# Patient Record
Sex: Male | Born: 1950 | Race: White | Hispanic: No | Smoking: Former smoker
Health system: Southern US, Community
[De-identification: ages and names within clinical notes are randomized; demographics above are authoritative.]

## PROBLEM LIST (undated history)

## (undated) DIAGNOSIS — G629 Polyneuropathy, unspecified: Secondary | ICD-10-CM

## (undated) DIAGNOSIS — G4733 Obstructive sleep apnea (adult) (pediatric): Secondary | ICD-10-CM

## (undated) DIAGNOSIS — N419 Inflammatory disease of prostate, unspecified: Secondary | ICD-10-CM

## (undated) DIAGNOSIS — K2270 Barrett's esophagus without dysplasia: Secondary | ICD-10-CM

## (undated) DIAGNOSIS — N400 Enlarged prostate without lower urinary tract symptoms: Secondary | ICD-10-CM

## (undated) DIAGNOSIS — R600 Localized edema: Secondary | ICD-10-CM

## (undated) DIAGNOSIS — G5600 Carpal tunnel syndrome, unspecified upper limb: Secondary | ICD-10-CM

## (undated) DIAGNOSIS — K219 Gastro-esophageal reflux disease without esophagitis: Secondary | ICD-10-CM

## (undated) DIAGNOSIS — F32A Depression, unspecified: Secondary | ICD-10-CM

## (undated) DIAGNOSIS — N529 Male erectile dysfunction, unspecified: Secondary | ICD-10-CM

## (undated) DIAGNOSIS — F419 Anxiety disorder, unspecified: Secondary | ICD-10-CM

## (undated) DIAGNOSIS — M4800 Spinal stenosis, site unspecified: Secondary | ICD-10-CM

## (undated) DIAGNOSIS — I8390 Asymptomatic varicose veins of unspecified lower extremity: Secondary | ICD-10-CM

## (undated) DIAGNOSIS — M1990 Unspecified osteoarthritis, unspecified site: Secondary | ICD-10-CM

## (undated) DIAGNOSIS — Z973 Presence of spectacles and contact lenses: Secondary | ICD-10-CM

## (undated) DIAGNOSIS — I10 Essential (primary) hypertension: Secondary | ICD-10-CM

## (undated) DIAGNOSIS — R3129 Other microscopic hematuria: Secondary | ICD-10-CM

## (undated) DIAGNOSIS — E559 Vitamin D deficiency, unspecified: Secondary | ICD-10-CM

## (undated) DIAGNOSIS — E785 Hyperlipidemia, unspecified: Secondary | ICD-10-CM

## (undated) DIAGNOSIS — Z8719 Personal history of other diseases of the digestive system: Secondary | ICD-10-CM

## (undated) DIAGNOSIS — I739 Peripheral vascular disease, unspecified: Secondary | ICD-10-CM

## (undated) DIAGNOSIS — F329 Major depressive disorder, single episode, unspecified: Secondary | ICD-10-CM

## (undated) HISTORY — DX: Polyneuropathy, unspecified: G62.9

## (undated) HISTORY — PX: FOOT SURGERY: SHX648

## (undated) HISTORY — DX: Carpal tunnel syndrome, unspecified upper limb: G56.00

## (undated) HISTORY — DX: Vitamin D deficiency, unspecified: E55.9

## (undated) HISTORY — PX: EYE SURGERY: SHX253

## (undated) HISTORY — DX: Depression, unspecified: F32.A

## (undated) HISTORY — DX: Obstructive sleep apnea (adult) (pediatric): G47.33

## (undated) HISTORY — DX: Male erectile dysfunction, unspecified: N52.9

## (undated) HISTORY — DX: Gastro-esophageal reflux disease without esophagitis: K21.9

## (undated) HISTORY — DX: Spinal stenosis, site unspecified: M48.00

## (undated) HISTORY — DX: Anxiety disorder, unspecified: F41.9

## (undated) HISTORY — PX: TONSILLECTOMY: SUR1361

## (undated) HISTORY — PX: PROSTATE BIOPSY: SHX241

## (undated) HISTORY — DX: Other microscopic hematuria: R31.29

## (undated) HISTORY — DX: Inflammatory disease of prostate, unspecified: N41.9

## (undated) HISTORY — PX: LIGATION / DIVISION SAPHENOUS VEIN: SUR828

## (undated) HISTORY — DX: Asymptomatic varicose veins of unspecified lower extremity: I83.90

## (undated) HISTORY — PX: HERNIA REPAIR: SHX51

## (undated) HISTORY — DX: Hyperlipidemia, unspecified: E78.5

## (undated) HISTORY — PX: ARCH BAR REMOVAL: SHX1183

## (undated) HISTORY — DX: Major depressive disorder, single episode, unspecified: F32.9

## (undated) HISTORY — PX: UPPER GASTROINTESTINAL ENDOSCOPY: SHX188

## (undated) HISTORY — DX: Essential (primary) hypertension: I10

---

## 1988-06-23 HISTORY — PX: UVULOPALATOPHARYNGOPLASTY (UPPP)/TONSILLECTOMY/SEPTOPLASTY: SHX6164

## 1990-06-23 HISTORY — PX: ELBOW SURGERY: SHX618

## 2000-06-23 HISTORY — PX: BACK SURGERY: SHX140

## 2012-02-11 ENCOUNTER — Ambulatory Visit: Payer: Self-pay | Admitting: Family Medicine

## 2012-02-18 LAB — HM COLONOSCOPY

## 2012-03-18 ENCOUNTER — Encounter: Payer: Self-pay | Admitting: Internal Medicine

## 2012-03-18 ENCOUNTER — Ambulatory Visit (INDEPENDENT_AMBULATORY_CARE_PROVIDER_SITE_OTHER): Payer: 59 | Admitting: Internal Medicine

## 2012-03-18 VITALS — BP 112/70 | HR 79 | Temp 98.0°F | Ht 69.5 in | Wt 277.0 lb

## 2012-03-18 DIAGNOSIS — I10 Essential (primary) hypertension: Secondary | ICD-10-CM | POA: Insufficient documentation

## 2012-03-18 DIAGNOSIS — R7309 Other abnormal glucose: Secondary | ICD-10-CM

## 2012-03-18 DIAGNOSIS — R739 Hyperglycemia, unspecified: Secondary | ICD-10-CM

## 2012-03-18 DIAGNOSIS — F341 Dysthymic disorder: Secondary | ICD-10-CM

## 2012-03-18 DIAGNOSIS — E559 Vitamin D deficiency, unspecified: Secondary | ICD-10-CM | POA: Insufficient documentation

## 2012-03-18 DIAGNOSIS — G4733 Obstructive sleep apnea (adult) (pediatric): Secondary | ICD-10-CM

## 2012-03-18 DIAGNOSIS — K219 Gastro-esophageal reflux disease without esophagitis: Secondary | ICD-10-CM

## 2012-03-18 DIAGNOSIS — F321 Major depressive disorder, single episode, moderate: Secondary | ICD-10-CM | POA: Insufficient documentation

## 2012-03-18 DIAGNOSIS — F419 Anxiety disorder, unspecified: Secondary | ICD-10-CM | POA: Insufficient documentation

## 2012-03-18 DIAGNOSIS — F329 Major depressive disorder, single episode, unspecified: Secondary | ICD-10-CM | POA: Insufficient documentation

## 2012-03-18 HISTORY — DX: Obstructive sleep apnea (adult) (pediatric): G47.33

## 2012-03-18 HISTORY — DX: Gastro-esophageal reflux disease without esophagitis: K21.9

## 2012-03-18 MED ORDER — METOPROLOL SUCCINATE ER 100 MG PO TB24: 100.0000 mg | ORAL_TABLET | Freq: Every day | ORAL | Status: DC

## 2012-03-18 NOTE — Assessment & Plan Note (Signed)
History of GERD and Barrett's esophagus

## 2012-03-18 NOTE — Progress Notes (Signed)
  Subjective:    Patient ID: Robert Gibbs, male    DOB: 1951/03/19, 61 y.o.   MRN: [id number]  HPI New patient, transferring from [facility] His main concern is lower extremity edema along with tingling and numbness in his foot and toes. The patient reports that this is going on for 2 years, he eventually saw are vascular Dr. and his a schedule to have varicose veins surgery. His previous doctor thought  about neuropathy, apparently a NCS was negative. 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 No apparent distress.  Neck --no thyromegaly  Lungs -- normal respiratory effort, no intercostal retractions, no accessory muscle use, and normal breath sounds.   Heart-- normal rate, regular rhythm, no murmur, and no gallop.   Extremities-- mild edema bilaterally  Neurologic-- alert & oriented X3 and strength normal in all extremities. Psych-- Cognition and  judgment appear intact. Alert and cooperative with normal attention span and concentration.  not anxious appearing and not depressed appearing.       Assessment & Plan:   Patient brought more than 60 pages of records from his health portal, they were reviewed, some of them will be scan: History microcytic anemia History of carpal tunnel syndrome History of RBBB History of vitamin D deficiency CT urogram 2009--nonobstructed left kidney stone, possible lesion right kidney. Had a CT later on that year right lesion was consistent with a cyst. This was part of a hematuria workup by urology. 02/18/2012 biopsies from EGD and a colonoscopy showing Barrett esophagus and unremarkable colon mucosa.  Today , I spent more than 25  min with the patient, >50% of the time counseling, and /or reviewing the old records

## 2012-03-18 NOTE — Assessment & Plan Note (Signed)
Previously intolerant to CPAP ,currently on BiPAP

## 2012-03-18 NOTE — Assessment & Plan Note (Signed)
Currently well controlled.  

## 2012-03-18 NOTE — Assessment & Plan Note (Signed)
Blood pressure currently well controlled with Lasix, lisinopril, amlodipine, toprol The main concern of the patient isLE edema. Recently, amlodipine was decreased from 10 mg to 5 mg without apparent improvement and edema. Plan:  Discontinue amlodipine  leg elevation Increase metoprolol to 100 Consider increase lasix Low Na diet

## 2012-03-18 NOTE — Patient Instructions (Addendum)
Stop amlodipine, increase metoprolol to 100 mg daily  Leg elevation Return in 4 to 6 weeks   3 to 4 Gram Sodium Diet, No Added Salt (NAS) A 3 to 4 gram sodium diet restricts the amount of sodium in the diet to no more than 3 to 4 g or 3000 to 4000 mg daily. Limiting the amount of sodium is often used to help lower blood pressure. It is important if you have heart, liver, or kidney problems. Many foods contain sodium for flavor and sometimes as a preservative. When the amount of sodium in a diet needs to be low, it is important to know what to look for when choosing foods and drinks. The following includes some information and guidelines to help make it easier for you to adapt to a low sodium diet. QUICK TIPS  Do not add salt to food.   Avoid convenience items and fast food.   Choose unsalted snack foods.   Buy lower sodium products, often labeled as "lower sodium" or "no salt added."   Check food labels to learn how much sodium is in 1 serving.   When eating at a restaurant, ask that your food be prepared with less salt or none, if possible.  READING FOOD LABELS FOR SODIUM INFORMATION The nutrition facts label is a good place to find how much sodium is in foods. Look for products with no more than 500 to 600 mg of sodium per meal and no more than 150 mg per serving. Remember that 3 to 4 g = 3000 to 4000 mg. The food label may also list foods as:  Sodium-free: Less than 5 mg in a serving.   Very low sodium: 35 mg or less in a serving.   Low-sodium: 140 mg or less in a serving.   Light in sodium: 50% less sodium in a serving. For example, if a food that usually has 300 mg of sodium is changed to become light in sodium, it will have 150 mg of sodium.   Reduced sodium: 25% less sodium in a serving. For example, if a food that usually has 400 mg of sodium is changed to reduced sodium, it will have 300 mg of sodium.  CHOOSING FOODS Grains  Avoid: Salted crackers and snack items. Bread  stuffing and biscuit mixes. Seasoned rice or pasta mixes.   Choose: Unsalted snack items. English muffins, breads, and rolls. Homemade pancakes and waffles. Most cereals. Pasta.  Meats  Avoid: Salted, canned, smoked, spiced, pickled meats, including fish and poultry. Bacon, ham, sausage, cold cuts, hot dogs, anchovies.   Choose: Low-sodium canned tuna and salmon. Fresh or frozen meat, poultry, and fish.  Dairy  Avoid: Processed cheese and spreads. Cottage cheese. Buttermilk and condensed milk. Regular cheese.   Choose: Milk. Low-sodium cottage cheese. Yogurt. Sour cream. Low-sodium cheese.  Fruits and Vegetables  Avoid: Regular canned vegetables. Regular canned tomato sauce and paste. Frozen vegetables in sauces. Olives. Rosita Fire. Relishes. Sauerkraut.   Choose: Low-sodium canned vegetables. Low-sodium tomato sauce and paste. Frozen or fresh vegetables. Fresh and frozen fruit.  Condiments  Avoid: Canned and packaged gravies. Worcestershire sauce. Tartar sauce. Barbecue sauce. Soy sauce. Steak sauce. Ketchup. Onion, garlic, and table salt. Meat flavorings and tenderizers.   Choose: Fresh and dried herbs and spices. Low-sodium varieties of mustard and ketchup. Lemon juice. Tabasco sauce. Horseradish.  SAMPLE 3 TO 4 GRAM SODIUM MEAL PLAN  Breakfast / Sodium (mg)  1 cup low-fat milk / 143 mg   2 slices  whole-wheat toast / 270 mg   1 tbs heart-healthy margarine / 153 mg   1 hard-boiled egg / 139 mg  Lunch / Sodium (mg)  1 cup raw carrots / 76 mg    cup hummus / 298 mg   1 cup low-fat milk / 143 mg    cup red grapes / 2 mg   1 cup low-sodium chicken and rice soup / 480 mg   10 low-sodium saltine crackers / 191 mg  Dinner / Sodium (mg)  1 cup whole-wheat pasta / 2 mg   1 cup tomato sauce / 1178 mg   3 oz lean ground beef / 57 mg   1 small side salad (1 cup raw spinach leaves,  cup cucumber,  cup yellow bell pepper) / 25 mg   1 tsp ranch dressing / 144 mg  Snack /  Sodium (mg)  1 slice cheddar cheese / 258 mg   1 medium apple / 1 mg  Nutrient Analysis  Calories: 2005   Protein: 85 g   Carbohydrate: 245 g   Fat: 78 g   Sodium: 3560 mg  Document Released: 06/09/2005 Document Revised: 05/29/2011 Document Reviewed: 09/10/2009 [employer] Patient Information 2012 [employer] diet Come back in 4-6 weeks

## 2012-04-27 ENCOUNTER — Ambulatory Visit (INDEPENDENT_AMBULATORY_CARE_PROVIDER_SITE_OTHER): Payer: 59

## 2012-04-27 DIAGNOSIS — Z23 Encounter for immunization: Secondary | ICD-10-CM

## 2012-04-29 ENCOUNTER — Ambulatory Visit: Payer: 59 | Admitting: Internal Medicine

## 2012-05-18 ENCOUNTER — Ambulatory Visit: Payer: 59 | Admitting: Internal Medicine

## 2012-06-08 ENCOUNTER — Encounter: Payer: Self-pay | Admitting: Internal Medicine

## 2012-06-08 ENCOUNTER — Ambulatory Visit (INDEPENDENT_AMBULATORY_CARE_PROVIDER_SITE_OTHER): Payer: 59 | Admitting: Internal Medicine

## 2012-06-08 VITALS — BP 130/84 | HR 70 | Temp 98.0°F | Wt 268.0 lb

## 2012-06-08 DIAGNOSIS — G589 Mononeuropathy, unspecified: Secondary | ICD-10-CM

## 2012-06-08 DIAGNOSIS — M4800 Spinal stenosis, site unspecified: Secondary | ICD-10-CM | POA: Insufficient documentation

## 2012-06-08 DIAGNOSIS — I8390 Asymptomatic varicose veins of unspecified lower extremity: Secondary | ICD-10-CM

## 2012-06-08 DIAGNOSIS — E785 Hyperlipidemia, unspecified: Secondary | ICD-10-CM

## 2012-06-08 DIAGNOSIS — R7309 Other abnormal glucose: Secondary | ICD-10-CM

## 2012-06-08 DIAGNOSIS — I10 Essential (primary) hypertension: Secondary | ICD-10-CM

## 2012-06-08 DIAGNOSIS — R739 Hyperglycemia, unspecified: Secondary | ICD-10-CM

## 2012-06-08 DIAGNOSIS — G629 Polyneuropathy, unspecified: Secondary | ICD-10-CM

## 2012-06-08 DIAGNOSIS — N529 Male erectile dysfunction, unspecified: Secondary | ICD-10-CM | POA: Insufficient documentation

## 2012-06-08 NOTE — Assessment & Plan Note (Addendum)
Since the last office visit, we discontinue amlodipine, he also had varicose vein ablations ---> Edema is better (from med change or varicose vein treatment?) Has not take his ambulatory BPs however BP today is well-controlled. Plan: Continue with present care and check a BMP

## 2012-06-08 NOTE — Assessment & Plan Note (Signed)
Reports he's taking Cialis.

## 2012-06-08 NOTE — Patient Instructions (Addendum)
Next visit in 4-5 months Check the  blood pressure 2 or 3 times a week, be sure it is between 110/60 and 135/80. If it is consistently higher or lower, let me know

## 2012-06-08 NOTE — Assessment & Plan Note (Signed)
Status post varicose ablasions right side 04/28/2012, left-sided 05/10/2012. Left eye was complicated by infection, status post antibiotics, improving

## 2012-06-08 NOTE — Assessment & Plan Note (Addendum)
Reportedly has hyperglycemia, will check an A1c

## 2012-06-08 NOTE — Assessment & Plan Note (Signed)
Reports intolerance to statins, on welchol and  Zetia. Cost is an issue. Samples of welchol provided

## 2012-06-08 NOTE — Progress Notes (Signed)
  Subjective:    Patient ID: Robert Gibbs, male    DOB: 01-26-1951, 61 y.o.   MRN: [id number]  HPI Followup from previous visit. Feeling well.  Past Medical History  Diagnosis Date  . OSA (obstructive sleep apnea) 03/18/2012  . GERD (gastroesophageal reflux disease) 03/18/2012  . Hyperglycemia   . HTN (hypertension)   . Anxiety and depression   . Vitamin D deficiency   . Neuropathy ?   Marland Kitchen Hyperlipidemia   . Erectile dysfunction   . Varicose veins   . 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 No ambulatory BPs, he does follow a low-salt diet. He continue with tingling in his toes and numbness for years. Good compliance with anxiety meds and Wellbutrin. Having  issues affording welchol.     Objective:   Physical Exam General -- alert, well-developed, and overweight appearing. No apparent distress.  Lungs -- normal respiratory effort, no intercostal retractions, no accessory muscle use, and normal breath sounds.   Heart-- normal rate, regular rhythm, no murmur, and no gallop.   Extremities-- trace L>R   pretibial edema   Neurologic-- alert & oriented X3 and strength normal in all extremities. Psych-- Cognition and judgment appear intact. Alert and cooperative with normal attention span and concentration.  not anxious appearing and not depressed appearing.       Assessment & Plan:  At the last visit, he got records from his health  portal but I haven't seen get records from his previous MD.

## 2012-06-08 NOTE — Assessment & Plan Note (Addendum)
Ongoing problems with tingling and numbness in his toes, discomfort years. The patient thinks he had a NCS years ago but he's not sure. Recent varicose veins ablation has not changed his symptoms. Request a neurology referral. We'll arrange

## 2012-06-09 LAB — BASIC METABOLIC PANEL
BUN: 17 mg/dL (ref 6–23)
CO2: 29 mEq/L (ref 19–32)
Chloride: 99 mEq/L (ref 96–112)
Creatinine, Ser: 0.9 mg/dL (ref 0.4–1.5)
Potassium: 3.7 mEq/L (ref 3.5–5.1)

## 2012-06-23 DIAGNOSIS — M4800 Spinal stenosis, site unspecified: Secondary | ICD-10-CM

## 2012-06-23 HISTORY — DX: Spinal stenosis, site unspecified: M48.00

## 2012-07-13 ENCOUNTER — Telehealth: Payer: Self-pay | Admitting: *Deleted

## 2012-07-13 NOTE — Telephone Encounter (Signed)
Pt states that at OV it was discuss a med to treat his neuropathy that would not interfere with his acid reflux med. Pt notes his OV is with neurologist in AM.

## 2012-07-15 NOTE — Telephone Encounter (Signed)
lmovm to return call  °

## 2012-07-15 NOTE — Telephone Encounter (Signed)
Recommend to wait and see what the neurologist has to say

## 2012-07-15 NOTE — Telephone Encounter (Signed)
Spoke to pt, he saw the neurologist yesterday & was prescribed gabapentin 300mg  take 1 tab daily & may gradually increase if needs to. Pt is stated he is going to have an MRI done as well. Pt would like to know if you think gabapentin would interfere with his acid reflux. Please advise.

## 2012-07-15 NOTE — Telephone Encounter (Signed)
1. No interactions 2. Note from neurology review it, they diagnosed with with neuropathy, possible lumbar stenosis-lumbar radiculopathy -peripheral artery disease. They wonder if I ever order a arterial ultrasound to rule out peripheral vascular disease. That may have been checked by his previous PCP. I have no records.

## 2012-07-16 NOTE — Telephone Encounter (Signed)
Discussed with pt

## 2012-08-17 ENCOUNTER — Ambulatory Visit (INDEPENDENT_AMBULATORY_CARE_PROVIDER_SITE_OTHER): Payer: 59 | Admitting: Internal Medicine

## 2012-08-17 ENCOUNTER — Encounter: Payer: Self-pay | Admitting: Internal Medicine

## 2012-08-17 VITALS — BP 142/88 | HR 63 | Temp 98.6°F | Wt 275.0 lb

## 2012-08-17 DIAGNOSIS — M48061 Spinal stenosis, lumbar region without neurogenic claudication: Secondary | ICD-10-CM

## 2012-08-17 DIAGNOSIS — G629 Polyneuropathy, unspecified: Secondary | ICD-10-CM

## 2012-08-17 NOTE — Progress Notes (Signed)
  Subjective:    Patient ID: Robert Gibbs, male    DOB: [birth year], 62 y.o.   MRN: [id number]  HPI Since the last time he was here, he continue with pain in his feet. Today I review the neurology office visit note and the results of the MRI.  Past Medical History  Diagnosis Date  . OSA (obstructive sleep apnea) 03/18/2012  . GERD (gastroesophageal reflux disease) 03/18/2012  . Hyperglycemia   . HTN (hypertension)   . Anxiety and depression   . Vitamin D deficiency   . Spinal stenosis 06-2012    saw neuro w/ "neuropathy", dx w/ spinal stenosis  . Hyperlipidemia   . Erectile dysfunction   . Varicose veins   . CTS (carpal tunnel syndrome)     h/o   No past surgical history on file.   Review of Systems     Objective:   Physical Exam  General -- alert, well-developed   Extremities-- trace pretibial edema bilaterally Normal femoral and pedal pulses Neurologic-- alert & oriented X3 and strength normal in all extremities. Psych-- Cognition and judgment appear intact. Alert and cooperative with normal attention span and concentration.  not anxious appearing and not depressed appearing.       Assessment & Plan:   Today , I spent more than 25 min with the patient, >50% of the time counseling in detail about the dx,  reviewing the chart

## 2012-08-17 NOTE — Assessment & Plan Note (Signed)
Patient went to see neurology for paresthesias of his feet and ankle (?neuropathy), it was felt that he likely had spinal stenosis versus neuropathy. It was felt a;lso that a nerve conduction study will not help much in the decision process. TSH and B12 were normal. MRI was done, see report below. Based on the MRI the patient was told his symptoms were due to spinal stenosis, was prescribed a medication (Neurontin?) and further options were pain management versus surgery. We discussed all of the above patient. Additionally, I noted that he has good femoral and pedal pulses. Plan: Refer to pain management for consideration of local injections. The patient wonders about disability or early retirement on return to the office  MRI 07/22/2012: L4-5 status post laminectomy, no spinal stenosis L3-5 epidural lipomatosis, short pedicles, facet hypertrophy and mild-moderate spinal stenosis and mild biforaminal narrowing.

## 2012-08-17 NOTE — Patient Instructions (Addendum)
Will refer  you to Dr. Jordan Likes Please come back in 3 months for a routine checkup

## 2012-09-14 ENCOUNTER — Telehealth: Payer: Self-pay | Admitting: Internal Medicine

## 2012-09-14 NOTE — Telephone Encounter (Signed)
Several 100s patient is of old records from internal medicine reviewed today, most recent will be scanned, rest will be sent to a paper chart for future reference: Last tetanus shot 2009 10/2007: PSA 4.7 hepatitis B core antigen, hepatitis C and hepatitis B surface antigen negative 06-2010 PSA 3.8, hemoglobin 14.1 09-2010: Stress test  echocardiography normal except for increased BP 09-2011: PSA 2.3 Total cholesterol 140, LDL 82, HDL 38 10-2011: A1c 5.8 Vitamin D a uric acid normal 11-2011: Hemoglobin 12.8. Ferritin, B12, folic acid and iron normal. 01-2012: Lower extremity ultrasound show no peripheral artery disease. He did have venous incompetence.

## 2012-09-15 ENCOUNTER — Telehealth: Payer: Self-pay | Admitting: Internal Medicine

## 2012-09-15 NOTE — Telephone Encounter (Signed)
Patient calling, states in March/2014 he requested medical records be sent from [facility], his prior PCP, to our practice.  I do not see that anything has been scanned in, have you his medical records?  Patient would like a call back.

## 2012-09-15 NOTE — Telephone Encounter (Signed)
Spoke with pt, let him know that Dr. Drue Novel did receive his records & they will be at the front desk ready for him to pick up for safekeeping.

## 2012-09-16 ENCOUNTER — Telehealth: Payer: Self-pay | Admitting: Internal Medicine

## 2012-09-16 MED ORDER — ALPRAZOLAM 0.5 MG PO TABS: 0.5000 mg | ORAL_TABLET | Freq: Every evening | ORAL | Status: DC | PRN

## 2012-09-16 NOTE — Telephone Encounter (Signed)
Ok to refill? Last OV 2.25.14

## 2012-09-16 NOTE — Telephone Encounter (Signed)
Patient called requesting refill on alprazolam 0.5mg  tab #30. He states his last fill was 08/18/12. Dr. Drue Novel has not written this for him before. He uses [facility]

## 2012-09-16 NOTE — Telephone Encounter (Signed)
Done

## 2012-10-13 ENCOUNTER — Ambulatory Visit: Payer: Self-pay | Admitting: Diagnostic Neuroimaging

## 2012-10-14 ENCOUNTER — Telehealth: Payer: Self-pay | Admitting: *Deleted

## 2012-10-14 MED ORDER — BUPROPION HCL ER (XL) 300 MG PO TB24: 300.0000 mg | ORAL_TABLET | Freq: Every day | ORAL | Status: DC

## 2012-10-14 NOTE — Telephone Encounter (Signed)
Refill done.  

## 2012-11-03 ENCOUNTER — Telehealth: Payer: Self-pay | Admitting: General Practice

## 2012-11-03 MED ORDER — OMEPRAZOLE 40 MG PO CPDR: 40.0000 mg | DELAYED_RELEASE_CAPSULE | Freq: Two times a day (BID) | ORAL | Status: DC

## 2012-11-03 NOTE — Telephone Encounter (Signed)
This was received. Med filled to [facility]. Pt notified.

## 2012-11-03 NOTE — Addendum Note (Signed)
Addended by: Jackson Latino on: 11/03/2012 02:32 PM   Modules accepted: Orders

## 2012-11-03 NOTE — Telephone Encounter (Signed)
Pt called wanting our office to be on the lookout for a Renewal Rx from [facility] for his Omeprazole. Pharmacy keeps sending this in to his former provider.

## 2012-11-12 ENCOUNTER — Encounter: Payer: Self-pay | Admitting: Lab

## 2012-11-16 ENCOUNTER — Encounter: Payer: Self-pay | Admitting: Internal Medicine

## 2012-11-16 ENCOUNTER — Ambulatory Visit (INDEPENDENT_AMBULATORY_CARE_PROVIDER_SITE_OTHER): Payer: 59 | Admitting: Internal Medicine

## 2012-11-16 VITALS — BP 138/82 | HR 60 | Temp 98.1°F | Ht 70.5 in | Wt 277.0 lb

## 2012-11-16 DIAGNOSIS — E785 Hyperlipidemia, unspecified: Secondary | ICD-10-CM

## 2012-11-16 DIAGNOSIS — K219 Gastro-esophageal reflux disease without esophagitis: Secondary | ICD-10-CM

## 2012-11-16 DIAGNOSIS — Z0000 Encounter for general adult medical examination without abnormal findings: Secondary | ICD-10-CM

## 2012-11-16 DIAGNOSIS — Z23 Encounter for immunization: Secondary | ICD-10-CM

## 2012-11-16 DIAGNOSIS — M48061 Spinal stenosis, lumbar region without neurogenic claudication: Secondary | ICD-10-CM

## 2012-11-16 LAB — COMPREHENSIVE METABOLIC PANEL
Alkaline Phosphatase: 49 U/L (ref 39–117)
BUN: 16 mg/dL (ref 6–23)
CO2: 27 mEq/L (ref 19–32)
Creatinine, Ser: 0.9 mg/dL (ref 0.4–1.5)
GFR: 95.75 mL/min (ref >60.00)
Glucose, Bld: 104 mg/dL — ABNORMAL HIGH (ref 70–99)
Sodium: 136 mEq/L (ref 135–145)
Total Bilirubin: 0.5 mg/dL (ref 0.3–1.2)

## 2012-11-16 LAB — CBC WITH DIFFERENTIAL/PLATELET
Eosinophils Absolute: 0.2 10*3/uL (ref 0.0–0.7)
HCT: 42 % (ref 39.0–52.0)
Lymphs Abs: 2.6 10*3/uL (ref 0.7–4.0)
MCHC: 35.5 g/dL (ref 30.0–36.0)
MCV: 98 fl (ref 78.0–100.0)
Monocytes Absolute: 0.7 10*3/uL (ref 0.1–1.0)
Neutrophils Relative %: 49.5 % (ref 43.0–77.0)
Platelets: 221 10*3/uL (ref 150.0–400.0)

## 2012-11-16 LAB — LIPID PANEL
Cholesterol: 197 mg/dL (ref 0–200)
HDL: 49.2 mg/dL (ref >39.00)
LDL Cholesterol: 130 mg/dL — ABNORMAL HIGH (ref 0–99)
Triglycerides: 87 mg/dL (ref 0.0–149.0)
VLDL: 17.4 mg/dL (ref 0.0–40.0)

## 2012-11-16 NOTE — Assessment & Plan Note (Signed)
Off welchol x months, off zetia x 6 weeks d/t cost Labs  Consider re challenge w/  Statins

## 2012-11-16 NOTE — Progress Notes (Signed)
  Subjective:    Patient ID: Robert Gibbs, male    DOB: 1950-09-20, 62 y.o.   MRN: [id number]  HPI CPX  Past Medical History  Diagnosis Date  . OSA (obstructive sleep apnea) 03/18/2012    on BiPap  . GERD (gastroesophageal reflux disease) 03/18/2012  . Hyperglycemia   . HTN (hypertension)   . Anxiety and depression   . Vitamin D deficiency   . Spinal stenosis 06-2012    saw neuro w/ "neuropathy", dx w/ spinal stenosis  . Hyperlipidemia   . Erectile dysfunction   . Varicose veins   . CTS (carpal tunnel syndrome)     h/o   Past surgical history B inguinal Hernia repair x2 as a child   Back surgery, L4-L5 Tennis elbow surgery OSA surgery ~ 1990s Plantar fasciitis surgery R Prostate biopsy negative, ~2010  History   Social History  . Marital Status: Divorced    Spouse Name: N/A    Number of Children: 1  . Years of Education: N/A   Occupational History  . [employer]    Social History Main Topics  . Smoking status: Former Games developer  . Smokeless tobacco: Never Used     Comment: quit 2010  . Alcohol Use: Yes     Comment: 2-3 large drinks, Vodka, 2-3 times a week  . Drug Use: No  . Sexually Active: Not on file   Other Topics Concern  . Not on file   Social History Narrative   Has a fiance            Family history Diabetes-- no CAD-- F MI?, GF ++ CAD Stroke-- no Dementia-- M Colon cancer-- no Prostate cancer--no   Review of Systems  Cardiovascular, denies: CP  SOB Palpitations   Admits to mild edema, worse at night GI, denies: Nausea  Vomiting  Diarrhea    Abdominal pain      Admits to occ red blood w/ BMs thinks related to hemorrhoids GU, denies: Dysuria     Gross hematuria  Difficulty urinating      Admits to occ urinary frequency      Objective:   Physical Exam BP 138/82  Pulse 60  Temp(Src) 98.1 F (36.7 C) (Oral)  Ht 5' 10.5" (1.791 m)  Wt 277 lb (125.646 kg)  BMI 39.17 kg/m2  SpO2 96%  General -- alert, well-developed.   Neck --no  thyromegaly  Lungs -- normal respiratory effort, no intercostal retractions, no accessory muscle use, and normal breath sounds.   Heart-- normal rate, regular rhythm, no murmur, and no gallop.   Abdomen--soft, non-tender, no distention, no masses, no HSM, no guarding, and no rigidity.   Extremities-- trace pretibial edema bilaterally  Neurologic-- alert & oriented X3 and strength normal in all extremities. Psych-- Cognition and judgment appear intact. Alert and cooperative with normal attention span and concentration.  not anxious appearing and not depressed appearing.      Assessment & Plan:

## 2012-11-16 NOTE — Assessment & Plan Note (Addendum)
Tdap today zostavax discussed, will check cost w/ his insurance cscope 01-2012, bx neg per old records Sees Dr Pete Glatter (urology) regularly ETOH-- abuse? Feels gilty at times , counseled  Central obesity, high BMI, risk of CV dz, DM discussed, also diet-exercise

## 2012-11-16 NOTE — Assessment & Plan Note (Signed)
Under pain management Dr Jordan Likes

## 2012-11-16 NOTE — Patient Instructions (Addendum)
Think about Zostavax , the shingles shot Next visit in 6 months

## 2012-11-17 ENCOUNTER — Ambulatory Visit (INDEPENDENT_AMBULATORY_CARE_PROVIDER_SITE_OTHER): Payer: 59 | Admitting: *Deleted

## 2012-11-17 DIAGNOSIS — Z23 Encounter for immunization: Secondary | ICD-10-CM

## 2012-11-17 DIAGNOSIS — Z2911 Encounter for prophylactic immunotherapy for respiratory syncytial virus (RSV): Secondary | ICD-10-CM

## 2012-11-23 ENCOUNTER — Other Ambulatory Visit: Payer: Self-pay | Admitting: *Deleted

## 2012-11-23 MED ORDER — FUROSEMIDE 20 MG PO TABS: 20.0000 mg | ORAL_TABLET | Freq: Every day | ORAL | Status: DC

## 2012-11-23 MED ORDER — LISINOPRIL 40 MG PO TABS: 40.0000 mg | ORAL_TABLET | Freq: Every day | ORAL | Status: DC

## 2012-11-23 NOTE — Telephone Encounter (Signed)
Rx sent, Discuss with patient  

## 2012-11-26 ENCOUNTER — Telehealth: Payer: Self-pay | Admitting: *Deleted

## 2012-11-26 NOTE — Telephone Encounter (Signed)
Pt calling and has seen Dr Jordan Likes with pain management and has steroid injections into spine and medication from gabapentin to lyrica has ot really helped.   Asking for recommendations if any from Dr. Marjory Lies.   Will also ask Dr. Jordan Likes.  [phone]

## 2012-11-26 NOTE — Telephone Encounter (Signed)
Pain mgmt per Dr. Jordan Likes. No further recs from me. -VRP

## 2012-11-26 NOTE — Telephone Encounter (Signed)
I called pt to let him know I got his message.  He was taking gabapentin 2400mg  , then switched to lyrica, stated no help at all.  Had only one instance to steroids in back (he staed like 30 injections).

## 2012-11-29 NOTE — Telephone Encounter (Signed)
I called and spoke to pt and relayed that he needs to speak with Dr. Jordan Likes re: his further recommendations, as Dr. Marjory Lies had no other reccs.

## 2012-12-01 ENCOUNTER — Encounter: Payer: Self-pay | Admitting: Internal Medicine

## 2012-12-06 ENCOUNTER — Telehealth: Payer: Self-pay | Admitting: Internal Medicine

## 2012-12-06 ENCOUNTER — Encounter: Payer: Self-pay | Admitting: Internal Medicine

## 2012-12-06 NOTE — Telephone Encounter (Signed)
Please advise 

## 2012-12-06 NOTE — Telephone Encounter (Signed)
Patient Information:  Caller Name: Harlow  Phone: [phone]  Patient: Robert Gibbs, Robert Gibbs  Gender: Male  DOB: [birth year]  Age: 62 Years  PCP: Willow Ora  Office Follow Up:  Does the office need to follow up with this patient?: Yes  Instructions For The Office: Notify when approved. Can ,also, be reached at cell # [phone]   Symptoms  Reason For Call & Symptoms: Garrett states he is on Omeprazole. States he is having esophageal spasms with chest pain x one week. Per chest pain protocol has 911 disposition due to chest pain lasting longer than 5 minutes and age > 74 years old. Declines 911. States he has had a cardiac workup that was negative. Kendrew states he can only take Omeprazole from the manufacturer " Dr. Reddy's "via [facility]. States last refill was Omeprazole  from [facility] which does not relieve his chest pain.Taking Pepcid complete one daily until Omeprazole refilled.  [facility] requesting physician authorization to fill prescription of Omeprazole manufactured by " Dr. Reddy's"  only.  Torell requesting  refill of Omeprazole by Dr. Reddy's ASAP with 90 day supply.  Reviewed Health History In EMR: Yes  Reviewed Medications In EMR: Yes  Reviewed Allergies In EMR: Yes  Reviewed Surgeries / Procedures: Yes  Date of Onset of Symptoms: 11/29/2012  Treatments Tried: Pepcid complete once daily  Treatments Tried Worked: Yes  Guideline(s) Used:  Chest Pain  Disposition Per Guideline:   Call EMS 911 Now  Reason For Disposition Reached:   Chest pain lasting longer than 5 minutes and ANY of the following:  Over 76 years old Over 65 years old and at least one cardiac risk factor (i.e., high blood pressure, diabetes, high cholesterol, obesity, smoker or strong family history of heart disease) Pain is crushing, pressure-like, or heavy  Took nitroglycerin and chest pain was not relieved History of heart disease (i.e., angina, heart attack, bypass surgery, angioplasty,  CHF)  Advice Given:  Call Back If:  Severe chest pain  You become worse.  Patient Refused Recommendation:  Patient Requests Prescription  States he needs a new prescription for Omeprazole made by Dr. Jae Dire only from [facility]. States he is currently taking Omeprazole made by [facility] and is having esophageal spasms. [facility] requesting physician authorization for specific manufacturer. Has had this problem in the past with manufacturer but [facility] requesting authorization again.

## 2012-12-07 MED ORDER — OMEPRAZOLE 40 MG PO CPDR: 40.0000 mg | DELAYED_RELEASE_CAPSULE | Freq: Two times a day (BID) | ORAL | Status: DC

## 2012-12-07 NOTE — Telephone Encounter (Signed)
Spoke to pt, he states he is no longer have chest pain. Pt states it was due to [facility] sending him the wrong med.  Refill done for prilosec.

## 2012-12-07 NOTE — Telephone Encounter (Signed)
(  Chart review, had CP, (-) stress test and cardiac cath 2013 ) Advise pt: If chest pain is ongoing, needs to be seen. Okay to refill Omeprazole following patient specifications

## 2012-12-27 ENCOUNTER — Telehealth: Payer: Self-pay | Admitting: Internal Medicine

## 2012-12-27 MED ORDER — ALPRAZOLAM 0.5 MG PO TABS: 0.5000 mg | ORAL_TABLET | Freq: Every evening | ORAL | Status: DC | PRN

## 2012-12-27 NOTE — Telephone Encounter (Signed)
Patient called requesting new rx for alprazolam 0.5mg . He states his pharmacy told him he had to call us. [facility] uses [facility]

## 2012-12-27 NOTE — Telephone Encounter (Signed)
done

## 2012-12-27 NOTE — Telephone Encounter (Signed)
Ok to refill? Last OV 5.27.14 Last filled 3.27.14

## 2012-12-31 ENCOUNTER — Other Ambulatory Visit: Payer: Self-pay | Admitting: Internal Medicine

## 2012-12-31 NOTE — Telephone Encounter (Signed)
rx was previously sent to wrong pharmacy, [facility]

## 2013-01-04 ENCOUNTER — Other Ambulatory Visit: Payer: Self-pay | Admitting: Internal Medicine

## 2013-01-04 NOTE — Telephone Encounter (Signed)
Refill done per protocol.  

## 2013-02-23 ENCOUNTER — Encounter: Payer: Self-pay | Admitting: Internal Medicine

## 2013-02-23 ENCOUNTER — Ambulatory Visit (INDEPENDENT_AMBULATORY_CARE_PROVIDER_SITE_OTHER): Payer: 59 | Admitting: Internal Medicine

## 2013-02-23 VITALS — BP 152/82 | HR 70 | Temp 98.9°F | Wt 284.6 lb

## 2013-02-23 DIAGNOSIS — M25569 Pain in unspecified knee: Secondary | ICD-10-CM

## 2013-02-23 DIAGNOSIS — M25561 Pain in right knee: Secondary | ICD-10-CM

## 2013-02-23 LAB — URIC ACID: Uric Acid, Serum: 6.8 mg/dL (ref 4.0–7.8)

## 2013-02-23 NOTE — Assessment & Plan Note (Signed)
One-month history of bilateral knee pain, the left knee has evidence of mild effusion. Gout? DJD? Plan: Check uric acid Refer to ortho

## 2013-02-23 NOTE — Patient Instructions (Addendum)
Get your blood work before you leave  Ice twice a day Rest

## 2013-02-23 NOTE — Progress Notes (Signed)
  Subjective:    Patient ID: Robert Gibbs, male    DOB: May 06, 1951, 62 y.o.   MRN: [id number]  Knee Pain   Acute visit One month history of bilateral knee pain  sometimes worse in the right, sometimes worse in the left. The pain is present at rest, increase with walking or going up stairs and is in the frontal aspect  the knees.  Past Medical History  Diagnosis Date  . OSA (obstructive sleep apnea) 03/18/2012    on BiPap  . GERD (gastroesophageal reflux disease) 03/18/2012  . Hyperglycemia   . HTN (hypertension)   . Anxiety and depression   . Vitamin D deficiency   . Spinal stenosis 06-2012    saw neuro w/ "neuropathy", dx w/ spinal stenosis  . Hyperlipidemia   . Erectile dysfunction   . Varicose veins   . CTS (carpal tunnel syndrome)     h/o   Past Surgical History  Procedure Laterality Date  . Hernia repair      x 2 as a child  . Back surgery      L4-L5  . Elbow surgery      for tennis elbow  . Osa surgery      ~1990  . Foot surgery      R, for plantar fasciitis  . Prostate biopsy      2010, (-)     Review of Systems Denies any redness, injury. Mild swelling on and off. No pain or swelling in any other ways such as it rolls, wrists fingers.     Objective:   Physical Exam BP 152/82  Pulse 70  Temp(Src) 98.9 F (37.2 C) (Oral)  Wt 284 lb 9.6 oz (129.094 kg)  BMI 40.25 kg/m2  SpO2 96%  General -- alert, well-developed, NAD.   Extremities-- trace pretibial edema bilaterally  Right knee-- Range of motion normal, no red or warm Left knee--mild effusion on physical exam, mild warmness no redness. Range of motion:forced flexion limited. No deformities. Neurologic-- alert & oriented X3. Speech, gait normal.  Psych-- Cognition and judgment appear intact. Alert and cooperative with normal attention span and concentration. not anxious appearing and not depressed appearing.      Assessment & Plan:

## 2013-02-24 ENCOUNTER — Encounter: Payer: Self-pay | Admitting: Internal Medicine

## 2013-02-25 ENCOUNTER — Telehealth: Payer: Self-pay | Admitting: General Practice

## 2013-02-25 NOTE — Telephone Encounter (Signed)
unfortunately they hardly ever sample [place] w/ Cialis or similar meds

## 2013-02-25 NOTE — Telephone Encounter (Signed)
Pt called wanting to know if we have any cialis samples. Pt states that this Rx had been originally prescribed by Dr. Pete Glatter for prostate issues. Please advise.   Last OV 02-23-13

## 2013-02-25 NOTE — Telephone Encounter (Signed)
Noted will notify pt  

## 2013-03-01 ENCOUNTER — Encounter: Payer: Self-pay | Admitting: Internal Medicine

## 2013-03-03 ENCOUNTER — Telehealth: Payer: Self-pay | Admitting: Internal Medicine

## 2013-03-03 NOTE — Telephone Encounter (Signed)
03/03/2013  Pt called to schedule a 3 month follow up..  While talking to him, he has been to an orthopedic Dr that he will see again in 3 weeks.  He stated that fluid was drawn from left knee and given a cortizone shot in both knees.  He has had some relief, but mild reaction from injections.  Reations stated are mild fever aprox 2 days, face red, and blood pressure feels to be higher, and pressure in head. He also states he can see veins in head.  Please advise if pt needs to be seen sooner.  No appt has been made yet.  bw

## 2013-03-04 ENCOUNTER — Telehealth: Payer: Self-pay

## 2013-03-04 NOTE — Telephone Encounter (Signed)
Noted, thx.

## 2013-03-04 NOTE — Telephone Encounter (Signed)
Notified pt via telephone of s/s to report to the ER. Pt states feels a lot better since his earlier phone call, checked his b/p has improved 138/70.DJR

## 2013-03-04 NOTE — Telephone Encounter (Signed)
Dr Gearlean Alf Blood Pressure  138/70 sitting L arm

## 2013-03-04 NOTE — Telephone Encounter (Signed)
Since this phone call, he W I the  office , BP was check and was normal. Call pt : If he has redness around the knee, fever, chills, leg swelling: needs to go to the ER.

## 2013-03-24 ENCOUNTER — Ambulatory Visit (INDEPENDENT_AMBULATORY_CARE_PROVIDER_SITE_OTHER): Payer: 59 | Admitting: *Deleted

## 2013-03-24 DIAGNOSIS — Z23 Encounter for immunization: Secondary | ICD-10-CM

## 2013-03-28 ENCOUNTER — Other Ambulatory Visit: Payer: Self-pay | Admitting: *Deleted

## 2013-03-28 DIAGNOSIS — G609 Hereditary and idiopathic neuropathy, unspecified: Secondary | ICD-10-CM

## 2013-03-28 MED ORDER — GABAPENTIN 800 MG PO TABS: 800.0000 mg | ORAL_TABLET | Freq: Four times a day (QID) | ORAL | Status: DC

## 2013-03-28 NOTE — Telephone Encounter (Signed)
Refill for neurontin sent to [facility]

## 2013-03-31 ENCOUNTER — Other Ambulatory Visit: Payer: Self-pay | Admitting: *Deleted

## 2013-03-31 DIAGNOSIS — G609 Hereditary and idiopathic neuropathy, unspecified: Secondary | ICD-10-CM

## 2013-03-31 MED ORDER — GABAPENTIN 800 MG PO TABS: 800.0000 mg | ORAL_TABLET | Freq: Four times a day (QID) | ORAL | Status: DC

## 2013-04-11 ENCOUNTER — Other Ambulatory Visit: Payer: Self-pay | Admitting: Internal Medicine

## 2013-04-11 NOTE — Telephone Encounter (Signed)
Bupropion and Metoprolol refills sent to pharmacy

## 2013-04-13 ENCOUNTER — Other Ambulatory Visit: Payer: Self-pay | Admitting: Orthopedic Surgery

## 2013-04-29 ENCOUNTER — Telehealth: Payer: Self-pay | Admitting: Internal Medicine

## 2013-04-29 ENCOUNTER — Encounter (HOSPITAL_BASED_OUTPATIENT_CLINIC_OR_DEPARTMENT_OTHER): Payer: Self-pay | Admitting: *Deleted

## 2013-04-29 NOTE — Telephone Encounter (Signed)
Patient recently had a procedures done on his knee's and is wanting to know if he can be given a script for a walker to help him get around. He would also like to know if Dr. Drue Novel is aware of a place where he can rent a walker. Please advise.

## 2013-04-29 NOTE — Telephone Encounter (Signed)
Please advise 

## 2013-04-29 NOTE — Telephone Encounter (Signed)
The best thing to do is to call Dr. Turner Daniels, his knee surgeon, they may known were to rent a walker

## 2013-04-29 NOTE — Progress Notes (Signed)
Discussed possibly getting a walker-to come in for bmet-ekg-uses a bipap-to bring all meds,bipap,overnight bag in case he needs to stay-

## 2013-05-02 ENCOUNTER — Encounter (HOSPITAL_BASED_OUTPATIENT_CLINIC_OR_DEPARTMENT_OTHER)
Admission: RE | Admit: 2013-05-02 | Discharge: 2013-05-02 | Disposition: A | Discharge status: Home / Self Care | Payer: 59 | Source: Ambulatory Visit | Attending: Orthopedic Surgery | Admitting: Orthopedic Surgery

## 2013-05-02 LAB — BASIC METABOLIC PANEL
BUN: 14 mg/dL (ref 6–23)
Calcium: 9.6 mg/dL (ref 8.4–10.5)
Chloride: 103 mEq/L (ref 96–112)
Creatinine, Ser: 0.8 mg/dL (ref 0.50–1.35)
GFR calc non Af Amer: >90 mL/min (ref >90)
Glucose, Bld: 108 mg/dL — ABNORMAL HIGH (ref 70–99)

## 2013-05-02 NOTE — Telephone Encounter (Signed)
Called and spoke a patient regarding walker. He stated that the order is no longer needed because he is borrowing one from a friend.

## 2013-05-03 NOTE — H&P (Signed)
Robert Gibbs is an 62 y.o. male.   Chief Complaint: Bilateral Knee Pain  HPI: Patient presents with a chief complaint of right greater than left knee pain for the last 6 weeks with swelling on the left, but more pain on the right.  The patient is 62 years old.  He doesn't recall any recent or distant trauma.  He reports that the pain is now constant moderate to severe with a sharp and aching quality there is associated swelling and some weakness.  Activity makes the pain worse.  Ice and elevation seem to improve it.  Advil as not helped at all and it does tend to bother his stomach.  He denies any previous injuries to his knees.  The pain is along the medial side of both knees.  Has an occasional catching or clicking sensation, especially attempts to squat, which also reproduces the pain.  Past Medical History  Diagnosis Date  . GERD (gastroesophageal reflux disease) 03/18/2012  . Hyperglycemia   . HTN (hypertension)   . Anxiety and depression   . Vitamin D deficiency   . Spinal stenosis 06-2012    saw neuro w/ "neuropathy", dx w/ spinal stenosis  . Hyperlipidemia   . Erectile dysfunction   . Varicose veins   . CTS (carpal tunnel syndrome)     h/o  . Barrett esophagus   . Wears glasses   . OSA (obstructive sleep apnea) 03/18/2012    on BiPap    Past Surgical History  Procedure Laterality Date  . Elbow surgery  1992    for tennis elbow-rt  . Osa surgery      ~1990-uvul  . Foot surgery      R, for plantar fasciitis  . Prostate biopsy      2010, (-)  . Back surgery  2002    L4-L5  . Hernia repair      x 2 as a child-ing   . Uvulopalatopharyngoplasty (uppp)/tonsillectomy/septoplasty  1990  . Colonoscopy    . Upper gastrointestinal endoscopy    . Arch bar removal    . Ligation / division saphenous vein      both legs    History reviewed. No pertinent family history. Social History:  reports that he quit smoking about 4 years ago. He has never used smokeless tobacco. He  reports that he drinks alcohol. He reports that he does not use illicit drugs.  Allergies:  Allergies  Allergen Reactions  . Codeine Itching  . Nsaids   . Simvastatin     No prescriptions prior to admission    Results for orders placed during the hospital encounter of 05/04/13 (from the past 48 hour(s))  BASIC METABOLIC PANEL     Status: Abnormal   Collection Time    05/02/13  3:00 PM      Result Value Range   Sodium 142  135 - 145 mEq/L   Potassium 4.4  3.5 - 5.1 mEq/L   Chloride 103  96 - 112 mEq/L   CO2 29  19 - 32 mEq/L   Glucose, Bld 108 (*) 70 - 99 mg/dL   BUN 14  6 - 23 mg/dL   Creatinine, Ser 1.61  0.50 - 1.35 mg/dL   Calcium 9.6  8.4 - 09.6 mg/dL   GFR calc non Af Amer >90  >90 mL/min   GFR calc Af Amer >90  >90 mL/min   Comment: (NOTE)     The eGFR has been calculated using the CKD EPI equation.  This calculation has not been validated in all clinical situations.     eGFR's persistently <90 mL/min signify possible Chronic Kidney     Disease.   No results found.  Review of Systems  Constitutional: Negative.   HENT: Negative.   Eyes: Positive for blurred vision (wears glasses).  Respiratory: Negative.   Cardiovascular: Negative.   Gastrointestinal: Negative.   Genitourinary: Negative.   Musculoskeletal: Positive for joint pain.  Skin: Negative.   Neurological: Negative.   Endo/Heme/Allergies: Negative.   Psychiatric/Behavioral: Positive for depression.    Height 5\' 10"  (1.778 m), weight 128.368 kg (283 lb). Physical Exam  Constitutional: He is oriented to person, place, and time. He appears well-developed and well-nourished.  HENT:  Head: Normocephalic and atraumatic.  Eyes: Pupils are equal, round, and reactive to light.  Neck: Normal range of motion.  Cardiovascular: Intact distal pulses.   Respiratory: Effort normal.  Musculoskeletal:  Tender along the medial joint line of both knees, both knees have 5 flexion contractures.  Both knees flex  125.  The left knee does have a 1+ effusion the right knee, no effusion.  McMurray's test reproduces pain with one plus crepitus on the right.  No crepitus on the left.  Collateral ligaments are stable Lachman's test is negative.  Neurological: He is alert and oriented to person, place, and time. He has normal reflexes.  Skin: Skin is warm and dry.  Psychiatric: He has a normal mood and affect. His behavior is normal. Judgment and thought content normal.     Radiographs:  X-rays were ordered, performed, and interpreted by me today included; standing AP, Rosenberg, lateral and sunrise x-rays of both knees showed Fairbanks grade 1 changes, loss of articular cartilage 1 mm to the medial side of both knees.  The right knee has loss of 2-3 mm of cartilage the medial facet region of the patella.  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  Plan: Options were discussed at length.  The patient has chosen cortisone injections.The patient was given a prescription for physical therapy to work on stretching, strengthening, range of motion.  He'll return in 3 weeks for consideration of arthroscopic meniscectomies of his pain persists, if his pain remains 90% better.  He'll come back when necessary.As the patient's symptoms persisted he is elected to proceed with bilateral knee scopes.  He is made aware the benefits risks and potential complications of bilateral procedures.  He wishes to proceed.  A posting slip is completed on the patient and he discuss scheduling with Agustin Cree.  PHILLIPS, ERIC R 05/03/2013, 12:40 PM

## 2013-05-04 ENCOUNTER — Ambulatory Visit (HOSPITAL_BASED_OUTPATIENT_CLINIC_OR_DEPARTMENT_OTHER)
Admission: RE | Admit: 2013-05-04 | Discharge: 2013-05-04 | Disposition: A | Discharge status: Home / Self Care | Payer: 59 | Source: Ambulatory Visit | Attending: Orthopedic Surgery | Admitting: Orthopedic Surgery

## 2013-05-04 ENCOUNTER — Encounter (HOSPITAL_BASED_OUTPATIENT_CLINIC_OR_DEPARTMENT_OTHER): Payer: 59 | Admitting: Anesthesiology

## 2013-05-04 ENCOUNTER — Encounter (HOSPITAL_BASED_OUTPATIENT_CLINIC_OR_DEPARTMENT_OTHER)
Admission: RE | Disposition: A | Discharge status: Home / Self Care | Payer: Self-pay | Source: Ambulatory Visit | Attending: Orthopedic Surgery

## 2013-05-04 ENCOUNTER — Encounter (HOSPITAL_BASED_OUTPATIENT_CLINIC_OR_DEPARTMENT_OTHER): Payer: Self-pay | Admitting: Anesthesiology

## 2013-05-04 ENCOUNTER — Ambulatory Visit (HOSPITAL_BASED_OUTPATIENT_CLINIC_OR_DEPARTMENT_OTHER): Payer: 59 | Admitting: Anesthesiology

## 2013-05-04 DIAGNOSIS — K219 Gastro-esophageal reflux disease without esophagitis: Secondary | ICD-10-CM | POA: Insufficient documentation

## 2013-05-04 DIAGNOSIS — R7309 Other abnormal glucose: Secondary | ICD-10-CM | POA: Insufficient documentation

## 2013-05-04 DIAGNOSIS — S83207A Unspecified tear of unspecified meniscus, current injury, left knee, initial encounter: Secondary | ICD-10-CM

## 2013-05-04 DIAGNOSIS — M23302 Other meniscus derangements, unspecified lateral meniscus, unspecified knee: Secondary | ICD-10-CM | POA: Insufficient documentation

## 2013-05-04 DIAGNOSIS — E559 Vitamin D deficiency, unspecified: Secondary | ICD-10-CM | POA: Insufficient documentation

## 2013-05-04 DIAGNOSIS — F411 Generalized anxiety disorder: Secondary | ICD-10-CM | POA: Insufficient documentation

## 2013-05-04 DIAGNOSIS — F329 Major depressive disorder, single episode, unspecified: Secondary | ICD-10-CM | POA: Insufficient documentation

## 2013-05-04 DIAGNOSIS — Z885 Allergy status to narcotic agent status: Secondary | ICD-10-CM | POA: Insufficient documentation

## 2013-05-04 DIAGNOSIS — G4733 Obstructive sleep apnea (adult) (pediatric): Secondary | ICD-10-CM | POA: Insufficient documentation

## 2013-05-04 DIAGNOSIS — I10 Essential (primary) hypertension: Secondary | ICD-10-CM | POA: Insufficient documentation

## 2013-05-04 DIAGNOSIS — M2240 Chondromalacia patellae, unspecified knee: Secondary | ICD-10-CM | POA: Insufficient documentation

## 2013-05-04 DIAGNOSIS — M23329 Other meniscus derangements, posterior horn of medial meniscus, unspecified knee: Secondary | ICD-10-CM | POA: Insufficient documentation

## 2013-05-04 DIAGNOSIS — E785 Hyperlipidemia, unspecified: Secondary | ICD-10-CM | POA: Insufficient documentation

## 2013-05-04 DIAGNOSIS — M4800 Spinal stenosis, site unspecified: Secondary | ICD-10-CM | POA: Insufficient documentation

## 2013-05-04 DIAGNOSIS — S83206A Unspecified tear of unspecified meniscus, current injury, right knee, initial encounter: Secondary | ICD-10-CM

## 2013-05-04 DIAGNOSIS — F3289 Other specified depressive episodes: Secondary | ICD-10-CM | POA: Insufficient documentation

## 2013-05-04 HISTORY — DX: Barrett's esophagus without dysplasia: K22.70

## 2013-05-04 HISTORY — DX: Presence of spectacles and contact lenses: Z97.3

## 2013-05-04 HISTORY — PX: KNEE ARTHROSCOPY WITH MENISCAL REPAIR: SHX5653

## 2013-05-04 SURGERY — ARTHROSCOPY, KNEE, WITH MENISCUS REPAIR
Anesthesia: General | Site: Knee | Laterality: Bilateral | Wound class: Clean

## 2013-05-04 MED ORDER — DEXTROSE-NACL 5-0.45 % IV SOLN: INTRAVENOUS | Status: DC

## 2013-05-04 MED ORDER — LIDOCAINE HCL (PF) 1 % IJ SOLN
INTRAMUSCULAR | Status: AC
  Filled 2013-05-04: qty 30

## 2013-05-04 MED ORDER — OXYCODONE HCL 5 MG/5ML PO SOLN: 5.0000 mg | Freq: Once | ORAL | Status: DC | PRN

## 2013-05-04 MED ORDER — LACTATED RINGERS IV SOLN
INTRAVENOUS | Status: DC
  Administered 2013-05-04 (×2): via INTRAVENOUS

## 2013-05-04 MED ORDER — HYDROMORPHONE HCL PF 1 MG/ML IJ SOLN: 0.2500 mg | INTRAMUSCULAR | Status: DC | PRN

## 2013-05-04 MED ORDER — BUPIVACAINE-EPINEPHRINE 0.5% -1:200000 IJ SOLN
INTRAMUSCULAR | Status: DC | PRN
  Administered 2013-05-04: 30 mL

## 2013-05-04 MED ORDER — BUPIVACAINE HCL (PF) 0.5 % IJ SOLN
INTRAMUSCULAR | Status: AC
  Filled 2013-05-04: qty 30

## 2013-05-04 MED ORDER — CHLORHEXIDINE GLUCONATE 4 % EX LIQD: 60.0000 mL | Freq: Once | CUTANEOUS | Status: DC

## 2013-05-04 MED ORDER — FENTANYL CITRATE 0.05 MG/ML IJ SOLN: 50.0000 ug | INTRAMUSCULAR | Status: DC | PRN

## 2013-05-04 MED ORDER — EPINEPHRINE HCL 1 MG/ML IJ SOLN
INTRAMUSCULAR | Status: AC
  Filled 2013-05-04: qty 1

## 2013-05-04 MED ORDER — MIDAZOLAM HCL 5 MG/5ML IJ SOLN
INTRAMUSCULAR | Status: DC | PRN
  Administered 2013-05-04: 2 mg via INTRAVENOUS

## 2013-05-04 MED ORDER — PROMETHAZINE HCL 25 MG/ML IJ SOLN
6.2500 mg | INTRAMUSCULAR | Status: DC | PRN
  Administered 2013-05-04: 6.25 mg via INTRAVENOUS

## 2013-05-04 MED ORDER — BUPIVACAINE-EPINEPHRINE PF 0.5-1:200000 % IJ SOLN
INTRAMUSCULAR | Status: AC
  Filled 2013-05-04: qty 30

## 2013-05-04 MED ORDER — DEXAMETHASONE SODIUM PHOSPHATE 4 MG/ML IJ SOLN
INTRAMUSCULAR | Status: DC | PRN
  Administered 2013-05-04: 10 mg via INTRAVENOUS

## 2013-05-04 MED ORDER — PROPOFOL 10 MG/ML IV BOLUS
INTRAVENOUS | Status: DC | PRN
  Administered 2013-05-04: 25 mg via INTRAVENOUS

## 2013-05-04 MED ORDER — SODIUM CHLORIDE 0.9 % IR SOLN
Status: DC | PRN
  Administered 2013-05-04: 6000 mL

## 2013-05-04 MED ORDER — CEFAZOLIN SODIUM-DEXTROSE 2-3 GM-% IV SOLR
2.0000 g | INTRAVENOUS | Status: AC
  Administered 2013-05-04: 2 g via INTRAVENOUS

## 2013-05-04 MED ORDER — OXYCODONE HCL 5 MG PO TABS: 5.0000 mg | ORAL_TABLET | Freq: Once | ORAL | Status: DC | PRN

## 2013-05-04 MED ORDER — FENTANYL CITRATE 0.05 MG/ML IJ SOLN
INTRAMUSCULAR | Status: AC
  Filled 2013-05-04: qty 8

## 2013-05-04 MED ORDER — MIDAZOLAM HCL 2 MG/2ML IJ SOLN: 1.0000 mg | INTRAMUSCULAR | Status: DC | PRN

## 2013-05-04 MED ORDER — HYDROCODONE-ACETAMINOPHEN 5-325 MG PO TABS: 1.0000 | ORAL_TABLET | Freq: Four times a day (QID) | ORAL | Status: DC | PRN

## 2013-05-04 MED ORDER — PROMETHAZINE HCL 25 MG/ML IJ SOLN
INTRAMUSCULAR | Status: AC
  Filled 2013-05-04: qty 1

## 2013-05-04 MED ORDER — LIDOCAINE HCL (CARDIAC) 20 MG/ML IV SOLN
INTRAVENOUS | Status: DC | PRN
  Administered 2013-05-04: 100 mg via INTRAVENOUS

## 2013-05-04 MED ORDER — ONDANSETRON HCL 4 MG/2ML IJ SOLN
INTRAMUSCULAR | Status: DC | PRN
  Administered 2013-05-04: 4 mg via INTRAVENOUS

## 2013-05-04 MED ORDER — MIDAZOLAM HCL 2 MG/2ML IJ SOLN
INTRAMUSCULAR | Status: AC
  Filled 2013-05-04: qty 2

## 2013-05-04 MED ORDER — MEPERIDINE HCL 25 MG/ML IJ SOLN: 6.2500 mg | INTRAMUSCULAR | Status: DC | PRN

## 2013-05-04 MED ORDER — EPINEPHRINE HCL 1 MG/ML IJ SOLN
INTRAMUSCULAR | Status: DC | PRN
  Administered 2013-05-04: 1 mg via ENDOTRACHEOPULMONARY

## 2013-05-04 MED ORDER — FENTANYL CITRATE 0.05 MG/ML IJ SOLN
INTRAMUSCULAR | Status: DC | PRN
  Administered 2013-05-04 (×4): 50 ug via INTRAVENOUS
  Administered 2013-05-04: 100 ug via INTRAVENOUS

## 2013-05-04 SURGICAL SUPPLY — 43 items
BANDAGE ELASTIC 6 VELCRO ST LF (GAUZE/BANDAGES/DRESSINGS) ×4 IMPLANT
BLADE 4.2CUDA (BLADE) IMPLANT
BLADE CUTTER GATOR 3.5 (BLADE) ×2 IMPLANT
BLADE GREAT WHITE 4.2 (BLADE) IMPLANT
CANISTER SUCT 3000ML (MISCELLANEOUS) IMPLANT
CANISTER SUCT LVC 12 LTR MEDI- (MISCELLANEOUS) IMPLANT
CHLORAPREP W/TINT 26ML (MISCELLANEOUS) ×4 IMPLANT
DRAPE ARTHROSCOPY W/POUCH 114 (DRAPES) ×4 IMPLANT
DRAPE U 20/CS (DRAPES) ×2 IMPLANT
DRSG PAD ABDOMINAL 8X10 ST (GAUZE/BANDAGES/DRESSINGS) ×2 IMPLANT
ELECT MENISCUS 165MM 90D (ELECTRODE) IMPLANT
ELECT REM PT RETURN 9FT ADLT (ELECTROSURGICAL)
ELECTRODE REM PT RTRN 9FT ADLT (ELECTROSURGICAL) IMPLANT
GAUZE XEROFORM 1X8 LF (GAUZE/BANDAGES/DRESSINGS) ×4 IMPLANT
GLOVE BIO SURGEON STRL SZ7.5 (GLOVE) ×2 IMPLANT
GLOVE BIO SURGEON STRL SZ8.5 (GLOVE) ×2 IMPLANT
GLOVE BIOGEL PI IND STRL 8 (GLOVE) ×1 IMPLANT
GLOVE BIOGEL PI IND STRL 9 (GLOVE) ×1 IMPLANT
GLOVE BIOGEL PI INDICATOR 8 (GLOVE) ×1
GLOVE BIOGEL PI INDICATOR 9 (GLOVE) ×1
GLOVE ECLIPSE 6.5 STRL STRAW (GLOVE) ×2 IMPLANT
GOWN PREVENTION PLUS XLARGE (GOWN DISPOSABLE) ×4 IMPLANT
GOWN PREVENTION PLUS XXLARGE (GOWN DISPOSABLE) ×2 IMPLANT
IV NS IRRIG 3000ML ARTHROMATIC (IV SOLUTION) ×4 IMPLANT
KNEE WRAP E Z 3 GEL PACK (MISCELLANEOUS) ×4 IMPLANT
NDL SAFETY ECLIPSE 18X1.5 (NEEDLE) ×1 IMPLANT
NEEDLE HYPO 18GX1.5 SHARP (NEEDLE) ×1
NEEDLE MENISCAL REPAIR DBL ARM (NEEDLE) IMPLANT
NEEDLE MENISCAL REPAIR W/EYELT (NEEDLE) IMPLANT
PACK ARTHROSCOPY DSU (CUSTOM PROCEDURE TRAY) ×2 IMPLANT
PACK BASIN DAY SURGERY FS (CUSTOM PROCEDURE TRAY) ×2 IMPLANT
PAD ALCOHOL SWAB (MISCELLANEOUS) ×2 IMPLANT
PENCIL BUTTON HOLSTER BLD 10FT (ELECTRODE) IMPLANT
SET ARTHROSCOPY TUBING (MISCELLANEOUS) ×1
SET ARTHROSCOPY TUBING LN (MISCELLANEOUS) ×1 IMPLANT
SLEEVE SCD COMPRESS KNEE MED (MISCELLANEOUS) IMPLANT
SPONGE GAUZE 4X4 12PLY (GAUZE/BANDAGES/DRESSINGS) ×4 IMPLANT
STOCKINETTE 4X48 STRL (DRAPES) ×2 IMPLANT
SYR 3ML 18GX1 1/2 (SYRINGE) IMPLANT
SYR 5ML LL (SYRINGE) ×2 IMPLANT
TOWEL OR 17X24 6PK STRL BLUE (TOWEL DISPOSABLE) ×2 IMPLANT
WAND STAR VAC 90 (SURGICAL WAND) IMPLANT
WATER STERILE IRR 1000ML POUR (IV SOLUTION) ×2 IMPLANT

## 2013-05-04 NOTE — Anesthesia Preprocedure Evaluation (Addendum)
Anesthesia Evaluation  Patient identified by MRN, date of birth, ID band Patient awake    Reviewed: Allergy & Precautions, H&P , NPO status , Patient's Chart, lab work & pertinent test results, reviewed documented beta blocker date and time   History of Anesthesia Complications (+) history of anesthetic complications  Airway Mallampati: II TM Distance: >3 FB Neck ROM: Full    Dental  (+) Missing and Dental Advisory Given   Pulmonary sleep apnea and Continuous Positive Airway Pressure Ventilation , former smoker (quit '10),    Pulmonary exam normal       Cardiovascular hypertension, Pt. on medications and Pt. on home beta blockers - anginaRhythm:Regular Rate:Normal  '12 stress test: no ischemai, EF 75%   Neuro/Psych negative neurological ROS     GI/Hepatic Neg liver ROS, GERD-  Medicated and Controlled,  Endo/Other  Morbid obesity  Renal/GU negative Renal ROS     Musculoskeletal   Abdominal (+) + obese,   Peds  Hematology   Anesthesia Other Findings   Reproductive/Obstetrics                         Anesthesia Physical Anesthesia Plan  ASA: III  Anesthesia Plan: General   Post-op Pain Management:    Induction: Intravenous  Airway Management Planned: LMA  Additional Equipment:   Intra-op Plan:   Post-operative Plan:   Informed Consent: I have reviewed the patients History and Physical, chart, labs and discussed the procedure including the risks, benefits and alternatives for the proposed anesthesia with the patient or authorized representative who has indicated his/her understanding and acceptance.   Dental advisory given  Plan Discussed with: CRNA and Surgeon  Anesthesia Plan Comments: (Plan routine monitors, GA- LMA Ok)       Anesthesia Quick Evaluation

## 2013-05-04 NOTE — Op Note (Signed)
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 On the left grade 3 chondromalacia flap tears medial femoral condyle near the notch and a distally   Procedure: Bilateral knee arthroscopies with medial meniscectomies removal of loose bodies debridement chondromalacia and on the right abrasion arthroplasty appear  Surgeon: Feliberto Gottron. Turner Daniels M.D.  Assist: Tomi Likens. Gaylene Brooks  (present throughout entire procedure and necessary for timely completion of the procedure) Anes: General LMA  EBL: Minimal  Fluids: 800 cc   Indications: Patient has catching popping and pain in right and left knee he is also had effusions on the left side or drains and injected with cortisone that worked temporarily appear x-ray show loss of articular cartilage fight 1-2 mm to the medial compartment as well as patellofemoral arthritis. McMurray's test reproduces pain with a click on the right not on the left. Pt has failed conservative treatment with anti-inflammatory medicines, physical therapy, and modified activites but did get good temporarily from an intra-articular cortisone injection. Pain has recurred and patient desires elective arthroscopic evaluation and treatment of bilateral knee. Risks and benefits of surgery have been discussed and questions answered.  Procedure: Patient identified by arm band and taken to the operating room at the day surgery Center. The appropriate anesthetic monitors were attached, and General LMA anesthesia was induced without difficulty. Lateral post was applied to the table and the lower extremity was prepped and draped in usual sterile fashion from the ankle to  the midthigh. Time out procedure was performed. We began the operation on the right knee by making standard inferior lateral and inferior medial peripatellar portals with a #11 blade allowing introduction of the arthroscope through the inferior lateral portal and the out flow to the inferior medial portal. Pump pressure was set at 100 mmHg and diagnostic arthroscopy  revealed grade 2 chondromalacia of the apex of the patella moving to grade 4 chondromalacia to the medial facet the trochlea had grade 3 chondromalacia with flap tears and over the anterior aspect of the medial femoral condyle grade 4 chondromalacia. The bare bone areas underwent abrasion arthroplasty. The medial compartment had complex tearing of the medial meniscus from straight medial to the posterior horn as well as grade 3 chondromalacia to the distal aspect and posterior aspects of the medial femoral condyle the anterior cruciate ligament and PCL are intact on the lateral side the patient had an area of grade 3 chondromalacia 5 x 5 mm is debrider back and smoothed. There is an incidental tearing of the lateral meniscus on the right side and this was also debrided. We then irrigated the right knee with normal saline solution and removed the arthroscopic instruments. We then began on the left by making standard inferomedial inferolateral portals, and the arthroscope was introduced the infralateral portal and the out flow to the inferomedial portal. Diagnostic arthroscopy revealed a relatively normal patellofemoral joint with some mild grade 2 chondromalacia of the trochlea. Moving into the medial compartment on the left side we identified complex tearing of the medial meniscus is debrider back to a stable margin again with a 35 Gator sucker shaver as well as a straight biter. There is also grade 3 chondromalacia of the medial femoral  condyle near the notch with flap tears likewise debrided. The anterior cruciate ligament and PCL are intact. On the  lateral side the articular and meniscal cartilages were in good condition. The knee was irrigated out normal saline solution. A dressing of xerofoam 4 x 4 dressing sponges, web roll and an Ace wrap was applied to each knee. The patient was awakened extubated and taken to the recovery without difficulty.    Signed: Nestor Lewandowsky, MD

## 2013-05-04 NOTE — Interval H&P Note (Signed)
History and Physical Interval Note:  05/04/2013 11:33 AM  Robert Gibbs  has presented today for surgery, with the diagnosis of BILATERAL KNEE CHONDROMALACIA DEGENERATIVE MENISCUS TEARS   The various methods of treatment have been discussed with the patient and family. After consideration of risks, benefits and other options for treatment, the patient has consented to  Procedure(s): BILATERAL KNEE ARTHROSCOPY WITH POSSIBLE CHONDROPLASTY AND MENISCECTOMY  (Bilateral) as a surgical intervention .  The patient's history has been reviewed, patient examined, no change in status, stable for surgery.  I have reviewed the patient's chart and labs.  Questions were answered to the patient's satisfaction.     Nestor Lewandowsky

## 2013-05-04 NOTE — Anesthesia Procedure Notes (Signed)
Procedure Name: LMA Insertion Date/Time: 05/04/2013 11:50 AM Performed by: Caren Macadam Pre-anesthesia Checklist: Patient identified, Emergency Drugs available, Suction available and Patient being monitored Patient Re-evaluated:Patient Re-evaluated prior to inductionOxygen Delivery Method: Circle System Utilized Preoxygenation: Pre-oxygenation with 100% oxygen Intubation Type: IV induction Ventilation: Mask ventilation without difficulty LMA: LMA inserted LMA Size: 5.0 Number of attempts: 1 Airway Equipment and Method: bite block Placement Confirmation: positive ETCO2 and breath sounds checked- equal and bilateral Tube secured with: Tape Dental Injury: Teeth and Oropharynx as per pre-operative assessment

## 2013-05-04 NOTE — Transfer of Care (Signed)
Immediate Anesthesia Transfer of Care Note  Patient: Robert Gibbs  Procedure(s) Performed: Procedure(s) with comments: BILATERAL KNEE ARTHROSCOPY WITH POSSIBLE CHONDROPLASTY AND MENISCECTOMY  (Bilateral) - Right chrondroplasty, debridement, , partial medial menisectomy, removal of loose bodies. Left partial medial menisectomy, chondroplasty   Patient Location: PACU  Anesthesia Type:General  Level of Consciousness: awake and alert   Airway & Oxygen Therapy: Patient Spontanous Breathing and Patient connected to face mask oxygen  Post-op Assessment: Report given to PACU RN and Post -op Vital signs reviewed and stable  Post vital signs: Reviewed and stable  Complications: No apparent anesthesia complications

## 2013-05-04 NOTE — Anesthesia Postprocedure Evaluation (Signed)
Anesthesia Post Note  Patient: Robert Gibbs  Procedure(s) Performed: Procedure(s) (LRB): BILATERAL KNEE ARTHROSCOPY WITH POSSIBLE CHONDROPLASTY AND MENISCECTOMY  (Bilateral)  Anesthesia type: General  Patient location: PACU  Post pain: Pain level controlled and Adequate analgesia  Post assessment: Post-op Vital signs reviewed, Patient's Cardiovascular Status Stable, Respiratory Function Stable, Patent Airway and Pain level controlled  Last Vitals:  Filed Vitals:   05/04/13 1242  BP:   Pulse: 87  Temp:   Resp: 17    Post vital signs: Reviewed and stable  Level of consciousness: awake, alert  and oriented  Complications: No apparent anesthesia complications

## 2013-05-05 ENCOUNTER — Encounter (HOSPITAL_BASED_OUTPATIENT_CLINIC_OR_DEPARTMENT_OTHER): Payer: Self-pay | Admitting: Orthopedic Surgery

## 2013-06-18 ENCOUNTER — Other Ambulatory Visit: Payer: Self-pay | Admitting: Internal Medicine

## 2013-06-20 NOTE — Telephone Encounter (Signed)
Lisinopril refilled per protocol. JG//CMA 

## 2013-07-07 ENCOUNTER — Telehealth: Payer: Self-pay | Admitting: Internal Medicine

## 2013-07-07 MED ORDER — ALPRAZOLAM 0.5 MG PO TABS: ORAL_TABLET | ORAL | Status: DC

## 2013-07-07 NOTE — Addendum Note (Signed)
Addended by: Kathlene November E on: 07/07/2013 02:11 PM   Modules accepted: Orders

## 2013-07-07 NOTE — Telephone Encounter (Signed)
done

## 2013-07-07 NOTE — Telephone Encounter (Signed)
PT STATES HE HAS BEEN TRYING TO GET HIS ALPRAZOLAM .5 MG FILLED FOR A COUPLE WEEKS. I DO NOT SEE ANYTHING IN HIS CHART. HE IS REQUESTING A SMALL QUANTITY AT [facility] [address] AND 90 DAY SUPPLY TO [facility] Cb# [phone]

## 2013-07-07 NOTE — Telephone Encounter (Signed)
rx refill- Alprazolam .5mg  Last OV- 02/23/13 Last refilled - 12/31/12 #90 / 1 rf  Last UDS- 11/16/12 LOW

## 2013-07-11 ENCOUNTER — Telehealth: Payer: Self-pay | Admitting: *Deleted

## 2013-07-11 MED ORDER — ALPRAZOLAM 0.5 MG PO TBDP
0.5000 mg | ORAL_TABLET | Freq: Every evening | ORAL | Status: DC | PRN
Start: 2013-07-11 — End: 2013-10-24

## 2013-07-11 NOTE — Telephone Encounter (Signed)
Patient called and stated that he requested a 30 day supply (Xanx) be sent to a local pharmacy until [facility] sends the medication. [facility].

## 2013-10-20 ENCOUNTER — Other Ambulatory Visit: Payer: Self-pay | Admitting: Orthopedic Surgery

## 2013-10-21 ENCOUNTER — Other Ambulatory Visit: Payer: Self-pay | Admitting: Internal Medicine

## 2013-10-24 ENCOUNTER — Telehealth: Payer: Self-pay | Admitting: Internal Medicine

## 2013-10-24 MED ORDER — ALPRAZOLAM 0.5 MG PO TABS: ORAL_TABLET | ORAL | Status: DC

## 2013-10-24 NOTE — Telephone Encounter (Signed)
Xanax 0.5mg   Last OV- 02/23/13 Last refilled- 07/01/13 #30 / 0 rf  UDS- 11/16/12 LOW

## 2013-10-24 NOTE — Telephone Encounter (Signed)
Okay to refill. He is due for a physical exam, please arrange.

## 2013-10-24 NOTE — Telephone Encounter (Signed)
Caller name: Stran Raper  Relation to YS:AYTK Call back number: [phone] Pharmacy: [facility]  Reason for call: Patient called stating [facility] received rx for bupropion and metoprolol, but did not get rx for alprazolam. I do not see that we received a request, however pt states he is almost out.

## 2013-10-25 ENCOUNTER — Encounter (HOSPITAL_COMMUNITY): Payer: Self-pay | Admitting: Pharmacy Technician

## 2013-10-25 NOTE — Telephone Encounter (Signed)
rx faxed to pts [facility]. Pt scheduled for CPE

## 2013-10-28 ENCOUNTER — Encounter (HOSPITAL_COMMUNITY): Payer: Self-pay

## 2013-10-28 ENCOUNTER — Ambulatory Visit (HOSPITAL_COMMUNITY)
Admission: RE | Admit: 2013-10-28 | Discharge: 2013-10-28 | Disposition: A | Discharge status: Home / Self Care | Payer: 59 | Source: Ambulatory Visit | Attending: Orthopedic Surgery | Admitting: Orthopedic Surgery

## 2013-10-28 ENCOUNTER — Encounter (HOSPITAL_COMMUNITY)
Admission: RE | Admit: 2013-10-28 | Discharge: 2013-10-28 | Disposition: A | Discharge status: Home / Self Care | Payer: 59 | Source: Ambulatory Visit | Attending: Orthopedic Surgery | Admitting: Orthopedic Surgery

## 2013-10-28 DIAGNOSIS — Z01818 Encounter for other preprocedural examination: Secondary | ICD-10-CM | POA: Insufficient documentation

## 2013-10-28 DIAGNOSIS — Z01810 Encounter for preprocedural cardiovascular examination: Secondary | ICD-10-CM | POA: Insufficient documentation

## 2013-10-28 DIAGNOSIS — Z01812 Encounter for preprocedural laboratory examination: Secondary | ICD-10-CM | POA: Insufficient documentation

## 2013-10-28 HISTORY — DX: Unspecified osteoarthritis, unspecified site: M19.90

## 2013-10-28 HISTORY — DX: Localized edema: R60.0

## 2013-10-28 HISTORY — DX: Benign prostatic hyperplasia without lower urinary tract symptoms: N40.0

## 2013-10-28 HISTORY — DX: Personal history of other diseases of the digestive system: Z87.19

## 2013-10-28 LAB — BASIC METABOLIC PANEL
BUN: 16 mg/dL (ref 6–23)
CALCIUM: 9.4 mg/dL (ref 8.4–10.5)
CO2: 26 mEq/L (ref 19–32)
CREATININE: 0.8 mg/dL (ref 0.50–1.35)
Chloride: 102 mEq/L (ref 96–112)
GFR calc Af Amer: >90 mL/min (ref >90)
Glucose, Bld: 96 mg/dL (ref 70–99)
Potassium: 4.9 mEq/L (ref 3.7–5.3)
Sodium: 141 mEq/L (ref 137–147)

## 2013-10-28 LAB — CBC WITH DIFFERENTIAL/PLATELET
Basophils Absolute: 0 10*3/uL (ref 0.0–0.1)
Basophils Relative: 0 % (ref 0–1)
Eosinophils Absolute: 0.2 10*3/uL (ref 0.0–0.7)
Eosinophils Relative: 3 % (ref 0–5)
HCT: 40.6 % (ref 39.0–52.0)
Hemoglobin: 14 g/dL (ref 13.0–17.0)
LYMPHS PCT: 40 % (ref 12–46)
Lymphs Abs: 2.9 10*3/uL (ref 0.7–4.0)
MCH: 34.7 pg — ABNORMAL HIGH (ref 26.0–34.0)
MCHC: 34.5 g/dL (ref 30.0–36.0)
MCV: 100.5 fL — ABNORMAL HIGH (ref 78.0–100.0)
Monocytes Absolute: 0.8 10*3/uL (ref 0.1–1.0)
Monocytes Relative: 11 % (ref 3–12)
NEUTROS ABS: 3.3 10*3/uL (ref 1.7–7.7)
Neutrophils Relative %: 46 % (ref 43–77)
PLATELETS: 188 10*3/uL (ref 150–400)
RBC: 4.04 MIL/uL — ABNORMAL LOW (ref 4.22–5.81)
RDW: 13.1 % (ref 11.5–15.5)
WBC: 7.3 10*3/uL (ref 4.0–10.5)

## 2013-10-28 LAB — TYPE AND SCREEN
ABO/RH(D): O NEG
ANTIBODY SCREEN: NEGATIVE

## 2013-10-28 LAB — SURGICAL PCR SCREEN
MRSA, PCR: NEGATIVE
STAPHYLOCOCCUS AUREUS: NEGATIVE

## 2013-10-28 LAB — URINALYSIS, ROUTINE W REFLEX MICROSCOPIC
BILIRUBIN URINE: NEGATIVE
GLUCOSE, UA: NEGATIVE mg/dL
Hgb urine dipstick: NEGATIVE
Ketones, ur: NEGATIVE mg/dL
Nitrite: NEGATIVE
PROTEIN: NEGATIVE mg/dL
SPECIFIC GRAVITY, URINE: 1.019 (ref 1.005–1.030)
UROBILINOGEN UA: 0.2 mg/dL (ref 0.0–1.0)
pH: 7 (ref 5.0–8.0)

## 2013-10-28 LAB — APTT: APTT: 30 s (ref 24–37)

## 2013-10-28 LAB — PROTIME-INR
INR: 0.92 (ref 0.00–1.49)
PROTHROMBIN TIME: 12.2 s (ref 11.6–15.2)

## 2013-10-28 LAB — URINE MICROSCOPIC-ADD ON

## 2013-10-28 LAB — ABO/RH: ABO/RH(D): O NEG

## 2013-10-28 NOTE — Pre-Procedure Instructions (Signed)
BENNO BRENSINGER  10/28/2013   Your procedure is scheduled on:  Monday May 18 th at 0730 AM  Report to [facility]" at 0530 AM.  Call this number if you have problems the morning of surgery: [phone]   Remember:   Do not eat food or drink liquids after midnight Sunday 11/06/13 .   Take these medicines the morning of surgery with A SIP OF WATER: Bupropion (Wellbutrin), Gabapentin (Neurontin), Pain medication if needed, Metoprolol (Toprol-XL), and Omeprazole (Prilosec)   Stop Aspirin, Vitamins, Coenzyme Q10, Herbal meds, and NSAIDs 5 days prior to surgery.   Do not wear jewelry.  Do not wear lotions, powders, or perfumes. You may wear deodorant.             Men may shave face and neck.  Do not bring valuables to the hospital.  [facility] is not responsible  for any belongings or valuables.               Contacts, dentures or bridgework may not be worn into surgery.  Leave suitcase in the car. After surgery it may be brought to your room.  For patients admitted to the hospital, discharge time is determined by your treatment team.               Patients discharged the day of surgery will not be allowed to drive home.    Special Instructions: [place] - Preparing for Surgery  Before surgery, you can play an important role.  Because skin is not sterile, your skin needs to be as free of germs as possible.  You can reduce the number of germs on you skin by washing with CHG (chlorahexidine gluconate) soap before surgery.  CHG is an antiseptic cleaner which kills germs and bonds with the skin to continue killing germs even after washing.  Please DO NOT use if you have an allergy to CHG or antibacterial soaps.  If your skin becomes reddened/irritated stop using the CHG and inform your nurse when you arrive at Short Stay.  Do not shave (including legs and underarms) for at least 48 hours prior to the first CHG shower.  You may shave your face.  Please follow these  instructions carefully:   1.  Shower with CHG Soap the night before surgery and the                                morning of Surgery.  2.  If you choose to wash your hair, wash your hair first as usual with your       normal shampoo.  3.  After you shampoo, rinse your hair and body thoroughly to remove the                      Shampoo.  4.  Use CHG as you would any other liquid soap.  You can apply chg directly       to the skin and wash gently with scrungie or a clean washcloth.  5.  Apply the CHG Soap to your body ONLY FROM THE NECK DOWN.        Do not use on open wounds or open sores.  Avoid contact with your eyes,       ears, mouth and genitals (private parts).  Wash genitals (private parts)       with your normal soap.  6.  Wash thoroughly, paying special attention to the area where your surgery        will be performed.  7.  Thoroughly rinse your body with warm water from the neck down.  8.  DO NOT shower/wash with your normal soap after using and rinsing off       the CHG Soap.  9.  Pat yourself dry with a clean towel.            10.  Wear clean pajamas.            11.  Place clean sheets on your bed the night of your first shower and do not        sleep with pets.  Day of Surgery  Do not apply any lotions/deoderants the morning of surgery.  Please wear clean clothes to the hospital/surgery center.      Please read over the following fact sheets that you were given: Pain Booklet, Coughing and Deep Breathing, Blood Transfusion Information, MRSA Information and Surgical Site Infection Prevention

## 2013-11-03 ENCOUNTER — Telehealth: Payer: Self-pay

## 2013-11-03 NOTE — Progress Notes (Signed)
Request for sleep study refaxed to [facility] as they did not have previous one at this time.

## 2013-11-03 NOTE — Telephone Encounter (Signed)
Medication List and allergies:  Reviewed and updated  90 day supply/mail order:  [facility] Local prescriptions: [facility] [address]  Immunizations due: PNA/shingles  A/P:   No changes to FH, PSH or Personal Hx Flu vaccine--03/2013 Tdap--2014 [facility]--cscope 01-2012, bx neg per old records PSA--09/2011--2.36-urology--last OV 12/2012  To Discuss with Provider: Not at this time

## 2013-11-04 ENCOUNTER — Encounter: Payer: Self-pay | Admitting: Internal Medicine

## 2013-11-04 ENCOUNTER — Ambulatory Visit (INDEPENDENT_AMBULATORY_CARE_PROVIDER_SITE_OTHER): Payer: 59 | Admitting: Internal Medicine

## 2013-11-04 VITALS — BP 150/81 | HR 65 | Temp 98.0°F | Ht 70.0 in | Wt 278.0 lb

## 2013-11-04 DIAGNOSIS — F329 Major depressive disorder, single episode, unspecified: Secondary | ICD-10-CM

## 2013-11-04 DIAGNOSIS — I10 Essential (primary) hypertension: Secondary | ICD-10-CM

## 2013-11-04 DIAGNOSIS — F419 Anxiety disorder, unspecified: Secondary | ICD-10-CM

## 2013-11-04 DIAGNOSIS — R600 Localized edema: Secondary | ICD-10-CM

## 2013-11-04 DIAGNOSIS — R739 Hyperglycemia, unspecified: Secondary | ICD-10-CM

## 2013-11-04 DIAGNOSIS — Z0000 Encounter for general adult medical examination without abnormal findings: Secondary | ICD-10-CM

## 2013-11-04 DIAGNOSIS — F32A Depression, unspecified: Secondary | ICD-10-CM

## 2013-11-04 LAB — LIPID PANEL
Cholesterol: 204 mg/dL — ABNORMAL HIGH (ref 0–200)
HDL: 42.2 mg/dL (ref >39.00)
LDL Cholesterol: 138 mg/dL — ABNORMAL HIGH (ref 0–99)
TRIGLYCERIDES: 119 mg/dL (ref 0.0–149.0)
Total CHOL/HDL Ratio: 5
VLDL: 23.8 mg/dL (ref 0.0–40.0)

## 2013-11-04 LAB — AST: AST: 28 U/L (ref 0–37)

## 2013-11-04 LAB — ALT: ALT: 42 U/L (ref 0–53)

## 2013-11-04 LAB — HEMOGLOBIN A1C: HEMOGLOBIN A1C: 5.5 % (ref 4.6–6.5)

## 2013-11-04 NOTE — Assessment & Plan Note (Signed)
Chronic bilateral mild lower extremity edema, recommend leg elevation low-salt intake.

## 2013-11-04 NOTE — Assessment & Plan Note (Signed)
Will check the A1c, diet discussed

## 2013-11-04 NOTE — Patient Instructions (Signed)
Get your blood work before you leave   Nasocort OTC 2 sprays on each side of the nose at night to prevent frequent throat clearing  Next visit is for routine check up in 3 months   Call any time if the anxiety-depression is worse

## 2013-11-04 NOTE — Assessment & Plan Note (Addendum)
Tdap 2014 zostavax discussed before  cscope 01-2012, bx neg per old records Sees Dr Felipa Eth (urology) regularly ETOH-- abuse? Feels gilty at times about drinking,again counseled , rec to let his orthopedic doctor know how muchetoh he drinks Discussed  diet-exercise

## 2013-11-04 NOTE — Assessment & Plan Note (Addendum)
Symptoms were well-controlled before however he lost a 7-week-old granson a year ago and that is affecting him. He also lost his father 4 years ago and that is still an issue. Not doing counseling ---> he is counseled here today. He is going to have a total knee replacement in few days, I don't think it is the right time to introduce new medications. I did ask him to call anytime if symptoms increase, come back in 3 months to reassess. Denies suicidal ideas

## 2013-11-04 NOTE — Progress Notes (Signed)
Subjective:    Patient ID: Robert Gibbs, male    DOB: 1951/02/07, 63 y.o.   MRN: [id number]  DOS:  11/04/2013 Type of  visit: CPX We also discussed other issues, see a/p To have a TKR next week   ROS Denies chest pain, shortness or breath or orthopnea. No  nausea, vomiting, diarrhea or blood in the stools. No cough or sputum production. Depressed but denies suicidal ideas.    Past Medical History  Diagnosis Date  . GERD (gastroesophageal reflux disease) 03/18/2012  . HTN (hypertension)   . Anxiety and depression   . Vitamin D deficiency   . Spinal stenosis 06-2012    saw neuro w/ "neuropathy", dx w/ spinal stenosis  . Hyperlipidemia   . Erectile dysfunction   . Varicose veins   . CTS (carpal tunnel syndrome)     h/o  . Barrett esophagus   . Wears glasses   . OSA (obstructive sleep apnea) 03/18/2012    on BiPap  . Diabetes mellitus without complication     prediabetic  . H/O hiatal hernia   . Headache(784.0)     sinus  . Arthritis   . BPH (benign prostatic hyperplasia)   . Edema of both legs     legs , ankles and feet    Past Surgical History  Procedure Laterality Date  . Elbow surgery  1992    for tennis elbow-rt  . Foot surgery      R, for plantar fasciitis  . Prostate biopsy      2010, (-)  . Back surgery  2002    L4-L5  . Hernia repair      x 2 as a child-ing   . Uvulopalatopharyngoplasty (uppp)/tonsillectomy/septoplasty  1990  . Colonoscopy    . Upper gastrointestinal endoscopy    . Arch bar removal    . Ligation / division saphenous vein      both legs  . Knee arthroscopy with meniscal repair Bilateral 05/04/2013    Procedure: BILATERAL KNEE ARTHROSCOPY WITH POSSIBLE CHONDROPLASTY AND MENISCECTOMY ;  Surgeon: Kerin Salen, MD;  Location: [place];  Service: Orthopedics;  Laterality: Bilateral;  Right chrondroplasty, debridement, , partial medial menisectomy, removal of loose bodies.   . Tonsillectomy    . Eye surgery Right       cataract with lens implant    History   Social History  . Marital Status: Divorced    Spouse Name: N/A    Number of Children: 1  . Years of Education: N/A   Occupational History  . [employer]    Social History Main Topics  . Smoking status: Former Smoker -- 1.00 packs/day for 40 years    Quit date: 06/29/2008  . Smokeless tobacco: Never Used     Comment: quit 2010  . Alcohol Use: Yes     Comment: 2-3 large drinks, Vodka, 2-3 times a week  . Drug Use: No  . Sexual Activity: Not on file   Other Topics Concern  . Not on file   Social History Narrative   Lives w/  fiance              Family History  Problem Relation Age of Onset  . Colon cancer Neg Hx   . Prostate cancer Neg Hx   . CAD Father     ?  . Diabetes Neg Hx       Medication List       This list is accurate  as of: 11/04/13 11:59 PM.  Always use your most recent med list.               ALPRAZolam 0.5 MG tablet  Commonly known as:  XANAX  Take 0.5 mg by mouth at bedtime as needed for sleep.     aspirin 81 MG tablet  Take 81 mg by mouth daily.     b complex vitamins capsule  Take 1 capsule by mouth daily.     buPROPion 300 MG 24 hr tablet  Commonly known as:  WELLBUTRIN XL  Take 300 mg by mouth daily.     CALCIUM 1200 1200-1000 MG-UNIT Chew  Chew 1 tablet by mouth 2 (two) times daily.     Coenzyme Q10 200 MG capsule  Take 200 mg by mouth daily.     FLEX OMEGA BENEFITS/VIT D-3 PO  Take 2 capsules by mouth daily.     furosemide 20 MG tablet  Commonly known as:  LASIX  Take 1 tablet (20 mg total) by mouth daily.     gabapentin 800 MG tablet  Commonly known as:  NEURONTIN  Take 1 tablet (800 mg total) by mouth 4 (four) times daily.     HYDROcodone-acetaminophen 5-325 MG per tablet  Commonly known as:  NORCO  Take 1 tablet by mouth every 6 (six) hours as needed.     lisinopril 40 MG tablet  Commonly known as:  PRINIVIL,ZESTRIL  Take 40 mg by mouth daily.     magnesium oxide 400 MG  tablet  Commonly known as:  MAG-OX  Take 400 mg by mouth daily.     Melatonin 5 MG Caps  Take 2 capsules by mouth at bedtime. Patient stated that he is taking 10 mg daily     metoprolol succinate 100 MG 24 hr tablet  Commonly known as:  TOPROL-XL  Take 100 mg by mouth daily. Take with or immediately following a meal.     multivitamin with minerals tablet  Take 1 tablet by mouth daily.     omeprazole 40 MG capsule  Commonly known as:  PRILOSEC  Take 1 capsule (40 mg total) by mouth 2 (two) times daily.     PRESCRIPTION MEDICATION  Apply 1 application topically 4 (four) times daily as needed (for pain).     PROBIOTIC DAILY PO  Take 1 capsule by mouth daily.     vitamin C 1000 MG tablet  Take 1,000 mg by mouth daily.           Objective:   Physical Exam BP 150/81  Pulse 65  Temp(Src) 98 F (36.7 C)  Ht 5\' 10"  (1.778 m)  Wt 278 lb (126.1 kg)  BMI 39.89 kg/m2  SpO2 96%  General -- alert, well-developed, NAD.  HEENT-- Not pale.   Lungs -- normal respiratory effort, no intercostal retractions, no accessory muscle use, and normal breath sounds.  Heart-- normal rate, regular rhythm, no murmur.  Abdomen-- Not distended, good bowel sounds,soft, non-tender. Extremities-- trace  pretibial edema bilaterally  Neurologic--  alert & oriented X3. Speech normal, gait normal, strength normal in all extremities.  Psych-- Cognition and judgment appear intact. Cooperative with normal attention span and concentration. Sad when talked about his g-son.       Assessment & Plan:

## 2013-11-04 NOTE — Assessment & Plan Note (Signed)
BP slightly elevated today but was better a few days ago when he did his preop.No change

## 2013-11-04 NOTE — Progress Notes (Signed)
Pre visit review using our clinic review tool, if applicable. No additional management support is needed unless otherwise documented below in the visit note. 

## 2013-11-05 NOTE — H&P (Signed)
TOTAL KNEE ADMISSION H&P  Patient is being admitted for right total knee arthroplasty.  Subjective:  Chief Complaint:right knee pain.  HPI: Robert Gibbs, 63 y.o. male, has a history of pain and functional disability in the right knee due to arthritis and has failed non-surgical conservative treatments for greater than 12 weeks to includeNSAID's and/or analgesics, corticosteriod injections, viscosupplementation injections, supervised PT with diminished ADL's post treatment, weight reduction as appropriate and activity modification.  Onset of symptoms was gradual, starting 2 years ago with gradually worsening course since that time. The patient noted prior procedures on the knee to include  arthroscopy on the bilaterally knee(s).  Patient currently rates pain in the right knee(s) at 10 out of 10 with activity. Patient has night pain, worsening of pain with activity and weight bearing, pain that interferes with activities of daily living, pain with passive range of motion, crepitus and joint swelling.  Patient has evidence of joint space narrowing by imaging studies. There is no active infection.  Patient Active Problem List   Diagnosis Date Noted  . Leg edema 11/04/2013  . Knee pain, bilateral 02/23/2013  . Annual physical exam 11/16/2012  . Spinal stenosis of lumbar region 06/08/2012  . Varicose veins  06/08/2012  . Hyperlipidemia 06/08/2012  . Erectile dysfunction 06/08/2012  . HTN (hypertension) 03/18/2012  . OSA (obstructive sleep apnea) 03/18/2012  . GERD (gastroesophageal reflux disease) 03/18/2012  . Hyperglycemia 03/18/2012  . Anxiety and depression 03/18/2012  . Vitamin D deficiency 03/18/2012   Past Medical History  Diagnosis Date  . GERD (gastroesophageal reflux disease) 03/18/2012  . HTN (hypertension)   . Anxiety and depression   . Vitamin D deficiency   . Spinal stenosis 06-2012    saw neuro w/ "neuropathy", dx w/ spinal stenosis  . Hyperlipidemia   . Erectile  dysfunction   . Varicose veins   . CTS (carpal tunnel syndrome)     h/o  . Barrett esophagus   . Wears glasses   . OSA (obstructive sleep apnea) 03/18/2012    on BiPap  . Diabetes mellitus without complication     prediabetic  . H/O hiatal hernia   . Headache(784.0)     sinus  . Arthritis   . BPH (benign prostatic hyperplasia)   . Edema of both legs     legs , ankles and feet    Past Surgical History  Procedure Laterality Date  . Elbow surgery  1992    for tennis elbow-rt  . Foot surgery      R, for plantar fasciitis  . Prostate biopsy      2010, (-)  . Back surgery  2002    L4-L5  . Hernia repair      x 2 as a child-ing   . Uvulopalatopharyngoplasty (uppp)/tonsillectomy/septoplasty  1990  . Colonoscopy    . Upper gastrointestinal endoscopy    . Arch bar removal    . Ligation / division saphenous vein      both legs  . Knee arthroscopy with meniscal repair Bilateral 05/04/2013    Procedure: BILATERAL KNEE ARTHROSCOPY WITH POSSIBLE CHONDROPLASTY AND MENISCECTOMY ;  Surgeon: Kerin Salen, MD;  Location: [place];  Service: Orthopedics;  Laterality: Bilateral;  Right chrondroplasty, debridement, , partial medial menisectomy, removal of loose bodies.   . Tonsillectomy    . Eye surgery Right     cataract with lens implant    No prescriptions prior to admission   Allergies  Allergen Reactions  .  Codeine Itching  . Nsaids     Acid Reflux  . Simvastatin     Mimics heart attack    History  Substance Use Topics  . Smoking status: Former Smoker -- 1.00 packs/day for 40 years    Quit date: 06/29/2008  . Smokeless tobacco: Never Used     Comment: quit 2010  . Alcohol Use: Yes     Comment: 2-3 large drinks, Vodka, 2-3 times a week    Family History  Problem Relation Age of Onset  . Colon cancer Neg Hx   . Prostate cancer Neg Hx   . CAD Father     ?  . Diabetes Neg Hx      Review of Systems  Constitutional: Negative.   HENT: Negative.    Eyes: Negative.   Respiratory: Negative.   Cardiovascular: Negative.   Gastrointestinal: Negative.   Genitourinary: Positive for hematuria.  Musculoskeletal: Positive for joint pain.  Skin: Negative.   Neurological: Negative.   Endo/Heme/Allergies: Negative.   Psychiatric/Behavioral: Positive for depression.    Objective:  Physical Exam  Constitutional: He is oriented to person, place, and time. He appears well-developed and well-nourished.  HENT:  Head: Normocephalic and atraumatic.  Eyes: Pupils are equal, round, and reactive to light.  Neck: Normal range of motion. Neck supple.  Cardiovascular: Intact distal pulses.   Respiratory: Effort normal.  Musculoskeletal: He exhibits tenderness.  Patient walks with a right greater than left-sided antalgic gait.  Range of motion of both knees reveals bilateral 5-10 flexion contractures and maximum flexion is around 120.  He is tender along the medial joint lines.  The photographs from surgery are reviewed with the patient.  Neurological: He is alert and oriented to person, place, and time.  Skin: Skin is warm and dry.  Psychiatric: He has a normal mood and affect. His behavior is normal. Judgment and thought content normal.    Vital signs in last 24 hours: Temp:  [98 F (36.7 C)] 98 F (36.7 C) (05/15 1027) Pulse Rate:  [65] 65 (05/15 1027) BP: (150)/(81) 150/81 mmHg (05/15 1027) SpO2:  [96 %] 96 % (05/15 1027) Weight:  [126.1 kg (278 lb)] 126.1 kg (278 lb) (05/15 1027)  Labs:   Estimated body mass index is 40.09 kg/(m^2) as calculated from the following:   Height as of 05/04/13: 5\' 10"  (1.778 m).   Weight as of 05/04/13: 126.735 kg (279 lb 6.4 oz).   Imaging Review Radiographs:  X-rays were ordered, performed, and interpreted by me today included; standing AP, Rosenberg, lateral and sunrise x-rays of both knees showed [place] grade 1 changes, loss of articular cartilage 1 mm to the medial side of both knees.  The right  knee has loss of 2-3 mm of cartilage the medial facet region of the patella.  Assessment/Plan:  End stage arthritis, right knee   The patient history, physical examination, clinical judgment of the provider and imaging studies are consistent with end stage degenerative joint disease of the right knee(s) and total knee arthroplasty is deemed medically necessary. The treatment options including medical management, injection therapy arthroscopy and arthroplasty were discussed at length. The risks and benefits of total knee arthroplasty were presented and reviewed. The risks due to aseptic loosening, infection, stiffness, patella tracking problems, thromboembolic complications and other imponderables were discussed. The patient acknowledged the explanation, agreed to proceed with the plan and consent was signed. Patient is being admitted for inpatient treatment for surgery, pain control, PT, OT, prophylactic antibiotics, VTE prophylaxis,  progressive ambulation and ADL's and discharge planning. The patient is planning to be discharged home with home health services

## 2013-11-06 DIAGNOSIS — M1990 Unspecified osteoarthritis, unspecified site: Secondary | ICD-10-CM | POA: Diagnosis present

## 2013-11-06 MED ORDER — DEXTROSE-NACL 5-0.45 % IV SOLN: INTRAVENOUS | Status: DC

## 2013-11-06 MED ORDER — DEXTROSE 5 % IV SOLN
3.0000 g | INTRAVENOUS | Status: DC
  Filled 2013-11-06: qty 3000

## 2013-11-06 MED ORDER — CHLORHEXIDINE GLUCONATE 4 % EX LIQD
60.0000 mL | Freq: Once | CUTANEOUS | Status: DC
  Filled 2013-11-06: qty 60

## 2013-11-07 ENCOUNTER — Encounter (HOSPITAL_COMMUNITY): Payer: 59 | Admitting: Anesthesiology

## 2013-11-07 ENCOUNTER — Encounter (HOSPITAL_COMMUNITY)
Admission: RE | Disposition: A | Discharge status: Skilled Nursing Facility | Payer: Self-pay | Source: Ambulatory Visit | Attending: Orthopedic Surgery

## 2013-11-07 ENCOUNTER — Inpatient Hospital Stay (HOSPITAL_COMMUNITY): Payer: 59 | Admitting: Anesthesiology

## 2013-11-07 ENCOUNTER — Encounter (HOSPITAL_COMMUNITY): Payer: Self-pay | Admitting: *Deleted

## 2013-11-07 ENCOUNTER — Telehealth: Payer: Self-pay | Admitting: Internal Medicine

## 2013-11-07 ENCOUNTER — Inpatient Hospital Stay (HOSPITAL_COMMUNITY)
Admission: RE | Admit: 2013-11-07 | Discharge: 2013-11-09 | DRG: 470 | Disposition: A | Discharge status: Skilled Nursing Facility | Payer: 59 | Source: Ambulatory Visit | Attending: Orthopedic Surgery | Admitting: Orthopedic Surgery

## 2013-11-07 DIAGNOSIS — Z87891 Personal history of nicotine dependence: Secondary | ICD-10-CM

## 2013-11-07 DIAGNOSIS — K219 Gastro-esophageal reflux disease without esophagitis: Secondary | ICD-10-CM | POA: Diagnosis present

## 2013-11-07 DIAGNOSIS — E119 Type 2 diabetes mellitus without complications: Secondary | ICD-10-CM | POA: Diagnosis present

## 2013-11-07 DIAGNOSIS — M1990 Unspecified osteoarthritis, unspecified site: Secondary | ICD-10-CM | POA: Diagnosis present

## 2013-11-07 DIAGNOSIS — E785 Hyperlipidemia, unspecified: Secondary | ICD-10-CM | POA: Diagnosis present

## 2013-11-07 DIAGNOSIS — G4733 Obstructive sleep apnea (adult) (pediatric): Secondary | ICD-10-CM | POA: Diagnosis present

## 2013-11-07 DIAGNOSIS — M1710 Unilateral primary osteoarthritis, unspecified knee: Principal | ICD-10-CM | POA: Diagnosis present

## 2013-11-07 DIAGNOSIS — M1711 Unilateral primary osteoarthritis, right knee: Secondary | ICD-10-CM

## 2013-11-07 DIAGNOSIS — N400 Enlarged prostate without lower urinary tract symptoms: Secondary | ICD-10-CM | POA: Diagnosis present

## 2013-11-07 HISTORY — PX: TOTAL KNEE ARTHROPLASTY: SHX125

## 2013-11-07 LAB — GLUCOSE, CAPILLARY
GLUCOSE-CAPILLARY: 135 mg/dL — AB (ref 70–99)
Glucose-Capillary: 110 mg/dL — ABNORMAL HIGH (ref 70–99)
Glucose-Capillary: 137 mg/dL — ABNORMAL HIGH (ref 70–99)

## 2013-11-07 SURGERY — ARTHROPLASTY, KNEE, TOTAL
Anesthesia: General | Site: Knee | Laterality: Right

## 2013-11-07 MED ORDER — OXYCODONE HCL 5 MG PO TABS
ORAL_TABLET | ORAL | Status: AC
  Filled 2013-11-07: qty 1

## 2013-11-07 MED ORDER — PHENOL 1.4 % MT LIQD: 1.0000 | OROMUCOSAL | Status: DC | PRN

## 2013-11-07 MED ORDER — ONDANSETRON HCL 4 MG/2ML IJ SOLN: 4.0000 mg | Freq: Four times a day (QID) | INTRAMUSCULAR | Status: DC | PRN

## 2013-11-07 MED ORDER — PROPOFOL 10 MG/ML IV BOLUS
INTRAVENOUS | Status: AC
  Filled 2013-11-07: qty 20

## 2013-11-07 MED ORDER — GABAPENTIN 400 MG PO CAPS
800.0000 mg | ORAL_CAPSULE | Freq: Four times a day (QID) | ORAL | Status: DC
  Administered 2013-11-07 – 2013-11-09 (×7): 800 mg via ORAL
  Filled 2013-11-07 (×12): qty 2

## 2013-11-07 MED ORDER — TRANEXAMIC ACID 100 MG/ML IV SOLN
1000.0000 mg | INTRAVENOUS | Status: AC
  Administered 2013-11-07: 1000 mg via INTRAVENOUS
  Filled 2013-11-07: qty 10

## 2013-11-07 MED ORDER — KCL IN DEXTROSE-NACL 20-5-0.45 MEQ/L-%-% IV SOLN
INTRAVENOUS | Status: DC
  Administered 2013-11-07 (×2): via INTRAVENOUS
  Filled 2013-11-07 (×9): qty 1000

## 2013-11-07 MED ORDER — SODIUM CHLORIDE 0.9 % IJ SOLN
INTRAMUSCULAR | Status: AC
  Filled 2013-11-07: qty 10

## 2013-11-07 MED ORDER — SODIUM CHLORIDE 0.9 % IR SOLN
Status: DC | PRN
  Administered 2013-11-07: 3000 mL

## 2013-11-07 MED ORDER — SODIUM CHLORIDE 0.9 % IJ SOLN
INTRAMUSCULAR | Status: DC | PRN
  Administered 2013-11-07: 40 mL via INTRAVENOUS

## 2013-11-07 MED ORDER — ALPRAZOLAM 0.5 MG PO TABS
0.5000 mg | ORAL_TABLET | Freq: Every evening | ORAL | Status: DC | PRN
  Administered 2013-11-07 (×2): 0.5 mg via ORAL
  Filled 2013-11-07 (×2): qty 1

## 2013-11-07 MED ORDER — LIDOCAINE HCL (CARDIAC) 20 MG/ML IV SOLN
INTRAVENOUS | Status: AC
  Filled 2013-11-07: qty 5

## 2013-11-07 MED ORDER — OXYCODONE-ACETAMINOPHEN 5-325 MG PO TABS: 1.0000 | ORAL_TABLET | ORAL | Status: DC | PRN

## 2013-11-07 MED ORDER — PHENYLEPHRINE HCL 10 MG/ML IJ SOLN
INTRAMUSCULAR | Status: DC | PRN
  Administered 2013-11-07: 40 ug via INTRAVENOUS

## 2013-11-07 MED ORDER — DOCUSATE SODIUM 100 MG PO CAPS
100.0000 mg | ORAL_CAPSULE | Freq: Two times a day (BID) | ORAL | Status: DC
  Administered 2013-11-07 – 2013-11-09 (×5): 100 mg via ORAL
  Filled 2013-11-07 (×7): qty 1

## 2013-11-07 MED ORDER — BISACODYL 5 MG PO TBEC: 5.0000 mg | DELAYED_RELEASE_TABLET | Freq: Every day | ORAL | Status: DC | PRN

## 2013-11-07 MED ORDER — METHOCARBAMOL 1000 MG/10ML IJ SOLN
500.0000 mg | Freq: Four times a day (QID) | INTRAMUSCULAR | Status: DC | PRN
Start: 2013-11-07 — End: 2013-11-09
  Filled 2013-11-07: qty 5

## 2013-11-07 MED ORDER — DIPHENHYDRAMINE HCL 12.5 MG/5ML PO ELIX: 12.5000 mg | ORAL_SOLUTION | ORAL | Status: DC | PRN

## 2013-11-07 MED ORDER — PHENYLEPHRINE 40 MCG/ML (10ML) SYRINGE FOR IV PUSH (FOR BLOOD PRESSURE SUPPORT)
PREFILLED_SYRINGE | INTRAVENOUS | Status: AC
  Filled 2013-11-07: qty 10

## 2013-11-07 MED ORDER — ONDANSETRON HCL 4 MG/2ML IJ SOLN
INTRAMUSCULAR | Status: DC | PRN
Start: 2013-11-07 — End: 2013-11-07
  Administered 2013-11-07: 4 mg via INTRAVENOUS

## 2013-11-07 MED ORDER — LIDOCAINE HCL (CARDIAC) 20 MG/ML IV SOLN
INTRAVENOUS | Status: DC | PRN
  Administered 2013-11-07: 100 mg via INTRAVENOUS

## 2013-11-07 MED ORDER — FENTANYL CITRATE 0.05 MG/ML IJ SOLN
INTRAMUSCULAR | Status: AC
  Filled 2013-11-07: qty 5

## 2013-11-07 MED ORDER — GABAPENTIN 800 MG PO TABS
800.0000 mg | ORAL_TABLET | Freq: Four times a day (QID) | ORAL | Status: DC
  Filled 2013-11-07 (×3): qty 1

## 2013-11-07 MED ORDER — MIDAZOLAM HCL 2 MG/2ML IJ SOLN
INTRAMUSCULAR | Status: AC
  Filled 2013-11-07: qty 2

## 2013-11-07 MED ORDER — CEFUROXIME SODIUM 1.5 G IJ SOLR
INTRAMUSCULAR | Status: DC | PRN
  Administered 2013-11-07: 1.5 g

## 2013-11-07 MED ORDER — BUPIVACAINE LIPOSOME 1.3 % IJ SUSP
INTRAMUSCULAR | Status: DC | PRN
  Administered 2013-11-07: 20 mL

## 2013-11-07 MED ORDER — SUCCINYLCHOLINE CHLORIDE 20 MG/ML IJ SOLN
INTRAMUSCULAR | Status: AC
  Filled 2013-11-07: qty 1

## 2013-11-07 MED ORDER — METOCLOPRAMIDE HCL 5 MG/ML IJ SOLN: 5.0000 mg | Freq: Three times a day (TID) | INTRAMUSCULAR | Status: DC | PRN

## 2013-11-07 MED ORDER — OXYCODONE HCL 5 MG PO TABS: 5.0000 mg | ORAL_TABLET | Freq: Once | ORAL | Status: DC | PRN

## 2013-11-07 MED ORDER — FENTANYL CITRATE 0.05 MG/ML IJ SOLN
INTRAMUSCULAR | Status: DC | PRN
  Administered 2013-11-07: 50 ug via INTRAVENOUS
  Administered 2013-11-07: 100 ug via INTRAVENOUS
  Administered 2013-11-07: 50 ug via INTRAVENOUS
  Administered 2013-11-07: 100 ug via INTRAVENOUS

## 2013-11-07 MED ORDER — ONDANSETRON HCL 4 MG/2ML IJ SOLN: 4.0000 mg | Freq: Once | INTRAMUSCULAR | Status: DC | PRN

## 2013-11-07 MED ORDER — EPHEDRINE SULFATE 50 MG/ML IJ SOLN
INTRAMUSCULAR | Status: AC
  Filled 2013-11-07: qty 1

## 2013-11-07 MED ORDER — BUPIVACAINE-EPINEPHRINE (PF) 0.25% -1:200000 IJ SOLN
INTRAMUSCULAR | Status: AC
  Filled 2013-11-07: qty 30

## 2013-11-07 MED ORDER — SENNOSIDES-DOCUSATE SODIUM 8.6-50 MG PO TABS: 1.0000 | ORAL_TABLET | Freq: Every evening | ORAL | Status: DC | PRN

## 2013-11-07 MED ORDER — BUPROPION HCL ER (XL) 300 MG PO TB24
300.0000 mg | ORAL_TABLET | Freq: Every day | ORAL | Status: DC
  Administered 2013-11-08 – 2013-11-09 (×2): 300 mg via ORAL
  Filled 2013-11-07 (×2): qty 1

## 2013-11-07 MED ORDER — METOCLOPRAMIDE HCL 5 MG PO TABS
5.0000 mg | ORAL_TABLET | Freq: Three times a day (TID) | ORAL | Status: DC | PRN
  Filled 2013-11-07: qty 2

## 2013-11-07 MED ORDER — MIDAZOLAM HCL 5 MG/5ML IJ SOLN
INTRAMUSCULAR | Status: DC | PRN
  Administered 2013-11-07: 2 mg via INTRAVENOUS

## 2013-11-07 MED ORDER — HYDROMORPHONE HCL PF 1 MG/ML IJ SOLN
1.0000 mg | INTRAMUSCULAR | Status: DC | PRN
  Administered 2013-11-07 (×4): 1 mg via INTRAVENOUS
  Filled 2013-11-07 (×4): qty 1

## 2013-11-07 MED ORDER — CEFAZOLIN SODIUM-DEXTROSE 2-3 GM-% IV SOLR
INTRAVENOUS | Status: DC | PRN
Start: 2013-11-07 — End: 2013-11-07
  Administered 2013-11-07: 2 g via INTRAVENOUS

## 2013-11-07 MED ORDER — METHOCARBAMOL 500 MG PO TABS
ORAL_TABLET | ORAL | Status: AC
  Filled 2013-11-07: qty 1

## 2013-11-07 MED ORDER — BUPIVACAINE-EPINEPHRINE (PF) 0.5% -1:200000 IJ SOLN
INTRAMUSCULAR | Status: DC | PRN
  Administered 2013-11-07: 30 mL

## 2013-11-07 MED ORDER — ROCURONIUM BROMIDE 50 MG/5ML IV SOLN
INTRAVENOUS | Status: AC
  Filled 2013-11-07: qty 1

## 2013-11-07 MED ORDER — HYDROMORPHONE HCL PF 1 MG/ML IJ SOLN
INTRAMUSCULAR | Status: AC
  Filled 2013-11-07: qty 1

## 2013-11-07 MED ORDER — FUROSEMIDE 20 MG PO TABS
20.0000 mg | ORAL_TABLET | Freq: Every day | ORAL | Status: DC
  Administered 2013-11-07: 20 mg via ORAL
  Filled 2013-11-07 (×3): qty 1

## 2013-11-07 MED ORDER — ONDANSETRON HCL 4 MG PO TABS
4.0000 mg | ORAL_TABLET | Freq: Four times a day (QID) | ORAL | Status: DC | PRN
  Administered 2013-11-08: 4 mg via ORAL
  Filled 2013-11-07: qty 1

## 2013-11-07 MED ORDER — MEPERIDINE HCL 25 MG/ML IJ SOLN: 6.2500 mg | INTRAMUSCULAR | Status: DC | PRN

## 2013-11-07 MED ORDER — ACETAMINOPHEN 650 MG RE SUPP: 650.0000 mg | Freq: Four times a day (QID) | RECTAL | Status: DC | PRN

## 2013-11-07 MED ORDER — ACETAMINOPHEN 325 MG PO TABS
650.0000 mg | ORAL_TABLET | Freq: Four times a day (QID) | ORAL | Status: DC | PRN
  Filled 2013-11-07 (×2): qty 2

## 2013-11-07 MED ORDER — MAGNESIUM CITRATE PO SOLN: 1.0000 | Freq: Once | ORAL | Status: AC | PRN

## 2013-11-07 MED ORDER — ALUM & MAG HYDROXIDE-SIMETH 200-200-20 MG/5ML PO SUSP: 30.0000 mL | ORAL | Status: DC | PRN

## 2013-11-07 MED ORDER — HYDROMORPHONE HCL PF 1 MG/ML IJ SOLN
0.2500 mg | INTRAMUSCULAR | Status: DC | PRN
  Administered 2013-11-07 (×4): 0.5 mg via INTRAVENOUS

## 2013-11-07 MED ORDER — METOPROLOL SUCCINATE ER 100 MG PO TB24
100.0000 mg | ORAL_TABLET | Freq: Every day | ORAL | Status: DC
  Administered 2013-11-08 – 2013-11-09 (×2): 100 mg via ORAL
  Filled 2013-11-07 (×2): qty 1

## 2013-11-07 MED ORDER — CEFUROXIME SODIUM 1.5 G IJ SOLR
INTRAMUSCULAR | Status: AC
  Filled 2013-11-07: qty 1.5

## 2013-11-07 MED ORDER — METHOCARBAMOL 500 MG PO TABS: 500.0000 mg | ORAL_TABLET | Freq: Two times a day (BID) | ORAL | Status: DC

## 2013-11-07 MED ORDER — OXYCODONE HCL 5 MG PO TABS
5.0000 mg | ORAL_TABLET | ORAL | Status: DC | PRN
  Administered 2013-11-07: 5 mg via ORAL
  Administered 2013-11-07 – 2013-11-08 (×5): 10 mg via ORAL
  Filled 2013-11-07 (×5): qty 2

## 2013-11-07 MED ORDER — MENTHOL 3 MG MT LOZG: 1.0000 | LOZENGE | OROMUCOSAL | Status: DC | PRN

## 2013-11-07 MED ORDER — OXYCODONE HCL 5 MG/5ML PO SOLN: 5.0000 mg | Freq: Once | ORAL | Status: DC | PRN

## 2013-11-07 MED ORDER — LACTATED RINGERS IV SOLN
INTRAVENOUS | Status: DC | PRN
  Administered 2013-11-07 (×2): via INTRAVENOUS

## 2013-11-07 MED ORDER — BUPIVACAINE LIPOSOME 1.3 % IJ SUSP
20.0000 mL | Freq: Once | INTRAMUSCULAR | Status: DC
  Filled 2013-11-07: qty 20

## 2013-11-07 MED ORDER — ASPIRIN EC 325 MG PO TBEC: 325.0000 mg | DELAYED_RELEASE_TABLET | Freq: Two times a day (BID) | ORAL | Status: DC

## 2013-11-07 MED ORDER — METHOCARBAMOL 500 MG PO TABS
500.0000 mg | ORAL_TABLET | Freq: Four times a day (QID) | ORAL | Status: DC | PRN
  Administered 2013-11-07 – 2013-11-09 (×8): 500 mg via ORAL
  Filled 2013-11-07 (×7): qty 1

## 2013-11-07 MED ORDER — ASPIRIN EC 325 MG PO TBEC
325.0000 mg | DELAYED_RELEASE_TABLET | Freq: Every day | ORAL | Status: DC
  Administered 2013-11-08 – 2013-11-09 (×2): 325 mg via ORAL
  Filled 2013-11-07 (×3): qty 1

## 2013-11-07 MED ORDER — PROPOFOL 10 MG/ML IV BOLUS
INTRAVENOUS | Status: DC | PRN
  Administered 2013-11-07: 200 mg via INTRAVENOUS

## 2013-11-07 MED ORDER — LISINOPRIL 40 MG PO TABS
40.0000 mg | ORAL_TABLET | Freq: Every day | ORAL | Status: DC
  Administered 2013-11-08 – 2013-11-09 (×2): 40 mg via ORAL
  Filled 2013-11-07 (×2): qty 1

## 2013-11-07 MED ORDER — PANTOPRAZOLE SODIUM 40 MG PO TBEC
80.0000 mg | DELAYED_RELEASE_TABLET | Freq: Every day | ORAL | Status: DC
  Administered 2013-11-08 – 2013-11-09 (×2): 80 mg via ORAL
  Filled 2013-11-07 (×2): qty 2

## 2013-11-07 SURGICAL SUPPLY — 57 items
BANDAGE ESMARK 6X9 LF (GAUZE/BANDAGES/DRESSINGS) ×1 IMPLANT
BLADE 10 SAFETY STRL DISP (BLADE) ×3 IMPLANT
BLADE SAG 18X100X1.27 (BLADE) ×3 IMPLANT
BLADE SAW SGTL 13X75X1.27 (BLADE) ×3 IMPLANT
BNDG ELASTIC 6X10 VLCR STRL LF (GAUZE/BANDAGES/DRESSINGS) ×3 IMPLANT
BNDG ESMARK 6X9 LF (GAUZE/BANDAGES/DRESSINGS) ×3
BOWL SMART MIX CTS (DISPOSABLE) ×3 IMPLANT
CAPT RP KNEE ×3 IMPLANT
CEMENT HV SMART SET (Cement) ×6 IMPLANT
COVER SURGICAL LIGHT HANDLE (MISCELLANEOUS) ×3 IMPLANT
CUFF TOURNIQUET SINGLE 34IN LL (TOURNIQUET CUFF) IMPLANT
CUFF TOURNIQUET SINGLE 44IN (TOURNIQUET CUFF) IMPLANT
DRAPE EXTREMITY T 121X128X90 (DRAPE) ×3 IMPLANT
DRAPE U-SHAPE 47X51 STRL (DRAPES) ×3 IMPLANT
DURAPREP 26ML APPLICATOR (WOUND CARE) ×6 IMPLANT
ELECT REM PT RETURN 9FT ADLT (ELECTROSURGICAL) ×3
ELECTRODE REM PT RTRN 9FT ADLT (ELECTROSURGICAL) ×1 IMPLANT
EVACUATOR 1/8 PVC DRAIN (DRAIN) ×3 IMPLANT
GAUZE XEROFORM 1X8 LF (GAUZE/BANDAGES/DRESSINGS) ×3 IMPLANT
GLOVE BIO SURGEON STRL SZ7.5 (GLOVE) ×3 IMPLANT
GLOVE BIO SURGEON STRL SZ8.5 (GLOVE) ×3 IMPLANT
GLOVE BIOGEL PI IND STRL 8 (GLOVE) ×1 IMPLANT
GLOVE BIOGEL PI IND STRL 9 (GLOVE) ×1 IMPLANT
GLOVE BIOGEL PI INDICATOR 8 (GLOVE) ×2
GLOVE BIOGEL PI INDICATOR 9 (GLOVE) ×2
GOWN STRL REUS W/ TWL LRG LVL3 (GOWN DISPOSABLE) ×1 IMPLANT
GOWN STRL REUS W/ TWL XL LVL3 (GOWN DISPOSABLE) ×2 IMPLANT
GOWN STRL REUS W/TWL LRG LVL3 (GOWN DISPOSABLE) ×2
GOWN STRL REUS W/TWL XL LVL3 (GOWN DISPOSABLE) ×4
HANDPIECE INTERPULSE COAX TIP (DISPOSABLE) ×2
HOOD PEEL AWAY FACE SHEILD DIS (HOOD) ×6 IMPLANT
KIT BASIN OR (CUSTOM PROCEDURE TRAY) ×3 IMPLANT
KIT ROOM TURNOVER OR (KITS) ×3 IMPLANT
MANIFOLD NEPTUNE II (INSTRUMENTS) ×3 IMPLANT
NDL SAFETY ECLIPSE 18X1.5 (NEEDLE) IMPLANT
NEEDLE 22X1 1/2 (OR ONLY) (NEEDLE) ×3 IMPLANT
NEEDLE HYPO 18GX1.5 SHARP (NEEDLE)
NEEDLE SPNL 18GX3.5 QUINCKE PK (NEEDLE) IMPLANT
NS IRRIG 1000ML POUR BTL (IV SOLUTION) ×3 IMPLANT
PACK TOTAL JOINT (CUSTOM PROCEDURE TRAY) ×3 IMPLANT
PAD ARMBOARD 7.5X6 YLW CONV (MISCELLANEOUS) ×6 IMPLANT
PADDING CAST COTTON 6X4 STRL (CAST SUPPLIES) ×3 IMPLANT
SET HNDPC FAN SPRY TIP SCT (DISPOSABLE) ×1 IMPLANT
SPONGE GAUZE 4X4 12PLY (GAUZE/BANDAGES/DRESSINGS) ×3 IMPLANT
STAPLER VISISTAT 35W (STAPLE) ×3 IMPLANT
SUCTION FRAZIER TIP 10 FR DISP (SUCTIONS) ×3 IMPLANT
SUT VIC AB 0 CTX 36 (SUTURE) ×2
SUT VIC AB 0 CTX36XBRD ANTBCTR (SUTURE) ×1 IMPLANT
SUT VIC AB 1 CTX 36 (SUTURE) ×2
SUT VIC AB 1 CTX36XBRD ANBCTR (SUTURE) ×1 IMPLANT
SUT VIC AB 2-0 CT1 27 (SUTURE) ×2
SUT VIC AB 2-0 CT1 TAPERPNT 27 (SUTURE) ×1 IMPLANT
SYR 30ML LL (SYRINGE) ×3 IMPLANT
SYR 50ML LL SCALE MARK (SYRINGE) ×3 IMPLANT
TOWEL OR 17X24 6PK STRL BLUE (TOWEL DISPOSABLE) ×3 IMPLANT
TOWEL OR 17X26 10 PK STRL BLUE (TOWEL DISPOSABLE) ×3 IMPLANT
WATER STERILE IRR 1000ML POUR (IV SOLUTION) ×6 IMPLANT

## 2013-11-07 NOTE — Plan of Care (Signed)
Problem: Consults Goal: Diagnosis- Total Joint Replacement Primary Total Knee Right     

## 2013-11-07 NOTE — Transfer of Care (Signed)
Immediate Anesthesia Transfer of Care Note  Patient: Robert Gibbs  Procedure(s) Performed: Procedure(s): TOTAL KNEE ARTHROPLASTY (Right)  Patient Location: PACU  Anesthesia Type:General  Level of Consciousness: awake, alert  and oriented  Airway & Oxygen Therapy: Patient Spontanous Breathing and Patient connected to nasal cannula oxygen  Post-op Assessment: Report given to PACU RN and Post -op Vital signs reviewed and stable  Post vital signs: Reviewed and stable  Complications: No apparent anesthesia complications

## 2013-11-07 NOTE — Anesthesia Preprocedure Evaluation (Signed)
Anesthesia Evaluation  Patient identified by MRN, date of birth, ID band Patient awake    Reviewed: Allergy & Precautions, H&P , NPO status , Patient's Chart, lab work & pertinent test results  Airway Mallampati: I TM Distance: >3 FB Neck ROM: Full    Dental   Pulmonary sleep apnea , former smoker,          Cardiovascular hypertension, Pt. on medications     Neuro/Psych    GI/Hepatic   Endo/Other  diabetes  Renal/GU      Musculoskeletal   Abdominal   Peds  Hematology   Anesthesia Other Findings   Reproductive/Obstetrics                           Anesthesia Physical Anesthesia Plan  ASA: III  Anesthesia Plan: General   Post-op Pain Management:    Induction: Intravenous  Airway Management Planned: LMA  Additional Equipment:   Intra-op Plan:   Post-operative Plan: Extubation in OR  Informed Consent: I have reviewed the patients History and Physical, chart, labs and discussed the procedure including the risks, benefits and alternatives for the proposed anesthesia with the patient or authorized representative who has indicated his/her understanding and acceptance.     Plan Discussed with: CRNA and Surgeon  Anesthesia Plan Comments:         Anesthesia Quick Evaluation

## 2013-11-07 NOTE — Telephone Encounter (Signed)
Relevant patient education mailed to patient.  

## 2013-11-07 NOTE — Anesthesia Procedure Notes (Addendum)
Anesthesia Regional Block:  Interscalene brachial plexus block  Pre-Anesthetic Checklist: ,, timeout performed, Correct Patient, Correct Site, Correct Laterality, Correct Procedure, Correct Position, site marked, Risks and benefits discussed,  Surgical consent,  Pre-op evaluation,  At surgeon's request and post-op pain management  Laterality: Right  Prep: chloraprep       Needles:  Injection technique: Single-shot  Needle Type: Echogenic Stimulator Needle     Needle Length: 9cm 9 cm Needle Gauge: 21 and 21 G    Additional Needles:  Procedures: ultrasound guided (picture in chart) and nerve stimulator Interscalene brachial plexus block  Nerve Stimulator or Paresthesia:  Response: 0.4 mA,   Additional Responses:   Narrative:  Start time: 11/07/2013 7:05 AM End time: 11/07/2013 7:20 AM Injection made incrementally with aspirations every 5 mL. Anesthesiologist: Lillia Abed MD  Additional Notes: Monitors applied. Patient sedated. Sterile prep and drape,hand hygiene and sterile gloves were used. Relevant anatomy identified.Needle position confirmed.Local anesthetic injected incrementally after negative aspiration. Local anesthetic spread visualized around nerve(s). Vascular puncture avoided. No complications. Image printed for medical record.The patient tolerated the procedure well.        Procedure Name: LMA Insertion Date/Time: 11/07/2013 7:35 AM Performed by: Mariea Clonts Pre-anesthesia Checklist: Patient being monitored, Patient identified, Timeout performed, Emergency Drugs available and Suction available Patient Re-evaluated:Patient Re-evaluated prior to inductionOxygen Delivery Method: Circle system utilized Preoxygenation: Pre-oxygenation with 100% oxygen Intubation Type: IV induction Ventilation: Mask ventilation without difficulty LMA: LMA inserted LMA Size: 5.0 Placement Confirmation: positive ETCO2 and breath sounds checked- equal and bilateral Tube  secured with: Tape Dental Injury: Teeth and Oropharynx as per pre-operative assessment

## 2013-11-07 NOTE — Progress Notes (Signed)
Orthopedic Tech Progress Note Patient Details:  Robert Gibbs 1951-02-04 [id number] CPM applied to RLE with appropriate settings. OHF applied to bed.  CPM Right Knee CPM Right Knee: On Right Knee Flexion (Degrees): 60 Right Knee Extension (Degrees): 0   Asia R Thompson 11/07/2013, 10:23 AM

## 2013-11-07 NOTE — Anesthesia Postprocedure Evaluation (Signed)
Anesthesia Post Note  Patient: Robert Gibbs  Procedure(s) Performed: Procedure(s) (LRB): TOTAL KNEE ARTHROPLASTY (Right)  Anesthesia type: general  Patient location: PACU  Post pain: Pain level controlled  Post assessment: Patient's Cardiovascular Status Stable  Last Vitals:  Filed Vitals:   11/07/13 1222  BP: 138/77  Pulse: 80  Temp: 36.4 C  Resp: 18    Post vital signs: Reviewed and stable  Level of consciousness: sedated  Complications: No apparent anesthesia complications

## 2013-11-07 NOTE — Op Note (Signed)
PATIENT ID:      Robert Gibbs  MRN:     [id number] DOB/AGE:    1950-12-08 / 63 y.o.       OPERATIVE REPORT    DATE OF PROCEDURE:  11/07/2013       PREOPERATIVE DIAGNOSIS:   OSTEOARTHRITIS RIGHT KNEE      Estimated body mass index is 40.09 kg/(m^2) as calculated from the following:   Height as of 11/04/13: 5\' 10"  (1.778 m).   Weight as of 05/04/13: 126.735 kg (279 lb 6.4 oz).                                                        POSTOPERATIVE DIAGNOSIS:   osteoarthritis right knee                                                                       PROCEDURE:  Procedure(s): TOTAL KNEE ARTHROPLASTY Using Depuy Sigma RP implants #4R Femur, #4Tibia, 36mm Sigma RP bearing, 38 Patella     SURGEON: Kerin Salen    ASSISTANT:   Kerry Hough. [employer]   (Present and scrubbed throughout the case, critical for assistance with exposure, retraction, instrumentation, and closure.)         ANESTHESIA: GET, ACB, Exparel  DRAINS: 2 medium hemovac in knee   TOURNIQUET TIME: 29BMW   COMPLICATIONS:  None     SPECIMENS: None   INDICATIONS FOR PROCEDURE: The patient has  OSTEOARTHRITIS RIGHT KNEE, varus deformities, XR shows bone on bone PFJ arthritis. Patient has failed all conservative measures including anti-inflammatory medicines, narcotics, attempts at  exercise and weight loss, cortisone injections and viscosupplementation.  Risks and benefits of surgery have been discussed, questions answered.   DESCRIPTION OF PROCEDURE: The patient identified by armband, received  IV antibiotics, in the holding area at [facility]. Patient taken to the operating room, appropriate anesthetic  monitors were attached, and general endotracheal anesthesia induced with  the patient in supine position, Foley catheter was inserted. Tourniquet  applied high to the operative thigh. Lateral post and foot positioner  applied to the table, the lower extremity was then prepped and draped  in usual sterile fashion  from the ankle to the tourniquet. Time-out procedure was performed. The limb was wrapped with an Esmarch bandage and the tourniquet inflated to 350 mmHg. We began the operation by making the anterior midline incision starting at handbreadth above the patella going over the patella 1 cm medial to and  4 cm distal to the tibial tubercle. Small bleeders in the skin and the  subcutaneous tissue identified and cauterized. Transverse retinaculum was incised and reflected medially and a medial parapatellar arthrotomy was accomplished. the patella was everted and theprepatellar fat pad resected. The superficial medial collateral  ligament was then elevated from anterior to posterior along the proximal  flare of the tibia and anterior half of the menisci resected. The knee was hyperflexed exposing bone on bone arthritis. Peripheral and notch osteophytes as well as the cruciate ligaments were then resected. We continued to  work our  way around posteriorly along the proximal tibia, and externally  rotated the tibia subluxing it out from underneath the femur. A McHale  retractor was placed through the notch and a lateral Hohmann retractor  placed, and we then drilled through the proximal tibia in line with the  axis of the tibia followed by an intramedullary guide rod and 2-degree  posterior slope cutting guide. The tibial cutting guide was pinned into place  allowing resection of 7 mm of bone medially and about 7 mm of bone  laterally because of her varus deformity. Satisfied with the tibial resection, we then  entered the distal femur 2 mm anterior to the PCL origin with the  intramedullary guide rod and applied the distal femoral cutting guide  set at 40mm, with 5 degrees of valgus. This was pinned along the  epicondylar axis. At this point, the distal femoral cut was accomplished without difficulty. We then sized for a #4R femoral component and pinned the guide in 0 degrees of external rotation.The chamfer  cutting guide was pinned into place. The anterior, posterior, and chamfer cuts were accomplished without difficulty followed by  the box cutting guide and the box cut. We also removed posterior osteophytes from the posterior femoral condyles. At this  time, the knee was brought into full extension. We checked our  extension and flexion gaps and found them symmetric at 49mm.  The patella thickness measured at 24 mm. We set the cutting guide at 15 and removed the posterior 9 mm  of the patella, sized for a 38 button and drilled the lollipop. The knee  was then once again hyperflexed exposing the proximal tibia. We sized for a #4 tibial base plate, applied the smokestack and the conical reamer followed by the the Delta fin keel punch. We then hammered into place the Sigma RP trial femoral component, inserted a 10-mm trial bearing, trial patellar button, and took the knee through range of motion from 0-130 degrees. No thumb pressure was required for patellar  tracking. At this point, all trial components were removed, a double batch of DePuy HV cement with 1500 mg of Zinacef was mixed and applied to all bony metallic mating surfaces except for the posterior condyles of the femur itself. In order, we  hammered into place the tibial tray and removed excess cement, the femoral component and removed excess cement, a 10-mm Sigma RP bearing  was inserted, and the knee brought to full extension with compression.  The patellar button was clamped into place, and excess cement  removed. While the cement cured the wound was irrigated out with normal saline solution pulse lavage, and medium Hemovac drains were placed from an anterolateral  approach. Ligament stability and patellar tracking were checked and found to be excellent. The parapatellar arthrotomy was closed with  running #1 Vicryl suture. The subcutaneous tissue with 0 and 2-0 undyed  Vicryl suture, and the skin with skin staples. A dressing of Xeroform,  4  x 4, dressing sponges, Webril, and Ace wrap applied. The patient  awakened, extubated, and taken to recovery room without difficulty.   Kerin Salen 11/07/2013, 9:02 AM

## 2013-11-07 NOTE — Evaluation (Signed)
Physical Therapy Evaluation Patient Details Name: Robert Gibbs MRN: [id number] DOB: 1950-10-23 Today's Date: 11/07/2013   History of Present Illness  Admitted with OA, s/p R TKA  Clinical Impression   Pt admitted with/for RTKA.  Pt currently limited functionally due to the problems listed. ( See problems list.)   Pt will benefit from PT to maximize function and safety in order to get ready for next venue listed below.      Follow Up Recommendations CIR    Equipment Recommendations  Rolling walker with 5" wheels;3in1 (PT)    Recommendations for Other Services Rehab consult     Precautions / Restrictions Precautions Precautions: Knee Precaution Booklet Issued: Yes (comment)      Mobility  Bed Mobility Overal bed mobility: Needs Assistance Bed Mobility: Supine to Sit     Supine to sit: Mod assist;HOB elevated     General bed mobility comments: cues for technique and truncal assist to sit up.  Transfers Overall transfer level: Needs assistance Equipment used: Rolling walker (2 wheeled) Transfers: Sit to/from Stand Sit to Stand: Min guard;From elevated surface         General transfer comment: cues for technique; guard for safety due to weak R LE  Ambulation/Gait                Stairs            Wheelchair Mobility    Modified Rankin (Stroke Patients Only)       Balance Overall balance assessment: Needs assistance Sitting-balance support: Feet supported;No upper extremity supported Sitting balance-Leahy Scale: Fair     Standing balance support: Bilateral upper extremity supported Standing balance-Leahy Scale: Poor                               Pertinent Vitals/Pain Up to 10/10 knee pain    Home Living Family/patient expects to be discharged to:: Private residence Living Arrangements: Spouse/significant other Available Help at Discharge: Available PRN/intermittently;Other (Comment) (needs to be independent) Type of  Home: House Home Access: Stairs to enter Entrance Stairs-Rails: Chemical engineer of Steps: 4 Home Layout: Two level;Bed/bath upstairs;1/2 bath on main level;Able to live on main level with bedroom/bathroom;Other (Comment) (sleeps in a recliner) Home Equipment: Crutches      Prior Function Level of Independence: Independent               Hand Dominance        Extremity/Trunk Assessment               Lower Extremity Assessment: Generalized weakness;RLE deficits/detail RLE Deficits / Details: knee flexion ~80*  assisted,  generally weak, R quads unable to fully support weight.       Communication   Communication: No difficulties  Cognition Arousal/Alertness: Awake/alert Behavior During Therapy: WFL for tasks assessed/performed Overall Cognitive Status: Within Functional Limits for tasks assessed                      General Comments      Exercises Total Joint Exercises Ankle Circles/Pumps: AROM;Both;10 reps;Supine Quad Sets: AAROM;Both;AROM;10 reps;Supine Gluteal Sets: AAROM;Right;10 reps;Supine      Assessment/Plan    PT Assessment Patient needs continued PT services  PT Diagnosis Difficulty walking;Generalized weakness;Acute pain   PT Problem List Decreased strength;Decreased range of motion;Decreased balance;Decreased knowledge of use of DME;Decreased safety awareness;Decreased knowledge of precautions;Pain  PT Treatment Interventions DME instruction;Gait training;Stair training;Functional mobility training;Therapeutic  exercise;Patient/family education;Therapeutic activities   PT Goals (Current goals can be found in the Care Plan section) Acute Rehab PT Goals Patient Stated Goal: be independent and my knee not limit what I want to do. PT Goal Formulation: With patient Time For Goal Achievement: 11/14/13 Potential to Achieve Goals: Good    Frequency 7X/week   Barriers to discharge Decreased caregiver support;Inaccessible  home environment      Co-evaluation               End of Session   Activity Tolerance: Patient tolerated treatment well Patient left: in chair;with call bell/phone within reach Nurse Communication: Mobility status         Time: 4235-3614 PT Time Calculation (min): 31 min   Charges:   PT Evaluation $Initial PT Evaluation Tier I: 1 Procedure PT Treatments $Therapeutic Exercise: 8-22 mins $Therapeutic Activity: 8-22 mins   PT G CodesTessie Fass Reannon Candella 11/07/2013, 5:10 PM  11/07/2013  Donnella Sham, PT [phone]  (pager)

## 2013-11-07 NOTE — Progress Notes (Signed)
Orthopedic Tech Progress Note Patient Details:  Robert Gibbs February 19, 1951 [id number]  Patient ID: [name], male   DOB: Mar 14, 1951, 63 y.o.   MRN: [id number] Placed pt's rle in cpm @0 -60 degrees @ 2005  [name] 11/07/2013, 8:05 PM

## 2013-11-07 NOTE — Progress Notes (Signed)
Placed patient on auto BiPAP machine with hospital tubing and patients nasal prongs. Patient tolerating well at this time.

## 2013-11-07 NOTE — Interval H&P Note (Signed)
History and Physical Interval Note:  11/07/2013 7:03 AM  Robert Gibbs  has presented today for surgery, with the diagnosis of OSTEOARTHRITIS RIGHT KNEE  The various methods of treatment have been discussed with the patient and family. After consideration of risks, benefits and other options for treatment, the patient has consented to  Procedure(s): TOTAL KNEE ARTHROPLASTY (Right) as a surgical intervention .  The patient's history has been reviewed, patient examined, no change in status, stable for surgery.  I have reviewed the patient's chart and labs.  Questions were answered to the patient's satisfaction.     Kerin Salen

## 2013-11-07 NOTE — Progress Notes (Signed)
Utilization review completed.  

## 2013-11-08 ENCOUNTER — Encounter (HOSPITAL_COMMUNITY): Payer: Self-pay | Admitting: Orthopedic Surgery

## 2013-11-08 LAB — CBC
HCT: 36.2 % — ABNORMAL LOW (ref 39.0–52.0)
HEMOGLOBIN: 12.5 g/dL — AB (ref 13.0–17.0)
MCH: 34.8 pg — ABNORMAL HIGH (ref 26.0–34.0)
MCHC: 34.5 g/dL (ref 30.0–36.0)
MCV: 100.8 fL — ABNORMAL HIGH (ref 78.0–100.0)
PLATELETS: 238 10*3/uL (ref 150–400)
RBC: 3.59 MIL/uL — AB (ref 4.22–5.81)
RDW: 12.8 % (ref 11.5–15.5)
WBC: 10 10*3/uL (ref 4.0–10.5)

## 2013-11-08 LAB — GLUCOSE, CAPILLARY: Glucose-Capillary: 108 mg/dL — ABNORMAL HIGH (ref 70–99)

## 2013-11-08 MED ORDER — HYDROMORPHONE HCL 2 MG PO TABS
2.0000 mg | ORAL_TABLET | ORAL | Status: DC | PRN
  Administered 2013-11-08 – 2013-11-09 (×7): 4 mg via ORAL
  Filled 2013-11-08 (×7): qty 2

## 2013-11-08 NOTE — Progress Notes (Signed)
Agree with PTA.    Cleston Lautner, PT [phone]  

## 2013-11-08 NOTE — Progress Notes (Addendum)
Physical Therapy Treatment Patient Details Name: Robert Gibbs MRN: [id number] DOB: Nov 11, 1950 Today's Date: 11/08/2013    History of Present Illness Admitted with OA, s/p R TKA    PT Comments    Patient continues with increased pain this session. Limited most aspects of mobility. Patient is set up to go to [facility] at [place]. Updated plan. WIll continue to follow  Follow Up Recommendations  SNF     Equipment Recommendations  Rolling walker with 5" wheels;3in1 (PT)    Recommendations for Other Services       Precautions / Restrictions Precautions Precautions: Knee Restrictions RLE Weight Bearing: Weight bearing as tolerated    Mobility  Bed Mobility Overal bed mobility: Needs Assistance Bed Mobility: Supine to Sit     Supine to sit: Min assist     General bed mobility comments: Cues to technique. A for R LE.   Transfers Overall transfer level: Needs assistance Equipment used: Rolling walker (2 wheeled)   Sit to Stand: Min assist         General transfer comment: Min A this session to power up. Cues for safest hand placement.   Ambulation/Gait Ambulation/Gait assistance: Min assist Ambulation Distance (Feet): 8 Feet Assistive device: Rolling walker (2 wheeled) Gait Pattern/deviations: Step-to pattern;Antalgic Gait velocity: decreased   General Gait Details: Patient putting very little weight through R LE. Cues to get heel to strike with step. A for balance and RW positioning   Stairs            Wheelchair Mobility    Modified Rankin (Stroke Patients Only)       Balance                                    Cognition Arousal/Alertness: Awake/alert Behavior During Therapy: WFL for tasks assessed/performed Overall Cognitive Status: Within Functional Limits for tasks assessed                      Exercises Total Joint Exercises Quad Sets: AAROM;Both;AROM;10 reps;Supine Heel Slides: AAROM;Right;5 reps Hip  ABduction/ADduction: AAROM;Right;5 reps Straight Leg Raises: AAROM;Right;5 reps    General Comments        Pertinent Vitals/Pain 10/10 R knee pain with movement. Patient was premedicated    Home Living                      Prior Function            PT Goals (current goals can now be found in the care plan section) Progress towards PT goals: Progressing toward goals    Frequency  7X/week    PT Plan Discharge plan needs to be updated    Co-evaluation             End of Session Equipment Utilized During Treatment: Gait belt Activity Tolerance: Patient limited by pain Patient left: in chair;with call bell/phone within reach     Time: 1026-1053 PT Time Calculation (min): 27 min  Charges:  $Gait Training: 8-22 mins $Therapeutic Exercise: 8-22 mins                    G Codes:      Tonia Brooms Robinette 11/08/2013, 11:00 AM 11/08/2013 [place] PTA [phone] pager [phone] office

## 2013-11-08 NOTE — Progress Notes (Addendum)
Patient ID: ANEUDY CHAMPLAIN, male   DOB: [birth year], 63 y.o.   MRN: [id number] PATIENT ID: LAVAR ROSENZWEIG  MRN: [id number]  DOB/AGE:  1951-03-27 / 63 y.o.  1 Day Post-Op Procedure(s) (LRB): TOTAL KNEE ARTHROPLASTY (Right)    PROGRESS NOTE Subjective: Patient is alert, oriented, no Nausea, vomiting x1 yesterday, yes passing gas, no Bowel Movement. Taking PO sips. Denies SOB, Chest or Calf Pain. Using Incentive Spirometer, PAS in place. Ambulate WBAT today, CPM 0-60 Patient reports pain as 4 on 0-10 scale  .    Objective: Vital signs in last 24 hours: Filed Vitals:   11/08/13 0023 11/08/13 0400 11/08/13 0425 11/08/13 0620  BP: 136/46  146/65   Pulse: 103  111   Temp: 98.4 F (36.9 C)  100.1 F (37.8 C) 99.8 F (37.7 C)  TempSrc: Oral  Oral Oral  Resp: 18 18 18    SpO2: 97% 93% 94%       Intake/Output from previous day: I/O last 3 completed shifts: In: 1680 [P.O.:480; I.V.:1200] Out: 600 [Drains:600]   Intake/Output this shift: Total I/O In: 1375 [I.V.:1375] Out: 1575 [Urine:1250; Drains:325]   LABORATORY DATA:  Recent Labs  11/07/13 0932 11/07/13 1238 11/08/13 0626  GLUCAP 137* 135* 108*    Examination: Neurologically intact ABD soft Neurovascular intact Sensation intact distally Intact pulses distally Dorsiflexion/Plantar flexion intact Incision: no drainage No cellulitis present Compartment soft} Blood and plasma separated in drain indicating minimal recent drainage, drain pulled without difficulty.  Assessment:   1 Day Post-Op Procedure(s) (LRB): TOTAL KNEE ARTHROPLASTY (Right) ADDITIONAL DIAGNOSIS:  Diabetes and Sleep Apnea  Plan: PT/OT WBAT, CPM 5/hrs day until ROM 0-90 degrees, then D/C CPM DVT Prophylaxis:  SCDx72hrs, ASA 325 mg BID x 2 weeks DISCHARGE PLAN: Skilled Nursing Facility/Rehab, plans for [facility] DISCHARGE NEEDS: HHPT, [place], CPM, Walker and 3-in-1 comode seat     Kerin Salen 11/08/2013, 7:00 AM

## 2013-11-08 NOTE — Clinical Social Work Psychosocial (Signed)
Clinical Social Work Department  BRIEF PSYCHOSOCIAL ASSESSMENT  Patient: Robert Gibbs Account Number: [id number]   Admit date: 11/07/13 Clinical Social Worker Rhea Pink, MSW Date/Time: 11/08/2013 1:30 PM Referred by: Physician Date Referred: 11/07/13 Referred for   SNF Placement   Other Referral:  Interview type: Patient  Other interview type: PSYCHOSOCIAL DATA  Living Status:Alone Admitted from facility:  Level of care:  Primary support name: Eshelman,Linda Primary support relationship to patient: Friend Degree of support available:  Fair  CURRENT CONCERNS  Current Concerns   Post-Acute Placement   Other Concerns:  SOCIAL WORK ASSESSMENT / PLAN  CSW met with pt at bedside to offer support and discuss SNF placement. Patient reported that he pre-registered with [facility] and spoke with the liaison this am. Patient ws definitive in his plans and thanked CSW.  re: PT recommendation for SNF.   Pt lives alone  CSW explained placement process and answered questions.   Pt reports [facility]  as his preference    CSW completed FL2 and initiated SNF search.     Assessment/plan status: Information/Referral to [employer]  Other assessment/ plan:  Information/referral to community resources:  SNF   PTAR  PATIENT'S/FAMILY'S RESPONSE TO PLAN OF CARE:  Pt  reports he is agreeable to ST SNF in order to increase strength and independence with mobility prior to returning home  Pt verbalized understanding of placement process and appreciation for CSW assist.   Rhea Pink, MSW, [place]

## 2013-11-08 NOTE — Progress Notes (Signed)
Rehab Admissions Coordinator Note:  Patient was screened by Retta Diones for appropriateness for an Inpatient Acute Rehab Consult.  At this time, we are recommending HH.  If patient does not progress well, could consider SNF.  Patient has [facility] insurance and they will not authorize an acute inpatient rehab admission given current diagnosis.  Call me for questions.  Evalee Mutton Christyl Osentoski 11/08/2013, 8:58 AM  I can be reached at [phone].

## 2013-11-08 NOTE — Clinical Social Work Psychosocial (Addendum)
Clinical Social Work Department  CLINICAL SOCIAL WORK PLACEMENT NOTE   Patient: Robert Gibbs  Account Number: [id number]  Admit date: 11/07/13 Clinical Social Worker: Rhea Pink LCSWA Date/time: 11/08/2013 1:30 PM  Clinical Social Work is seeking post-discharge placement for this patient at the following level of care: SKILLED NURSING (*CSW will update this form in Epic as items are completed)  5/19/2015Patient/family provided with [facility] Department of Clinical Social Work's list of facilities offering this level of care within the geographic area requested by the patient (or if unable, by the patient's family).  11/08/2013 Patient/family informed of their freedom to choose among providers that offer the needed level of care, that participate in Medicare, Medicaid or managed care program needed by the patient, have an available bed and are willing to accept the patient.  5/19/2015Patient/family informed of [facility]' ownership interest in [facility], as well as of the fact that they are under no obligation to receive care at this facility.  PASARR submitted to EDS on  11/09/2013 PASARR number received from EDS on 11/09/2013 FL2 transmitted to all facilities in geographic area requested by pt/family on 11/08/2013 FL2 transmitted to all facilities within larger geographic area on  Patient informed that his/her managed care company has contracts with or will negotiate with certain facilities, including the following:  Patient/family informed of bed offers received: 11/08/2013  Patient chooses bed at [facility] Physician recommends and patient chooses bed at  Patient to be transferred to on 11/09/2013 Patient to be transferred to facility by [facility] The following physician request were entered in Epic:  Additional Comments:

## 2013-11-08 NOTE — Progress Notes (Signed)
Orthopedic Tech Progress Note Patient Details:  MALE [name] 16-Sep-1950 [id number]  Patient ID: Robert Gibbs, male   DOB: 1951/02/16, 63 y.o.   MRN: [id number] Placed pt's rle in cpm @ 0-60 degrees @1440   Breyanna Valera 11/08/2013, 2:33 PM

## 2013-11-09 LAB — CBC
HEMATOCRIT: 35.3 % — AB (ref 39.0–52.0)
HEMOGLOBIN: 11.8 g/dL — AB (ref 13.0–17.0)
MCH: 34 pg (ref 26.0–34.0)
MCHC: 33.4 g/dL (ref 30.0–36.0)
MCV: 101.7 fL — ABNORMAL HIGH (ref 78.0–100.0)
Platelets: 191 10*3/uL (ref 150–400)
RBC: 3.47 MIL/uL — ABNORMAL LOW (ref 4.22–5.81)
RDW: 12.8 % (ref 11.5–15.5)
WBC: 9.7 10*3/uL (ref 4.0–10.5)

## 2013-11-09 LAB — GLUCOSE, CAPILLARY: Glucose-Capillary: 136 mg/dL — ABNORMAL HIGH (ref 70–99)

## 2013-11-09 NOTE — Progress Notes (Signed)
PATIENT ID: Robert Gibbs  MRN: [id number]  DOB/AGE:  1951/01/27 / 63 y.o.  2 Days Post-Op Procedure(s) (LRB): TOTAL KNEE ARTHROPLASTY (Right)    PROGRESS NOTE Subjective: Patient is alert, oriented, no Nausea, no Vomiting, yes passing gas, no Bowel Movement. Taking PO well, pt up eating breakfast. Denies SOB, Chest or Calf Pain. Using Incentive Spirometer, PAS in place. Ambulate WBAT, CPM 0-60 Patient reports pain as 8 on 0-10 scale  .    Objective: Vital signs in last 24 hours: Filed Vitals:   11/08/13 1600 11/08/13 2013 11/09/13 0011 11/09/13 0552  BP:  148/69  147/73  Pulse:  90 92 87  Temp:  100.1 F (37.8 C)  99.4 F (37.4 C)  TempSrc:  Oral    Resp: 18 20 16 18   SpO2:  96% 96% 96%      Intake/Output from previous day: I/O last 3 completed shifts: In: 2095 [P.O.:720; I.V.:1375] Out: 3700 [Urine:3375; Drains:325]   Intake/Output this shift:     LABORATORY DATA:  Recent Labs  11/07/13 1238 11/08/13 0626 11/08/13 1000 11/09/13 0640 11/09/13 0642  WBC  --   --  10.0 9.7  --   HGB  --   --  12.5* 11.8*  --   HCT  --   --  36.2* 35.3*  --   PLT  --   --  238 191  --   GLUCAP 135* 108*  --   --  136*    Examination: Neurologically intact Neurovascular intact Sensation intact distally Intact pulses distally Dorsiflexion/Plantar flexion intact Incision: dressing C/D/I No cellulitis present Compartment soft}  Assessment:   2 Days Post-Op Procedure(s) (LRB): TOTAL KNEE ARTHROPLASTY (Right) ADDITIONAL DIAGNOSIS:  Diabetes and Sleep Apnea  Plan: PT/OT WBAT, CPM 5/hrs day until ROM 0-90 degrees, then D/C CPM DVT Prophylaxis:  SCDx72hrs, ASA 325 mg BID x 2 weeks DISCHARGE PLAN: Skilled Nursing Facility/Rehab [facility] when bed available DISCHARGE NEEDS: HHPT, HHRN, CPM, Walker and 3-in-1 comode seat     Leighton Parody 11/09/2013, 8:03 AM

## 2013-11-09 NOTE — Evaluation (Signed)
Occupational Therapy Evaluation Patient Details Name: Robert Gibbs MRN: [id number] DOB: 1950-08-11 Today's Date: 11/09/2013    History of Present Illness Admitted with OA, s/p R TKA   Clinical Impression   Pt admitted with the above diagnoses and presents with below problem list. Pt will benefit from continued acute OT to address the below listed deficits and maximize independence with basic ADLs prior to d/c. Pt min A for sit>stand; max A for LB ADLs perform in sit>stand due to decrease dynamic standing balance. Pt min guard for in-room ambulation, moves with extra time needed and with great effort. Recommending SNF due to decrease caregiver support.    Follow Up Recommendations  SNF    Equipment Recommendations  Other (comment) (defer until d/c plan established)    Recommendations for Other Services       Precautions / Restrictions Precautions Precautions: Knee Restrictions Weight Bearing Restrictions: Yes RLE Weight Bearing: Weight bearing as tolerated      Mobility Bed Mobility               General bed mobility comments: not assessed - pt in chair upon arrival. Pt reports difficulty with EOB<>supine.  Transfers Overall transfer level: Needs assistance Equipment used: Rolling walker (2 wheeled) Transfers: Sit to/from Stand Sit to Stand: Min assist         General transfer comment: min A for initial sit>stand improve with second sit>stand after in-room ambulation    Balance Overall balance assessment: Needs assistance Sitting-balance support: Feet supported;No upper extremity supported Sitting balance-Leahy Scale: Fair     Standing balance support: Bilateral upper extremity supported;During functional activity Standing balance-Leahy Scale: Poor                              ADL Overall ADL's : Needs assistance/impaired Eating/Feeding: Set up;Sitting   Grooming: Set up;Sitting   Upper Body Bathing: Min guard;Sitting   Lower Body  Bathing: Maximal assistance;Sit to/from stand   Upper Body Dressing : Set up;Sitting   Lower Body Dressing: Moderate assistance;Sit to/from stand   Toilet Transfer: Min guard;Cueing for sequencing;Ambulation;RW (3n1 over toilet) Toilet Transfer Details (indicate cue type and reason): extra time and cues for sequencing with rw Toileting- Clothing Manipulation and Hygiene: Maximal assistance;Sit to/from stand   Tub/ Shower Transfer: 3 in 1;Ambulation;Min guard;Rolling walker   Functional mobility during ADLs: Min guard;Rolling walker General ADL Comments: Pt needing extra time and moves with great effort. Max A for LB ADLs due to decreased dynamic standing balance.      Vision                     Perception     Praxis      Pertinent Vitals/Pain 6/10 at start of session. Increased activity during session and repositioned.     Hand Dominance     Extremity/Trunk Assessment Upper Extremity Assessment Upper Extremity Assessment: Generalized weakness   Lower Extremity Assessment Lower Extremity Assessment: Defer to PT evaluation       Communication Communication Communication: No difficulties   Cognition Arousal/Alertness: Awake/alert Behavior During Therapy: WFL for tasks assessed/performed Overall Cognitive Status: Within Functional Limits for tasks assessed                     General Comments       Exercises       Shoulder Instructions      Home Living Family/patient expects to be  discharged to:: Skilled nursing facility Living Arrangements: Spouse/significant other Available Help at Discharge: Available PRN/intermittently Type of Home: House Home Access: Stairs to enter [employer] of Steps: 4 Entrance Stairs-Rails: Left;Right Home Layout: Two level;Bed/bath upstairs;1/2 bath on main level;Able to live on main level with bedroom/bathroom;Other (Comment)   Alternate Level Stairs-Rails: Right;Left Bathroom Shower/Tub: Other  (comment) (shower not on main level; plans to sponge bathe)   Bathroom Toilet: Standard     Home Equipment: Crutches          Prior Functioning/Environment Level of Independence: Independent             OT Diagnosis: Generalized weakness;Acute pain   OT Problem List: Decreased strength;Decreased activity tolerance;Decreased range of motion;Impaired balance (sitting and/or standing);Decreased knowledge of use of DME or AE;Decreased knowledge of precautions;Obesity;Pain   OT Treatment/Interventions: Self-care/ADL training;Therapeutic exercise;DME and/or AE instruction;Therapeutic activities;Patient/family education;Balance training    OT Goals(Current goals can be found in the care plan section) Acute Rehab OT Goals Patient Stated Goal: not stated OT Goal Formulation: With patient Time For Goal Achievement: 11/16/13 Potential to Achieve Goals: Good ADL Goals Pt Will Perform Lower Body Bathing: with supervision;with adaptive equipment;sit to/from stand Pt Will Perform Lower Body Dressing: with supervision;with adaptive equipment;sit to/from stand Pt Will Transfer to Toilet: with supervision;ambulating (3n1 over toilet) Pt Will Perform Toileting - Clothing Manipulation and hygiene: with supervision;with adaptive equipment;sit to/from stand Pt Will Perform Tub/Shower Transfer: with supervision;ambulating;3 in 1;rolling walker Additional ADL Goal #1: Pt will perform bed mobility at supervision level while observing knee precautions to prepare for OOB ADLs.  OT Frequency: Min 3X/week   Barriers to D/C: Decreased caregiver support  only intermittent help available       Co-evaluation              End of Session Equipment Utilized During Treatment: Gait belt;Rolling walker  Activity Tolerance: Patient limited by pain Patient left: in chair;with call bell/phone within reach   Time: 9622-2979 OT Time Calculation (min): 24 min Charges:  OT General Charges $OT Visit: 1  Procedure OT Evaluation $Initial OT Evaluation Tier I: 1 Procedure G-Codes:    Hortencia Pilar 11/10/2013, 10:42 AM

## 2013-11-09 NOTE — Care Management Note (Signed)
CARE MANAGEMENT NOTE 11/09/2013  Patient:  Robert Gibbs, Robert Gibbs   Account Number:  [id number]  Date Initiated:  11/09/2013  Documentation initiated by:  Ricki Miller  Subjective/Objective Assessment:   63 yr old male s/p right total knee arthroplasty.     Action/Plan:   Patient is for shortterm rehab at [facility], wants [facility]. Social worker, Amy Lia Foyer is aware.  Was preoperatively setup with [place].   Anticipated DC Date:  11/10/2013   Anticipated DC Plan:  SKILLED NURSING FACILITY  In-house referral  Clinical Social Worker      DC Planning Services  CM consult      [facility] Choice  NA   Choice offered to / List presented to:     DME arranged  NA        [place] arranged  NA      Status of service:  Completed, signed off Medicare Important Message given?  NA - LOS <3 / Initial given by admissions (If response is "NO", the following Medicare IM given date fields will be blank) Date Medicare IM given:   Date Additional Medicare IM given:    Discharge Disposition:  [place]

## 2013-11-09 NOTE — Discharge Summary (Signed)
Patient ID: Robert Gibbs MRN: [id number] DOB/AGE: [birth year] 63 y.o.  Admit date: 11/07/2013 Discharge date: 11/09/2013  Admission Diagnoses:  Principal Problem:   Arthritis of knee, right Active Problems:   Arthritis of right knee   Discharge Diagnoses:  Same  Past Medical History  Diagnosis Date  . GERD (gastroesophageal reflux disease) 03/18/2012  . HTN (hypertension)   . Anxiety and depression   . Vitamin D deficiency   . Spinal stenosis 06-2012    saw neuro w/ "neuropathy", dx w/ spinal stenosis  . Hyperlipidemia   . Erectile dysfunction   . Varicose veins   . CTS (carpal tunnel syndrome)     h/o  . Barrett esophagus   . Wears glasses   . OSA (obstructive sleep apnea) 03/18/2012    on BiPap  . Diabetes mellitus without complication     prediabetic  . H/O hiatal hernia   . Headache(784.0)     sinus  . Arthritis   . BPH (benign prostatic hyperplasia)   . Edema of both legs     legs , ankles and feet    Surgeries: Procedure(s): TOTAL KNEE ARTHROPLASTY on 11/07/2013   Consultants:    Discharged Condition: Improved  Hospital Course: SHMIEL MORTON is an 63 y.o. male who was admitted 11/07/2013 for operative treatment ofArthritis of knee, right. Patient has severe unremitting pain that affects sleep, daily activities, and work/hobbies. After pre-op clearance the patient was taken to the operating room on 11/07/2013 and underwent  Procedure(s): TOTAL KNEE ARTHROPLASTY.    Patient was given perioperative antibiotics: Anti-infectives   Start     Dose/Rate Route Frequency Ordered Stop   11/07/13 0820  cefUROXime (ZINACEF) injection  Status:  Discontinued       As needed 11/07/13 0820 11/07/13 0940   11/06/13 0957  ceFAZolin (ANCEF) 3 g in dextrose 5 % 50 mL IVPB  Status:  Discontinued     3 g 160 mL/hr over 30 Minutes Intravenous On call to O.R. 11/06/13 0957 11/07/13 1221       Patient was given sequential compression devices, early ambulation, and  chemoprophylaxis to prevent DVT.  Patient benefited maximally from hospital stay and there were no complications.    Recent vital signs: Patient Vitals for the past 24 hrs:  BP Temp Temp src Pulse Resp SpO2  11/09/13 0552 147/73 mmHg 99.4 F (37.4 C) - 87 18 96 %  11/09/13 0011 - - - 92 16 96 %  11/08/13 2013 148/69 mmHg 100.1 F (37.8 C) Oral 90 20 96 %  11/08/13 1600 - - - - 18 -  11/08/13 1440 138/65 mmHg 99.6 F (37.6 C) Oral - 18 96 %     Recent laboratory studies:  Recent Labs  11/08/13 1000 11/09/13 0640  WBC 10.0 9.7  HGB 12.5* 11.8*  HCT 36.2* 35.3*  PLT 238 191     Discharge Medications:     Medication List    STOP taking these medications       aspirin 81 MG tablet  Replaced by:  aspirin EC 325 MG tablet     HYDROcodone-acetaminophen 5-325 MG per tablet  Commonly known as:  NORCO      TAKE these medications       ALPRAZolam 0.5 MG tablet  Commonly known as:  XANAX  Take 0.5 mg by mouth at bedtime as needed for sleep.     aspirin EC 325 MG tablet  Take 1 tablet (325 mg total) by mouth 2 (  two) times daily.     b complex vitamins capsule  Take 1 capsule by mouth daily.     buPROPion 300 MG 24 hr tablet  Commonly known as:  WELLBUTRIN XL  Take 300 mg by mouth daily.     CALCIUM 1200 1200-1000 MG-UNIT Chew  Chew 1 tablet by mouth 2 (two) times daily.     Coenzyme Q10 200 MG capsule  Take 200 mg by mouth daily.     FLEX OMEGA BENEFITS/VIT D-3 PO  Take 2 capsules by mouth daily.     furosemide 20 MG tablet  Commonly known as:  LASIX  Take 1 tablet (20 mg total) by mouth daily.     gabapentin 800 MG tablet  Commonly known as:  NEURONTIN  Take 1 tablet (800 mg total) by mouth 4 (four) times daily.     lisinopril 40 MG tablet  Commonly known as:  PRINIVIL,ZESTRIL  Take 40 mg by mouth daily.     magnesium oxide 400 MG tablet  Commonly known as:  MAG-OX  Take 400 mg by mouth daily.     Melatonin 5 MG Caps  Take 2 capsules by mouth at  bedtime. Patient stated that he is taking 10 mg daily     methocarbamol 500 MG tablet  Commonly known as:  ROBAXIN  Take 1 tablet (500 mg total) by mouth 2 (two) times daily with a meal.     metoprolol succinate 100 MG 24 hr tablet  Commonly known as:  TOPROL-XL  Take 100 mg by mouth daily. Take with or immediately following a meal.     multivitamin with minerals tablet  Take 1 tablet by mouth daily.     omeprazole 40 MG capsule  Commonly known as:  PRILOSEC  Take 1 capsule (40 mg total) by mouth 2 (two) times daily.     oxyCODONE-acetaminophen 5-325 MG per tablet  Commonly known as:  ROXICET  Take 1 tablet by mouth every 4 (four) hours as needed.     PRESCRIPTION MEDICATION  Apply 1 application topically 4 (four) times daily as needed (for pain).     PROBIOTIC DAILY PO  Take 1 capsule by mouth daily.     vitamin C 1000 MG tablet  Take 1,000 mg by mouth daily.        Diagnostic Studies: Dg Chest 2 View  10/28/2013   CLINICAL DATA:  Preop for right knee replacement  EXAM: CHEST  2 VIEW  COMPARISON:  None.  FINDINGS: Cardiomediastinal silhouette is unremarkable. No acute infiltrate or pleural effusion. No pulmonary edema. Mild degenerative changes thoracic spine.  IMPRESSION: No active cardiopulmonary disease.   Electronically Signed   By: Lahoma Crocker M.D.   On: 10/28/2013 14:25    Disposition: 01-Home or Self Care      Discharge Instructions   CPM    Complete by:  As directed   Continuous passive motion machine (CPM):      Use the CPM from 0 to 60  for 5 hours per day.      You may increase by 10 degrees per day.  You may break it up into 2 or 3 sessions per day.      Use CPM for 2 weeks or until you are told to stop.     Call MD / Call 911    Complete by:  As directed   If you experience chest pain or shortness of breath, CALL 911 and be transported to the hospital emergency  room.  If you develope a fever above 101 F, pus (white drainage) or increased drainage or  redness at the wound, or calf pain, call your surgeon's office.     Change dressing    Complete by:  As directed   Change dressing on 5, then change the dressing daily with sterile 4 x 4 inch gauze dressing and apply TED hose.  You may clean the incision with alcohol prior to redressing.     Constipation Prevention    Complete by:  As directed   Drink plenty of fluids.  Prune juice may be helpful.  You may use a stool softener, such as Colace (over the counter) 100 mg twice a day.  Use MiraLax (over the counter) for constipation as needed.     Diet - low sodium heart healthy    Complete by:  As directed      Discharge instructions    Complete by:  As directed   Follow up in office with Dr. Mayer Camel in 2 weeks.     Driving restrictions    Complete by:  As directed   No driving for 2 weeks     Increase activity slowly as tolerated    Complete by:  As directed      Patient may shower    Complete by:  As directed   You may shower without a dressing once there is no drainage.  Do not wash over the wound.  If drainage remains, cover wound with plastic wrap and then shower.           Follow-up Information   Follow up with Kerin Salen, MD In 2 weeks.   Specialty:  Orthopedic Surgery   Contact information:   [place] [address] [phone]        Signed: Leighton Parody 11/09/2013, 12:43 PM

## 2013-11-09 NOTE — Progress Notes (Signed)
Physical Therapy Treatment Patient Details Name: Robert Gibbs MRN: [id number] DOB: 1951/05/14 Today's Date: 11/09/2013    History of Present Illness Admitted with OA, s/p R TKA    PT Comments    POD # 2 pm session.  Assisted pt OOB to amb in hallway limited distance then to recliner to perform TKR TE's followed by ICE.    Follow Up Recommendations  SNF ([facility])     Equipment Recommendations  None recommended by PT    Recommendations for Other Services       Precautions / Restrictions Precautions Precautions: Knee Restrictions Weight Bearing Restrictions: No RLE Weight Bearing: Weight bearing as tolerated    Mobility  Bed Mobility Overal bed mobility: Needs Assistance Bed Mobility: Supine to Sit     Supine to sit: Min assist     General bed mobility comments: Min Assist to support R LE off bed plus increased time.  Transfers Overall transfer level: Needs assistance Equipment used: Rolling walker (2 wheeled) Transfers: Sit to/from Stand Sit to Stand: Min assist         General transfer comment: increased time and one VC on proper hand placement.  Pt stated it was difficult for hime due too his Neuropathy.  Ambulation/Gait Ambulation/Gait assistance: Min assist Ambulation Distance (Feet): 48 Feet Assistive device: Rolling walker (2 wheeled) Gait Pattern/deviations: Step-to pattern;Decreased stance time - right Gait velocity: decreased   General Gait Details: 50% VC's to increase WB thru R LE and proper walker to self distance along with safety during turns and backward gait to recliner.     Stairs            Wheelchair Mobility    Modified Rankin (Stroke Patients Only)       Balance                                    Cognition                            Exercises   Total Knee Replacement TE's 10 reps B LE ankle pumps 10 reps towel squeezes 10 reps knee presses 10 reps heel slides  10 reps SAQ's 10  reps SLR's 10 reps ABD Followed by ICE     General Comments        Pertinent Vitals/Pain C/o 5/10 during TE's Pre medicated ICE applied    Home Living                      Prior Function            PT Goals (current goals can now be found in the care plan section) Progress towards PT goals: Progressing toward goals    Frequency  7X/week    PT Plan Discharge plan needs to be updated    Co-evaluation             End of Session Equipment Utilized During Treatment: Gait belt Activity Tolerance: Patient limited by pain Patient left: in chair;with call bell/phone within reach     Time: 1350-1429 PT Time Calculation (min): 39 min  Charges:  $Gait Training: 8-22 mins $Therapeutic Exercise: 8-22 mins $Therapeutic Activity: 8-22 mins                    G Codes:      Rica Koyanagi  PTA  [facility]  Acute  Rehab Pager      [phone]

## 2013-11-09 NOTE — Progress Notes (Signed)
Clinical social worker assisted with patient discharge to skilled nursing facility, [facility].  CSW addressed all family questions and concerns. CSW copied chart and added all important documents. CSW also set up patient transportation with [facility]. Clinical Social Worker will sign off for now as social work intervention is no longer needed.   Arihaan Bellucci, MSW, LCSWA [phone] 

## 2013-11-09 NOTE — Progress Notes (Signed)
Placed patient on auto CPAP machine with hospital tubing and patients nasal prongs. Patient tolerating well at this time.

## 2013-11-10 ENCOUNTER — Non-Acute Institutional Stay (SKILLED_NURSING_FACILITY): Payer: 59 | Admitting: Internal Medicine

## 2013-11-10 ENCOUNTER — Other Ambulatory Visit: Payer: Self-pay | Admitting: *Deleted

## 2013-11-10 ENCOUNTER — Encounter: Payer: Self-pay | Admitting: *Deleted

## 2013-11-10 DIAGNOSIS — K219 Gastro-esophageal reflux disease without esophagitis: Secondary | ICD-10-CM

## 2013-11-10 DIAGNOSIS — M1710 Unilateral primary osteoarthritis, unspecified knee: Secondary | ICD-10-CM

## 2013-11-10 DIAGNOSIS — IMO0002 Reserved for concepts with insufficient information to code with codable children: Secondary | ICD-10-CM

## 2013-11-10 DIAGNOSIS — I10 Essential (primary) hypertension: Secondary | ICD-10-CM

## 2013-11-10 DIAGNOSIS — M1711 Unilateral primary osteoarthritis, right knee: Secondary | ICD-10-CM

## 2013-11-10 DIAGNOSIS — K5900 Constipation, unspecified: Secondary | ICD-10-CM

## 2013-11-10 MED ORDER — ALPRAZOLAM 0.5 MG PO TABS: ORAL_TABLET | ORAL | Status: DC

## 2013-11-10 NOTE — Telephone Encounter (Signed)
[facility] 

## 2013-11-11 DIAGNOSIS — K5900 Constipation, unspecified: Secondary | ICD-10-CM | POA: Insufficient documentation

## 2013-11-11 NOTE — Progress Notes (Signed)
HISTORY & PHYSICAL  DATE: 11/10/2013   FACILITY: [facility]  LEVEL OF CARE: SNF (31)  ALLERGIES:  Allergies  Allergen Reactions  . Codeine Itching  . Nsaids     Acid Reflux  . Simvastatin     Mimics heart attack    CHIEF COMPLAINT:  Manage right knee osteoarthritis, hypotension and GERD  HISTORY OF PRESENT ILLNESS: Patient is a 63 year old Caucasian male.  KNEE OSTEOARTHRITIS: Patient had a history of pain and functional disability in the knee due to end-stage osteoarthritis and has failed nonsurgical conservative treatments. Patient had worsening of pain with activity and weight bearing, pain that interfered with activities of daily living & pain with passive range of motion. Therefore patient underwent total knee arthroplasty and tolerated the procedure well. Patient is admitted to this facility for sort short-term rehabilitation. Patient denies knee pain.  HTN: Pt 's HTN remains stable.  Denies CP, sob, DOE, headaches, dizziness or visual disturbances.  No complications from the medications currently being used.  Last BP : 137/78.  GERD: pt's GERD is stable.  Denies ongoing heartburn, abd. Pain, nausea or vomiting.  Currently on a PPI & tolerates it without any adverse reactions.  PAST MEDICAL HISTORY :  Past Medical History  Diagnosis Date  . GERD (gastroesophageal reflux disease) 03/18/2012  . HTN (hypertension)   . Anxiety and depression   . Vitamin D deficiency   . Spinal stenosis 06-2012    saw neuro w/ "neuropathy", dx w/ spinal stenosis  . Hyperlipidemia   . Erectile dysfunction   . Varicose veins   . CTS (carpal tunnel syndrome)     h/o  . Barrett esophagus   . Wears glasses   . OSA (obstructive sleep apnea) 03/18/2012    on BiPap  . Diabetes mellitus without complication     prediabetic  . H/O hiatal hernia   . Headache(784.0)     sinus  . Arthritis   . BPH (benign prostatic hyperplasia)   . Edema of both legs     legs ,  ankles and feet  . Anxiety   . Depression     PAST SURGICAL HISTORY: Past Surgical History  Procedure Laterality Date  . Elbow surgery  1992    for tennis elbow-rt  . Foot surgery      R, for plantar fasciitis  . Prostate biopsy      2010, (-)  . Back surgery  2002    L4-L5  . Hernia repair      x 2 as a child-ing   . Uvulopalatopharyngoplasty (uppp)/tonsillectomy/septoplasty  1990  . Colonoscopy    . Upper gastrointestinal endoscopy    . Arch bar removal    . Ligation / division saphenous vein      both legs  . Knee arthroscopy with meniscal repair Bilateral 05/04/2013    Procedure: BILATERAL KNEE ARTHROSCOPY WITH POSSIBLE CHONDROPLASTY AND MENISCECTOMY ;  Surgeon: Kerin Salen, MD;  Location: [facility];  Service: Orthopedics;  Laterality: Bilateral;  Right chrondroplasty, debridement, , partial medial menisectomy, removal of loose bodies.   . Tonsillectomy    . Eye surgery Right     cataract with lens implant  . 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 Total knee arthroplasty Right 11/07/2013    Procedure: TOTAL KNEE ARTHROPLASTY;  Surgeon: Kerin Salen, MD;  Location: [facility];  Service: Orthopedics;  Laterality: Right;  SOCIAL HISTORY:  reports that he quit smoking about 5 years ago. He has never used smokeless tobacco. He reports that he drinks alcohol. He reports that he does not use illicit drugs.  FAMILY HISTORY:  Family History  Problem Relation Age of Onset  . Colon cancer Neg Hx   . Prostate cancer Neg Hx   . Diabetes Neg Hx   . Cancer Neg Hx   . CAD Father     ?    CURRENT MEDICATIONS: Reviewed per MAR/see medication list  REVIEW OF SYSTEMS:  GI: Complains of constipation ,See HPI otherwise 14 point ROS is negative.  PHYSICAL EXAMINATION  VS:  See VS section  GENERAL: no acute distress, moderately obese body habitus EYES: conjunctivae normal, sclerae normal, normal eye lids MOUTH/THROAT: lips without lesions,no  lesions in the mouth,tongue is without lesions,uvula elevates in midline NECK: supple, trachea midline, no neck masses, no thyroid tenderness, no thyromegaly LYMPHATICS: no LAN in the neck, no supraclavicular LAN RESPIRATORY: breathing is even & unlabored, BS CTAB CARDIAC: RRR, no murmur,no extra heart sounds, +3 right lower extremity edema GI:  ABDOMEN: abdomen soft, normal BS, no masses, no tenderness  LIVER/SPLEEN: no hepatomegaly, no splenomegaly MUSCULOSKELETAL: HEAD: normal to inspection  EXTREMITIES: LEFT UPPER EXTREMITY: full range of motion, normal strength & tone RIGHT UPPER EXTREMITY:  full range of motion, normal strength & tone LEFT LOWER EXTREMITY:  full range of motion, normal strength & tone RIGHT LOWER EXTREMITY:  range of motion not tested due to surgery, normal strength & tone PSYCHIATRIC: the patient is alert & oriented to person, affect & behavior appropriate  LABS/RADIOLOGY:  Labs reviewed: Basic Metabolic Panel:  Recent Labs  11/16/12 1113 05/02/13 1500 10/28/13 1256  NA 136 142 141  K 4.2 4.4 4.9  CL 100 103 102  CO2 27 29 26   GLUCOSE 104* 108* 96  BUN 16 14 16   CREATININE 0.9 0.80 0.80  CALCIUM 9.1 9.6 9.4   Liver Function Tests:  Recent Labs  11/16/12 1113 11/04/13 1106  AST 22 28  ALT 27 42  ALKPHOS 49  --   BILITOT 0.5  --   PROT 7.0  --   ALBUMIN 3.9  --    CBC:  Recent Labs  11/16/12 1113  10/28/13 1256 11/08/13 1000 11/09/13 0640  WBC 7.1  --  7.3 10.0 9.7  NEUTROABS 3.5  --  3.3  --   --   HGB 14.9  < > 14.0 12.5* 11.8*  HCT 42.0  --  40.6 36.2* 35.3*  MCV 98.0  --  100.5* 100.8* 101.7*  PLT 221.0  --  188 238 191  < > = values in this interval not displayed. Lipid Panel:  Recent Labs  11/16/12 1113 11/04/13 1106  HDL 49.20 42.20   CBG:  Recent Labs  11/07/13 1238 11/08/13 0626 11/09/13 0642  GLUCAP 135* 108* 136*    CHEST  2 VIEW   COMPARISON:  None.   FINDINGS: Cardiomediastinal silhouette is  unremarkable. No acute infiltrate or pleural effusion. No pulmonary edema. Mild degenerative changes thoracic spine.   IMPRESSION: No active cardiopulmonary disease  ASSESSMENT/PLAN:  Right knee osteoarthritis-status post total knee arthroplasty. Continue rehabilitation. Hypertension-well controlled GERD-stable Constipation-new problem. Start MiraLax 17 g daily. Acute blood loss anemia-check hemoglobin level Macrocytosis-check RBC folate and vitamin B12 level  Depression-denies ongoing symptoms Check CBC  I have reviewed patient's medical records received at admission/from hospitalization.  CPT CODE: 26834  Merlene Laughter, MD [facility] [phone]

## 2013-11-16 ENCOUNTER — Telehealth: Payer: Self-pay | Admitting: Internal Medicine

## 2013-11-16 NOTE — Telephone Encounter (Signed)
Patient called for a nurse to go over his recent lab work. Please advise.

## 2013-11-17 NOTE — Telephone Encounter (Signed)
Left message on voice mail for the pt or his family to return my call

## 2013-11-18 ENCOUNTER — Non-Acute Institutional Stay (SKILLED_NURSING_FACILITY): Payer: 59 | Admitting: Adult Health

## 2013-11-18 DIAGNOSIS — K5900 Constipation, unspecified: Secondary | ICD-10-CM

## 2013-11-18 DIAGNOSIS — G629 Polyneuropathy, unspecified: Secondary | ICD-10-CM

## 2013-11-18 DIAGNOSIS — F329 Major depressive disorder, single episode, unspecified: Secondary | ICD-10-CM

## 2013-11-18 DIAGNOSIS — M1711 Unilateral primary osteoarthritis, right knee: Secondary | ICD-10-CM

## 2013-11-18 DIAGNOSIS — IMO0002 Reserved for concepts with insufficient information to code with codable children: Secondary | ICD-10-CM

## 2013-11-18 DIAGNOSIS — M1710 Unilateral primary osteoarthritis, unspecified knee: Secondary | ICD-10-CM

## 2013-11-18 DIAGNOSIS — I10 Essential (primary) hypertension: Secondary | ICD-10-CM

## 2013-11-18 DIAGNOSIS — F32A Depression, unspecified: Secondary | ICD-10-CM

## 2013-11-18 DIAGNOSIS — F3289 Other specified depressive episodes: Secondary | ICD-10-CM

## 2013-11-18 DIAGNOSIS — K219 Gastro-esophageal reflux disease without esophagitis: Secondary | ICD-10-CM

## 2013-11-18 DIAGNOSIS — G589 Mononeuropathy, unspecified: Secondary | ICD-10-CM

## 2013-11-18 NOTE — Telephone Encounter (Signed)
Closing note until pt of family return my call

## 2013-11-18 NOTE — Telephone Encounter (Signed)
Pt returned call to [place].  Call home number [phone]

## 2013-11-18 NOTE — Telephone Encounter (Signed)
Left another message for pt or family to return my call.

## 2013-11-20 ENCOUNTER — Encounter: Payer: Self-pay | Admitting: Adult Health

## 2013-11-20 DIAGNOSIS — G629 Polyneuropathy, unspecified: Secondary | ICD-10-CM | POA: Insufficient documentation

## 2013-11-20 NOTE — Progress Notes (Signed)
This encounter was created in error - please disregard.

## 2013-11-20 NOTE — Progress Notes (Signed)
Patient ID: Robert Gibbs, male   DOB: 22-Jul-1950, 63 y.o.   MRN: [id number]              PROGRESS NOTE  DATE: 11/18/2013   FACILITY: [facility]  LEVEL OF CARE: SNF (31)  Acute Visit  CHIEF COMPLAINT:  Discharge Notes  HISTORY OF PRESENT ILLNESS: This is a 63 year old male who is for discharge home with Home health PT, OT and Nursing. DME: Rolling walker and Bedside commode. 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 Patient was admitted to this facility for short-term rehabilitation after the patient's recent hospitalization.  Patient has completed SNF rehabilitation and therapy has cleared the patient for discharge.  Reassessment of ongoing problem(s):  HTN: Pt 's HTN remains stable.  Denies CP, sob, DOE, pedal edema, headaches, dizziness or visual disturbances.  No complications from the medications currently being used.  Last BP : 135/70  PERIPHERAL NEUROPATHY: The peripheral neuropathy is stable. The patient denies pain in the feet, tingling, and numbness. No complications noted from the medication presently being used.  DEPRESSION: The depression remains stable. Patient denies ongoing feelings of sadness, insomnia, anedhonia or lack of appetite. No complications reported from the medications currently being used. Staff do not report behavioral problems.  PAST MEDICAL HISTORY : Reviewed.  No changes/see problem list  CURRENT MEDICATIONS: Reviewed per MAR/see medication list  REVIEW OF SYSTEMS:  GENERAL: no change in appetite, no fatigue, no weight changes, no fever, chills or weakness RESPIRATORY: no cough, SOB, DOE, wheezing, hemoptysis CARDIAC: no chest pain, or palpitations, + edema GI: no abdominal pain, diarrhea, constipation, heart burn, nausea or vomiting  PHYSICAL EXAMINATION  GENERAL: no acute distress, normal body habitus EYES: conjunctivae normal,  sclerae normal, normal eye lids NECK: supple, trachea midline, no neck masses, no thyroid tenderness, no thyromegaly LYMPHATICS: no LAN in the neck, no supraclavicular LAN RESPIRATORY: breathing is even & unlabored, BS CTAB CARDIAC: RRR, no murmur,no extra heart sounds, BLE edema 2+ GI: abdomen soft, normal BS, no masses, no tenderness, no hepatomegaly, no splenomegaly EXTREMITIES:  Able to move all 4 extremities; ambulates with walker  PSYCHIATRIC: the patient is alert & oriented to person, affect & behavior appropriate  LABS/RADIOLOGY: Labs reviewed: Basic Metabolic Panel:  Recent Labs  05/02/13 1500 10/28/13 1256  NA 142 141  K 4.4 4.9  CL 103 102  CO2 29 26  GLUCOSE 108* 96  BUN 14 16  CREATININE 0.80 0.80  CALCIUM 9.6 9.4   Liver Function Tests:  Recent Labs  11/04/13 1106  AST 28  ALT 42   CBC:  Recent Labs  10/28/13 1256 11/08/13 1000 11/09/13 0640  WBC 7.3 10.0 9.7  NEUTROABS 3.3  --   --   HGB 14.0 12.5* 11.8*  HCT 40.6 36.2* 35.3*  MCV 100.5* 100.8* 101.7*  PLT 188 238 191   Lipid Panel:  Recent Labs  11/04/13 1106  HDL 42.20   CBG:  Recent Labs  11/07/13 1238 11/08/13 0626 11/09/13 0642  GLUCAP 135* 108* 136*   CLINICAL DATA:  Preop for right knee replacement   EXAM: CHEST  2 VIEW   COMPARISON:  None.   FINDINGS: Cardiomediastinal silhouette is unremarkable. No acute infiltrate or pleural effusion. No pulmonary edema. Mild degenerative changes thoracic spine.   IMPRESSION: No active cardiopulmonary disease.   ASSESSMENT/PLAN:  Right knee arthritis S/P right total knee replacement -  for home health PT, OT and nursing Hypertension - well controlled; continue lisinopril and Toprol-XL Depression - stable; continue Wellbutrin Neuropathy - stable; continue Neurontin GERD - continue Prilosec Constipation - continue MiraLax   I have filled out patient's discharge paperwork and written prescriptions.  Patient will receive  home health PT, OT and Nursing.   DME provided: Rolling walker and Bedside commode  Total discharge time: Greater than 30 minutes Discharge time involved coordination of the discharge process with Education officer, museum, nursing staff and therapy department. Medical justification for home health services/DME verified.  CPT CODE: 29924  Seth Bake - NP [facility] [phone]

## 2013-11-21 ENCOUNTER — Telehealth: Payer: Self-pay | Admitting: *Deleted

## 2013-11-21 NOTE — Telephone Encounter (Signed)
Pt states had total knee arthoplasty feeling very anxious and confined , wants to know if theres another medication he can take for anxiety.  Pt also requesting for xanax 0.5mg  in 90 day supply  Last refilled- 11/10/13 #30 / 5 rf  Last OV- 11/04/13  UDS- 11/16/12

## 2013-11-21 NOTE — Telephone Encounter (Signed)
Ashlee, Recommend Lexapro 5 mg one by mouth daily #30 and one refill Recommend to use the prescription for Xanax that we just issued. Followup in 2- 3 weeks, we'll  see how he is doing at that time. If symptoms severe or suicidal ideas, needs to call or come back sooner

## 2013-11-22 MED ORDER — ESCITALOPRAM OXALATE 5 MG PO TABS: 5.0000 mg | ORAL_TABLET | Freq: Every day | ORAL | Status: DC

## 2013-11-22 NOTE — Telephone Encounter (Signed)
Dr. Ethel Rana recommendations were discussed with Robert Gibbs.  Robert Gibbs stated understanding and agreed with plan.  He [facility] [address] and a prescription for the Xanax be sent as a 90 day supply to [facility].  He stated that he would call back to make the follow-up appointment.  Robert Gibbs also had some questions regarding his most recent labs.  Labs were discussed with patient and his questions were answered.    Lexapro prescription as ordered by the provider [facility] [address] as requested by the patient.   Xanax prescription was written by another provider Janett Billow Ardine Eng, NP), therefore Robert Gibbs was encouraged to call that provider to have them resend the prescription to [facility] to reflect the 90 day supply request.  Robert Gibbs stated that this prescription may have been written by the provider at [facility] and that he could possibly have the prescription with him at home.  He stated that he would look through his paperwork to see if he has the prescription.  If he does he will send it to [facility].  If he does not, he will notify [facility].    No further questions or concerns were voiced.

## 2013-11-30 ENCOUNTER — Telehealth: Payer: Self-pay | Admitting: *Deleted

## 2013-11-30 NOTE — Telephone Encounter (Signed)
UDS MODERATE RISK 11/04/13

## 2013-12-06 ENCOUNTER — Encounter: Payer: Self-pay | Admitting: Internal Medicine

## 2013-12-16 ENCOUNTER — Other Ambulatory Visit: Payer: Self-pay | Admitting: Internal Medicine

## 2013-12-16 ENCOUNTER — Telehealth: Payer: Self-pay | Admitting: *Deleted

## 2013-12-16 NOTE — Telephone Encounter (Signed)
Caller name:  Darrie Relation to pt:  self Call back number: [phone]  Pharmacy:  [facility]  Reason for call:   Pt called to request refill on  omeprazole (PRILOSEC) 40 MG capsule  Last filled 12/07/2012, #180, 2 refills Last OV 11/04/2013  Pt states to make sure it is for 90 day supply, which is (1) tablet twice a day, which is 180 tablets.  Had trouble with different manufacturers, the only one that works is Dr. Ephriam Jenkins.  Please put Dr. Reddy's Only on prescription.

## 2013-12-16 NOTE — OR Nursing (Signed)
Addendum to scope page 

## 2013-12-19 MED ORDER — OMEPRAZOLE 40 MG PO CPDR: DELAYED_RELEASE_CAPSULE | ORAL | Status: DC

## 2013-12-19 NOTE — Telephone Encounter (Signed)
rx sent

## 2013-12-21 ENCOUNTER — Telehealth: Payer: Self-pay | Admitting: Internal Medicine

## 2013-12-21 NOTE — Telephone Encounter (Signed)
Caller name: Fread Relation to pt: Call back number: [phone] Pharmacy: [facility]  Reason for call:   PT is needing the RX omeprazole (PRILOSEC) 40 MG capsule  To be resent to the [facility] and have the notations  Must be DR. REDDY'S FORMULA ONLY or they will not fill it correctly.

## 2013-12-22 MED ORDER — OMEPRAZOLE 40 MG PO CPDR: DELAYED_RELEASE_CAPSULE | ORAL | Status: DC

## 2013-12-22 NOTE — Telephone Encounter (Signed)
rx resent again as  "DR. REDDY'S FORMULA ONLY" to [facility] .Marland KitchenMarland Kitchen

## 2013-12-26 ENCOUNTER — Telehealth: Payer: Self-pay | Admitting: *Deleted

## 2013-12-26 NOTE — Telephone Encounter (Signed)
[facility] who stated that they do not carry the Dr. Ephriam Jenkins formula of omeprazole. Patient made aware.

## 2013-12-26 NOTE — Telephone Encounter (Signed)
Caller name:  Camilo Relation to pt:  self Call back number: [phone]  [facility]  Reason for call:   Pt called requesting temp supply of omeprazole (PRILOSEC) 40 MG capsule to be sent [facility] if they have DR. REDDY'S FORMULA.  [facility] sent the wrong thing, but has received correct prescription, but he will not get it before he runs out.  He has 2 left.  If [facility] can not get or does not have Dr. Ephriam Jenkins Formula, he does have Pepcid Complete to get him by.  Only send to [facility] if they can give him DR. REDDY'S FORMULA.  Please advise pt at the number above.

## 2013-12-27 ENCOUNTER — Ambulatory Visit: Payer: 59 | Attending: Orthopedic Surgery | Admitting: Physical Therapy

## 2013-12-27 DIAGNOSIS — M25569 Pain in unspecified knee: Secondary | ICD-10-CM | POA: Diagnosis not present

## 2013-12-27 DIAGNOSIS — M25669 Stiffness of unspecified knee, not elsewhere classified: Secondary | ICD-10-CM | POA: Diagnosis not present

## 2013-12-27 DIAGNOSIS — R262 Difficulty in walking, not elsewhere classified: Secondary | ICD-10-CM | POA: Diagnosis not present

## 2013-12-27 DIAGNOSIS — R609 Edema, unspecified: Secondary | ICD-10-CM | POA: Insufficient documentation

## 2014-01-10 ENCOUNTER — Ambulatory Visit: Payer: 59 | Admitting: Physical Therapy

## 2014-01-10 DIAGNOSIS — M25569 Pain in unspecified knee: Secondary | ICD-10-CM | POA: Diagnosis not present

## 2014-01-16 ENCOUNTER — Ambulatory Visit: Payer: 59 | Admitting: Physical Therapy

## 2014-01-16 DIAGNOSIS — M25569 Pain in unspecified knee: Secondary | ICD-10-CM | POA: Diagnosis not present

## 2014-01-17 ENCOUNTER — Telehealth: Payer: Self-pay | Admitting: Internal Medicine

## 2014-01-17 NOTE — Telephone Encounter (Signed)
Caller name: Jahaan  Relation to VH:QION Call back [phone] [id number] [place], [place]   Reason for call: pt wants re-fill on escitalopram (LEXAPRO) 5 MG tablet. He would like the next higher dosage, he says the 5 mg is ineffective.

## 2014-01-17 NOTE — Telephone Encounter (Signed)
OK to give him Lexapro 10 mg po daily disp #30 but no refills til seen by Dr Larose Kells

## 2014-01-18 ENCOUNTER — Other Ambulatory Visit: Payer: Self-pay | Admitting: Internal Medicine

## 2014-01-18 MED ORDER — ESCITALOPRAM OXALATE 10 MG PO TABS: 10.0000 mg | ORAL_TABLET | Freq: Every day | ORAL | Status: DC

## 2014-01-18 NOTE — Telephone Encounter (Signed)
rx sent

## 2014-01-19 ENCOUNTER — Ambulatory Visit: Payer: 59 | Admitting: Physical Therapy

## 2014-01-19 DIAGNOSIS — M25569 Pain in unspecified knee: Secondary | ICD-10-CM | POA: Diagnosis not present

## 2014-01-20 ENCOUNTER — Telehealth: Payer: Self-pay | Admitting: *Deleted

## 2014-01-20 NOTE — Telephone Encounter (Signed)
rx refill- xanax 0.5mg  requesting 90 day supply Last ov- 11/04/13  Last refilled- 11/10/13 # 30 / 5 rf  UDS- 11/16/12 Low risk.

## 2014-01-20 NOTE — Telephone Encounter (Signed)
It is too soon for refill if his prescriptions are considered a 30 day supply so I think this should wait for his PMD to decide

## 2014-01-23 MED ORDER — ALPRAZOLAM 0.5 MG PO TABS: ORAL_TABLET | ORAL | Status: DC

## 2014-01-23 NOTE — Telephone Encounter (Signed)
[facility]. Pt to come in for UDS during his f/u appointment 02/06/14 .

## 2014-01-23 NOTE — Telephone Encounter (Signed)
1. Robert Gibbs, last UDS was moderate risk, he is due for a repeat UDS, please arrange 2. Prescription for 90 tablets printed 3. Call Pharmacy and cancel  Last rx  (was written 30 and 5 RF)

## 2014-01-24 ENCOUNTER — Ambulatory Visit: Payer: 59 | Attending: Orthopedic Surgery | Admitting: Physical Therapy

## 2014-01-24 DIAGNOSIS — R262 Difficulty in walking, not elsewhere classified: Secondary | ICD-10-CM | POA: Insufficient documentation

## 2014-01-24 DIAGNOSIS — M25569 Pain in unspecified knee: Secondary | ICD-10-CM | POA: Diagnosis not present

## 2014-01-24 DIAGNOSIS — R609 Edema, unspecified: Secondary | ICD-10-CM | POA: Insufficient documentation

## 2014-01-24 DIAGNOSIS — M25669 Stiffness of unspecified knee, not elsewhere classified: Secondary | ICD-10-CM | POA: Insufficient documentation

## 2014-01-26 ENCOUNTER — Ambulatory Visit: Payer: 59 | Admitting: Physical Therapy

## 2014-01-26 DIAGNOSIS — M25569 Pain in unspecified knee: Secondary | ICD-10-CM | POA: Diagnosis not present

## 2014-01-31 ENCOUNTER — Ambulatory Visit: Payer: 59 | Admitting: Physical Therapy

## 2014-02-02 ENCOUNTER — Ambulatory Visit: Payer: 59 | Admitting: Physical Therapy

## 2014-02-02 DIAGNOSIS — M25569 Pain in unspecified knee: Secondary | ICD-10-CM | POA: Diagnosis not present

## 2014-02-06 ENCOUNTER — Encounter: Payer: Self-pay | Admitting: Internal Medicine

## 2014-02-06 ENCOUNTER — Ambulatory Visit (INDEPENDENT_AMBULATORY_CARE_PROVIDER_SITE_OTHER): Payer: 59 | Admitting: Internal Medicine

## 2014-02-06 ENCOUNTER — Telehealth: Payer: Self-pay

## 2014-02-06 VITALS — BP 161/80 | HR 83 | Temp 99.1°F | Wt 275.5 lb

## 2014-02-06 DIAGNOSIS — I10 Essential (primary) hypertension: Secondary | ICD-10-CM

## 2014-02-06 DIAGNOSIS — F419 Anxiety disorder, unspecified: Principal | ICD-10-CM

## 2014-02-06 DIAGNOSIS — F329 Major depressive disorder, single episode, unspecified: Secondary | ICD-10-CM

## 2014-02-06 DIAGNOSIS — F341 Dysthymic disorder: Secondary | ICD-10-CM

## 2014-02-06 LAB — BASIC METABOLIC PANEL
BUN: 18 mg/dL (ref 6–23)
CHLORIDE: 103 meq/L (ref 96–112)
CO2: 28 meq/L (ref 19–32)
Calcium: 9.1 mg/dL (ref 8.4–10.5)
Creatinine, Ser: 0.9 mg/dL (ref 0.4–1.5)
GFR: 89.35 mL/min (ref >60.00)
Glucose, Bld: 87 mg/dL (ref 70–99)
Potassium: 4.2 mEq/L (ref 3.5–5.1)
Sodium: 139 mEq/L (ref 135–145)

## 2014-02-06 MED ORDER — ESCITALOPRAM OXALATE 10 MG PO TABS: 10.0000 mg | ORAL_TABLET | Freq: Every day | ORAL | Status: DC

## 2014-02-06 NOTE — Assessment & Plan Note (Signed)
Since the last visit, he got more depressed, started Lexapro 5 mg, then dose was increased to 10 mg approximately 3 weeks ago, since then he's doing a little better. Plan: Continue with Wellbutrin, Lexapro, call if in few more weeks he is not satisfactorily controlled Also recommend to see a counselor as a important part of the treatment

## 2014-02-06 NOTE — Patient Instructions (Signed)
Get your blood work before you leave   Consider see a counselor  Check the  blood pressure 2 or 3 times a   week be sure it is between 110/60 and 140/85. Ideal blood pressure is 120/80. If it is consistently higher or lower, let me know   Call in 3-4 weeks if you feel we need to increase lexapro    Next visit is for routine check up in 3-4 months

## 2014-02-06 NOTE — Progress Notes (Signed)
Subjective:    Patient ID: Robert Gibbs, male    DOB: 07/15/1950, 63 y.o.   MRN: [id number]  DOS:  02/06/2014 Type of visit - description: f/u History: Since last time he was here, he had knee surgery and that went well. He also has anxiety and depression, was a started on Lexapro 5 mg, subsequently dose was increased to 10 mg, with a new dose he feels some better    ROS Denies any suicidal ideas No nausea, vomiting, diarrhea. No chest pain or difficulty breathing  Past Medical History  Diagnosis Date  . GERD (gastroesophageal reflux disease) 03/18/2012  . HTN (hypertension)   . Anxiety and depression   . Vitamin D deficiency   . Spinal stenosis 06-2012    saw neuro w/ "neuropathy", dx w/ spinal stenosis  . Hyperlipidemia   . Erectile dysfunction   . Varicose veins   . CTS (carpal tunnel syndrome)     h/o  . Barrett esophagus   . Wears glasses   . OSA (obstructive sleep apnea) 03/18/2012    on BiPap  . Diabetes mellitus without complication     prediabetic  . H/O hiatal hernia   . Headache(784.0)     sinus  . Arthritis   . BPH (benign prostatic hyperplasia)   . Edema of both legs     legs , ankles and feet  . Anxiety   . Depression     Past Surgical History  Procedure Laterality Date  . Elbow surgery  1992    for tennis elbow-rt  . Foot surgery      R, for plantar fasciitis  . Prostate biopsy      2010, (-)  . Back surgery  2002    L4-L5  . Hernia repair      x 2 as a child-ing   . Uvulopalatopharyngoplasty (uppp)/tonsillectomy/septoplasty  1990  . Colonoscopy    . Upper gastrointestinal endoscopy    . Arch bar removal    . Ligation / division saphenous vein      both legs  . Knee arthroscopy with meniscal repair Bilateral 05/04/2013    Procedure: BILATERAL KNEE ARTHROSCOPY WITH POSSIBLE CHONDROPLASTY AND MENISCECTOMY ;  Surgeon: Kerin Salen, MD;  Location: [facility];  Service: Orthopedics;  Laterality: Bilateral;  Right  chrondroplasty, debridement, , partial medial menisectomy, removal of loose bodies.   . Tonsillectomy    . Eye surgery Right     cataract with lens implant  . 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 Total knee arthroplasty Right 11/07/2013    Procedure: TOTAL KNEE ARTHROPLASTY;  Surgeon: Kerin Salen, MD;  Location: [facility];  Service: Orthopedics;  Laterality: Right;    History   Social History  . Marital Status: Divorced    Spouse Name: N/A    Number of Children: 1  . Years of Education: N/A   Occupational History  . [employer]    Social History Main Topics  . Smoking status: Former Smoker -- 1.00 packs/day for 40 years    Quit date: 06/29/2008  . Smokeless tobacco: Never Used     Comment: quit 2010  . Alcohol Use: Yes     Comment: 2-3 large drinks, Vodka, 2-3 times a week  . Drug Use: No  . Sexual Activity: Not on file   Other Topics Concern  . Not on file   Social History Narrative   Lives w/  fiance  Medication List       This list is accurate as of: 02/06/14  7:14 PM.  Always use your most recent med list.               ALPRAZolam 0.5 MG tablet  Commonly known as:  XANAX  Take one tablet by mouth every night at bedtime as needed for sleep     aspirin EC 325 MG tablet  Take 1 tablet (325 mg total) by mouth 2 (two) times daily.     b complex vitamins capsule  Take 1 capsule by mouth daily.     buPROPion 300 MG 24 hr tablet  Commonly known as:  WELLBUTRIN XL  Take 300 mg by mouth daily.     CALCIUM 1200 1200-1000 MG-UNIT Chew  Chew 1 tablet by mouth 2 (two) times daily.     Coenzyme Q10 200 MG capsule  Take 200 mg by mouth daily.     escitalopram 10 MG tablet  Commonly known as:  LEXAPRO  Take 1 tablet (10 mg total) by mouth daily.     FLEX OMEGA BENEFITS/VIT D-3 PO  Take 2 capsules by mouth daily.     furosemide 20 MG tablet  Commonly known as:  LASIX  Take 1 tablet (20 mg total) by mouth daily.      gabapentin 800 MG tablet  Commonly known as:  NEURONTIN  Take 1 tablet (800 mg total) by mouth 4 (four) times daily.     lisinopril 40 MG tablet  Commonly known as:  PRINIVIL,ZESTRIL  TAKE 1 TABLET DAILY     magnesium oxide 400 MG tablet  Commonly known as:  MAG-OX  Take 400 mg by mouth daily.     Melatonin 5 MG Caps  Take 2 capsules by mouth at bedtime. Patient stated that he is taking 10 mg daily     methocarbamol 500 MG tablet  Commonly known as:  ROBAXIN  Take 1 tablet (500 mg total) by mouth 2 (two) times daily with a meal.     metoprolol succinate 100 MG 24 hr tablet  Commonly known as:  TOPROL-XL  Take 100 mg by mouth daily. Take with or immediately following a meal.     multivitamin with minerals tablet  Take 1 tablet by mouth daily.     omeprazole 40 MG capsule  Commonly known as:  PRILOSEC  TAKE 1 CAPSULE TWICE DAILY     oxyCODONE-acetaminophen 5-325 MG per tablet  Commonly known as:  ROXICET  Take 1 tablet by mouth every 4 (four) hours as needed.     PROBIOTIC DAILY PO  Take 1 capsule by mouth daily.     vitamin C 1000 MG tablet  Take 1,000 mg by mouth daily.           Objective:   Physical Exam BP 161/80  Pulse 83  Temp(Src) 99.1 F (37.3 C) (Oral)  Wt 275 lb 8 oz (124.966 kg)  SpO2 94% General -- alert, well-developed, NAD.  Extremities-- trace pretibial edema bilaterally  Neurologic--  alert & oriented X3. Speech normal, gait appropriate for age, strength symmetric and appropriate for age.  Psych-- Cognition and judgment appear intact. Cooperative with normal attention span and concentration. No anxious or depressed appearing.     Assessment & Plan:

## 2014-02-06 NOTE — Telephone Encounter (Signed)
Caller name:Bilbo Relation to pt: Call back number: Pharmacy:  Reason for call: Robert Gibbs called back and wanted to see if his cholesterol ck could be added to the labs from when he was here. He said he was fasting.

## 2014-02-06 NOTE — Progress Notes (Signed)
Pre-visit discussion using our clinic review tool. No additional management support is needed unless otherwise documented below in the visit note.  

## 2014-02-06 NOTE — Telephone Encounter (Signed)
Advise patient, we just checked 3 months ago and was ok , if he really likes it to be rechecked please add it on

## 2014-02-06 NOTE — Assessment & Plan Note (Signed)
BP slightly elevated today, no ambulatory BPs. Plan: Check a BMP, continue same medications, recommend to monitor his BP, see instructions

## 2014-02-06 NOTE — Telephone Encounter (Signed)
Spoke with patient and advised. Verbalizes that he will wait since it was taking 3 months ago.

## 2014-02-06 NOTE — Telephone Encounter (Signed)
Ok to add a lipid panel?

## 2014-02-07 ENCOUNTER — Telehealth: Payer: Self-pay | Admitting: Internal Medicine

## 2014-02-07 ENCOUNTER — Ambulatory Visit: Payer: 59 | Admitting: Physical Therapy

## 2014-02-07 DIAGNOSIS — M25569 Pain in unspecified knee: Secondary | ICD-10-CM | POA: Diagnosis not present

## 2014-02-07 NOTE — Telephone Encounter (Signed)
Relevant patient education assigned to patient using Emmi. ° °

## 2014-02-09 ENCOUNTER — Ambulatory Visit: Payer: 59 | Admitting: Physical Therapy

## 2014-02-09 ENCOUNTER — Other Ambulatory Visit: Payer: Self-pay | Admitting: Orthopedic Surgery

## 2014-02-09 DIAGNOSIS — M25569 Pain in unspecified knee: Secondary | ICD-10-CM | POA: Diagnosis not present

## 2014-02-14 ENCOUNTER — Ambulatory Visit: Payer: 59 | Admitting: Physical Therapy

## 2014-02-14 DIAGNOSIS — M25569 Pain in unspecified knee: Secondary | ICD-10-CM | POA: Diagnosis not present

## 2014-02-16 ENCOUNTER — Encounter (HOSPITAL_COMMUNITY): Payer: Self-pay | Admitting: Pharmacy Technician

## 2014-02-16 ENCOUNTER — Ambulatory Visit: Payer: 59 | Admitting: Physical Therapy

## 2014-02-16 DIAGNOSIS — M25569 Pain in unspecified knee: Secondary | ICD-10-CM | POA: Diagnosis not present

## 2014-02-17 NOTE — Pre-Procedure Instructions (Signed)
Robert Gibbs  02/17/2014   Your procedure is scheduled on:  Wednesday, September 9th  Report to [place] at 1045 AM.  Call this number if you have problems the morning of surgery: [phone]   Remember:   Do not eat food or drink liquids after midnight.   Take these medicines the morning of surgery with A SIP OF WATER: toprol, prilosec, wellbutrin, lexapro, neurontin, percocet if needed  Stop taking aspirin, OTC vitamins/herbal medications, NSAIDS (ibuprofen, advil, motrin) 7 days prior to surgery.   Do not wear jewelry.  Do not wear lotions, powders, or perfumes. You may wear deodorant.  Do not shave 48 hours prior to surgery. Men may shave face and neck.  Do not bring valuables to the hospital.  [facility] is not responsible for any belongings or valuables.               Contacts, dentures or bridgework may not be worn into surgery.  Leave suitcase in the car. After surgery it may be brought to your room.  For patients admitted to the hospital, discharge time is determined by your  treatment team.            Please read over the following fact sheets that you were given: Pain Booklet, Coughing and Deep Breathing, Blood Transfusion Information, MRSA Information and Surgical Site Infection Prevention [place] - Preparing for Surgery  Before surgery, you can play an important role.  Because skin is not sterile, your skin needs to be as free of germs as possible.  You can reduce the number of germs on you skin by washing with CHG (chlorahexidine gluconate) soap before surgery.  CHG is an antiseptic cleaner which kills germs and bonds with the skin to continue killing germs even after washing.  Please DO NOT use if you have an allergy to CHG or antibacterial soaps.  If your skin becomes reddened/irritated stop using the CHG and inform your nurse when you arrive at Short Stay.  Do not shave (including legs and underarms) for at least 48 hours prior to the  first CHG shower.  You may shave your face.  Please follow these instructions carefully:   1.  Shower with CHG Soap the night before surgery and the morning of Surgery.  2.  If you choose to wash your hair, wash your hair first as usual with your normal shampoo.  3.  After you shampoo, rinse your hair and body thoroughly to remove the shampoo.  4.  Use CHG as you would any other liquid soap.  You can apply CHG directly to the skin and wash gently with scrungie or a clean washcloth.  5.  Apply the CHG Soap to your body ONLY FROM THE NECK DOWN.  Do not use on open wounds or open sores.  Avoid contact with your eyes, ears, mouth and genitals (private parts).  Wash genitals (private parts) with your normal soap.  6.  Wash thoroughly, paying special attention to the area where your surgery will be performed.  7.  Thoroughly rinse your body with warm water from the neck down.  8.  DO NOT shower/wash with your normal soap after using and rinsing off the CHG Soap.  9.  Pat yourself dry with a clean towel.            10.  Wear clean pajamas.            11.  Place clean sheets on your bed  the night of your first shower and do not sleep with pets.  Day of Surgery  Do not apply any lotions/deoderants the morning of surgery.  Please wear clean clothes to the hospital/surgery center.

## 2014-02-20 ENCOUNTER — Encounter (HOSPITAL_COMMUNITY)
Admission: RE | Admit: 2014-02-20 | Discharge: 2014-02-20 | Disposition: A | Discharge status: Home / Self Care | Payer: 59 | Source: Ambulatory Visit | Attending: Orthopedic Surgery | Admitting: Orthopedic Surgery

## 2014-02-20 ENCOUNTER — Other Ambulatory Visit: Payer: Self-pay | Admitting: Internal Medicine

## 2014-02-20 ENCOUNTER — Encounter (HOSPITAL_COMMUNITY): Payer: Self-pay

## 2014-02-20 DIAGNOSIS — Z01818 Encounter for other preprocedural examination: Secondary | ICD-10-CM | POA: Insufficient documentation

## 2014-02-20 DIAGNOSIS — M1710 Unilateral primary osteoarthritis, unspecified knee: Secondary | ICD-10-CM | POA: Diagnosis not present

## 2014-02-20 LAB — BASIC METABOLIC PANEL WITH GFR
Anion gap: 15 (ref 5–15)
BUN: 16 mg/dL (ref 6–23)
CO2: 23 meq/L (ref 19–32)
Calcium: 9.1 mg/dL (ref 8.4–10.5)
Chloride: 103 meq/L (ref 96–112)
Creatinine, Ser: 0.82 mg/dL (ref 0.50–1.35)
GFR calc Af Amer: >90 mL/min
GFR calc non Af Amer: >90 mL/min
Glucose, Bld: 115 mg/dL — ABNORMAL HIGH (ref 70–99)
Potassium: 4 meq/L (ref 3.7–5.3)
Sodium: 141 meq/L (ref 137–147)

## 2014-02-20 LAB — PROTIME-INR
INR: 0.96 (ref 0.00–1.49)
Prothrombin Time: 12.8 s (ref 11.6–15.2)

## 2014-02-20 LAB — URINE MICROSCOPIC-ADD ON

## 2014-02-20 LAB — TYPE AND SCREEN
ABO/RH(D): O NEG
Antibody Screen: NEGATIVE

## 2014-02-20 LAB — CBC WITH DIFFERENTIAL/PLATELET
Basophils Absolute: 0 10*3/uL (ref 0.0–0.1)
Basophils Relative: 1 % (ref 0–1)
Eosinophils Absolute: 0.3 10*3/uL (ref 0.0–0.7)
Eosinophils Relative: 5 % (ref 0–5)
HCT: 41 % (ref 39.0–52.0)
Hemoglobin: 13.7 g/dL (ref 13.0–17.0)
Lymphocytes Relative: 50 % — ABNORMAL HIGH (ref 12–46)
Lymphs Abs: 2.7 10*3/uL (ref 0.7–4.0)
MCH: 32.9 pg (ref 26.0–34.0)
MCHC: 33.4 g/dL (ref 30.0–36.0)
MCV: 98.6 fL (ref 78.0–100.0)
Monocytes Absolute: 0.5 10*3/uL (ref 0.1–1.0)
Monocytes Relative: 9 % (ref 3–12)
Neutro Abs: 1.8 10*3/uL (ref 1.7–7.7)
Neutrophils Relative %: 35 % — ABNORMAL LOW (ref 43–77)
Platelets: 214 10*3/uL (ref 150–400)
RBC: 4.16 MIL/uL — ABNORMAL LOW (ref 4.22–5.81)
RDW: 14.4 % (ref 11.5–15.5)
WBC: 5.3 10*3/uL (ref 4.0–10.5)

## 2014-02-20 LAB — URINALYSIS, ROUTINE W REFLEX MICROSCOPIC
Bilirubin Urine: NEGATIVE
Glucose, UA: NEGATIVE mg/dL
Ketones, ur: 15 mg/dL — AB
Leukocytes, UA: NEGATIVE
Nitrite: NEGATIVE
Protein, ur: NEGATIVE mg/dL
Specific Gravity, Urine: 1.021 (ref 1.005–1.030)
Urobilinogen, UA: 1 mg/dL (ref 0.0–1.0)
pH: 6 (ref 5.0–8.0)

## 2014-02-20 LAB — SURGICAL PCR SCREEN
MRSA, PCR: NEGATIVE
Staphylococcus aureus: NEGATIVE

## 2014-02-20 LAB — APTT: aPTT: 30 s (ref 24–37)

## 2014-02-20 NOTE — Progress Notes (Signed)
Primary - paz No cardiologist ekg and chest xray in epic

## 2014-02-21 ENCOUNTER — Ambulatory Visit: Payer: 59 | Attending: Orthopedic Surgery | Admitting: Physical Therapy

## 2014-02-21 DIAGNOSIS — M25669 Stiffness of unspecified knee, not elsewhere classified: Secondary | ICD-10-CM | POA: Insufficient documentation

## 2014-02-21 DIAGNOSIS — R609 Edema, unspecified: Secondary | ICD-10-CM | POA: Insufficient documentation

## 2014-02-21 DIAGNOSIS — M25569 Pain in unspecified knee: Secondary | ICD-10-CM | POA: Insufficient documentation

## 2014-02-21 DIAGNOSIS — R262 Difficulty in walking, not elsewhere classified: Secondary | ICD-10-CM | POA: Diagnosis not present

## 2014-02-23 ENCOUNTER — Ambulatory Visit: Payer: 59 | Admitting: Physical Therapy

## 2014-02-28 MED ORDER — DEXTROSE 5 % IV SOLN
3.0000 g | INTRAVENOUS | Status: AC
  Administered 2014-03-01: 3 g via INTRAVENOUS
  Filled 2014-02-28: qty 3000

## 2014-02-28 MED ORDER — CHLORHEXIDINE GLUCONATE 4 % EX LIQD
60.0000 mL | Freq: Once | CUTANEOUS | Status: DC
  Filled 2014-02-28: qty 60

## 2014-02-28 NOTE — H&P (Signed)
TOTAL KNEE ADMISSION H&P  Patient is being admitted for left total knee arthroplasty.  Subjective:  Chief Complaint:left knee pain.  HPI: Robert Gibbs, 63 y.o. male, has a history of pain and functional disability in the left knee due to arthritis and has failed non-surgical conservative treatments for greater than 12 weeks to includeNSAID's and/or analgesics, corticosteriod injections, flexibility and strengthening excercises, supervised PT with diminished ADL's post treatment, use of assistive devices, weight reduction as appropriate and activity modification.  Onset of symptoms was gradual, starting several years ago with gradually worsening course since that time. The patient noted prior procedures on the knee to include  arthroscopy on the left knee(s).  Patient currently rates pain in the left knee(s) at 10 out of 10 with activity. Patient has night pain, worsening of pain with activity and weight bearing, pain that interferes with activities of daily living, pain with passive range of motion and crepitus.  Patient has evidence of joint space narrowing by imaging studies.  There is no active infection.  Patient Active Problem List   Diagnosis Date Noted  . Depression 11/20/2013  . Neuropathy 11/20/2013  . Unspecified constipation 11/11/2013  . Arthritis of right knee 11/07/2013  . Arthritis of knee, right 11/06/2013  . Leg edema 11/04/2013  . Knee pain, bilateral 02/23/2013  . Annual physical exam 11/16/2012  . Spinal stenosis of lumbar region 06/08/2012  . Varicose veins  06/08/2012  . Hyperlipidemia 06/08/2012  . Erectile dysfunction 06/08/2012  . HTN (hypertension) 03/18/2012  . OSA (obstructive sleep apnea) 03/18/2012  . GERD (gastroesophageal reflux disease) 03/18/2012  . Hyperglycemia 03/18/2012  . Anxiety and depression 03/18/2012  . Vitamin D deficiency 03/18/2012   Past Medical History  Diagnosis Date  . GERD (gastroesophageal reflux disease) 03/18/2012  . HTN  (hypertension)   . Anxiety and depression   . Vitamin D deficiency   . Spinal stenosis 06-2012    saw neuro w/ "neuropathy", dx w/ spinal stenosis  . Hyperlipidemia   . Erectile dysfunction   . Varicose veins   . CTS (carpal tunnel syndrome)     h/o  . Barrett esophagus   . Wears glasses   . OSA (obstructive sleep apnea) 03/18/2012    on BiPap  . H/O hiatal hernia   . Headache(784.0)     sinus  . Arthritis   . BPH (benign prostatic hyperplasia)   . Edema of both legs     legs , ankles and feet  . Anxiety   . Depression     Past Surgical History  Procedure Laterality Date  . Elbow surgery  1992    for tennis elbow-rt  . Foot surgery      R, for plantar fasciitis  . Prostate biopsy      2010, (-)  . Back surgery  2002    L4-L5  . Hernia repair      x 2 as a child-ing   . Uvulopalatopharyngoplasty (uppp)/tonsillectomy/septoplasty  1990  . Colonoscopy    . Upper gastrointestinal endoscopy    . Arch bar removal    . Ligation / division saphenous vein      both legs  . Knee arthroscopy with meniscal repair Bilateral 05/04/2013    Procedure: BILATERAL KNEE ARTHROSCOPY WITH POSSIBLE CHONDROPLASTY AND MENISCECTOMY ;  Surgeon: Kerin Salen, MD;  Location: [place];  Service: Orthopedics;  Laterality: Bilateral;  Right chrondroplasty, debridement, , partial medial menisectomy, removal of loose bodies.   . Tonsillectomy    .  Eye surgery Right     cataract with lens implant  . 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 Total knee arthroplasty Right 11/07/2013    Procedure: TOTAL KNEE ARTHROPLASTY;  Surgeon: Kerin Salen, MD;  Location: [place];  Service: Orthopedics;  Laterality: Right;    No prescriptions prior to admission   Allergies  Allergen Reactions  . Codeine Itching  . Nsaids     Acid Reflux  . Simvastatin     Mimics heart attack    History  Substance Use Topics  . Smoking status: Former Smoker -- 1.00 packs/day for 40 years     Quit date: 06/29/2008  . Smokeless tobacco: Never Used     Comment: quit 2010  . Alcohol Use: 1.5 oz/week    3 drink(s) per week     Comment: 2-3 large drinks, Vodka, 2-3 times a week    Family History  Problem Relation Age of Onset  . Colon cancer Neg Hx   . Prostate cancer Neg Hx   . Diabetes Neg Hx   . Cancer Neg Hx   . CAD Father     ?     Review of Systems  Constitutional: Negative.   HENT: Negative.   Eyes:       Glasses  Respiratory: Negative.   Cardiovascular: Negative.   Gastrointestinal: Negative.   Genitourinary: Positive for hematuria.  Musculoskeletal: Positive for joint pain.  Skin: Negative.   Neurological: Negative.   Endo/Heme/Allergies: Negative.   Psychiatric/Behavioral: Positive for depression. The patient has insomnia.     Objective:  Physical Exam  Constitutional: He is oriented to person, place, and time. He appears well-developed and well-nourished.  HENT:  Head: Normocephalic and atraumatic.  Eyes: Pupils are equal, round, and reactive to light.  Neck: Normal range of motion. Neck supple.  Cardiovascular: Intact distal pulses.   Respiratory: Effort normal.  Musculoskeletal: He exhibits tenderness.  Surgical wound is well-healed, range of motion is 0/105.  Collateral ligaments are stable there is no effusion.  Good power to testing of the quadriceps and hamstring.  Neurological: He is alert and oriented to person, place, and time.  Skin: Skin is warm and dry.  Psychiatric: He has a normal mood and affect. His behavior is normal. Judgment and thought content normal.    Vital signs in last 24 hours:    Labs:   Estimated body mass index is 39.77 kg/(m^2) as calculated from the following:   Height as of 11/04/13: 5\' 10"  (1.778 m).   Weight as of 11/18/13: 125.737 kg (277 lb 3.2 oz).   Imaging Review Plain radiographs demonstrate bilateral medial compartment knee arthritis, moderate by x-ray and grade 3 by arthroscopy in November of  2014.  Assessment/Plan:  End stage arthritis, left knee   The patient history, physical examination, clinical judgment of the provider and imaging studies are consistent with end stage degenerative joint disease of the left knee(s) and total knee arthroplasty is deemed medically necessary. The treatment options including medical management, injection therapy arthroscopy and arthroplasty were discussed at length. The risks and benefits of total knee arthroplasty were presented and reviewed. The risks due to aseptic loosening, infection, stiffness, patella tracking problems, thromboembolic complications and other imponderables were discussed. The patient acknowledged the explanation, agreed to proceed with the plan and consent was signed. Patient is being admitted for inpatient treatment for surgery, pain control, PT, OT, prophylactic antibiotics, VTE prophylaxis, progressive ambulation and ADL's and discharge planning.  The patient is planning to be discharged home with home health services

## 2014-03-01 ENCOUNTER — Inpatient Hospital Stay (HOSPITAL_COMMUNITY): Payer: 59 | Admitting: Anesthesiology

## 2014-03-01 ENCOUNTER — Encounter (HOSPITAL_COMMUNITY)
Admission: RE | Disposition: A | Discharge status: Home Health | Payer: Self-pay | Source: Ambulatory Visit | Attending: Orthopedic Surgery

## 2014-03-01 ENCOUNTER — Encounter (HOSPITAL_COMMUNITY): Payer: Self-pay | Admitting: Anesthesiology

## 2014-03-01 ENCOUNTER — Inpatient Hospital Stay (HOSPITAL_COMMUNITY)
Admission: RE | Admit: 2014-03-01 | Discharge: 2014-03-03 | DRG: 470 | Disposition: A | Discharge status: Home Health | Payer: 59 | Source: Ambulatory Visit | Attending: Orthopedic Surgery | Admitting: Orthopedic Surgery

## 2014-03-01 ENCOUNTER — Encounter (HOSPITAL_COMMUNITY): Payer: 59 | Admitting: Anesthesiology

## 2014-03-01 DIAGNOSIS — Z6841 Body Mass Index (BMI) 40.0 and over, adult: Secondary | ICD-10-CM | POA: Diagnosis not present

## 2014-03-01 DIAGNOSIS — M1710 Unilateral primary osteoarthritis, unspecified knee: Principal | ICD-10-CM | POA: Diagnosis present

## 2014-03-01 DIAGNOSIS — D62 Acute posthemorrhagic anemia: Secondary | ICD-10-CM | POA: Diagnosis not present

## 2014-03-01 DIAGNOSIS — Z888 Allergy status to other drugs, medicaments and biological substances status: Secondary | ICD-10-CM

## 2014-03-01 DIAGNOSIS — Z8249 Family history of ischemic heart disease and other diseases of the circulatory system: Secondary | ICD-10-CM | POA: Diagnosis not present

## 2014-03-01 DIAGNOSIS — Z7982 Long term (current) use of aspirin: Secondary | ICD-10-CM | POA: Diagnosis not present

## 2014-03-01 DIAGNOSIS — E785 Hyperlipidemia, unspecified: Secondary | ICD-10-CM | POA: Diagnosis present

## 2014-03-01 DIAGNOSIS — Z79899 Other long term (current) drug therapy: Secondary | ICD-10-CM

## 2014-03-01 DIAGNOSIS — G4733 Obstructive sleep apnea (adult) (pediatric): Secondary | ICD-10-CM | POA: Diagnosis present

## 2014-03-01 DIAGNOSIS — E559 Vitamin D deficiency, unspecified: Secondary | ICD-10-CM | POA: Diagnosis present

## 2014-03-01 DIAGNOSIS — F411 Generalized anxiety disorder: Secondary | ICD-10-CM | POA: Diagnosis present

## 2014-03-01 DIAGNOSIS — N529 Male erectile dysfunction, unspecified: Secondary | ICD-10-CM | POA: Diagnosis present

## 2014-03-01 DIAGNOSIS — K219 Gastro-esophageal reflux disease without esophagitis: Secondary | ICD-10-CM | POA: Diagnosis present

## 2014-03-01 DIAGNOSIS — N400 Enlarged prostate without lower urinary tract symptoms: Secondary | ICD-10-CM | POA: Diagnosis present

## 2014-03-01 DIAGNOSIS — F3289 Other specified depressive episodes: Secondary | ICD-10-CM | POA: Diagnosis present

## 2014-03-01 DIAGNOSIS — Z96659 Presence of unspecified artificial knee joint: Secondary | ICD-10-CM

## 2014-03-01 DIAGNOSIS — Z87891 Personal history of nicotine dependence: Secondary | ICD-10-CM

## 2014-03-01 DIAGNOSIS — I10 Essential (primary) hypertension: Secondary | ICD-10-CM | POA: Diagnosis present

## 2014-03-01 DIAGNOSIS — Z886 Allergy status to analgesic agent status: Secondary | ICD-10-CM | POA: Diagnosis not present

## 2014-03-01 DIAGNOSIS — F329 Major depressive disorder, single episode, unspecified: Secondary | ICD-10-CM | POA: Diagnosis present

## 2014-03-01 DIAGNOSIS — M25569 Pain in unspecified knee: Secondary | ICD-10-CM | POA: Diagnosis present

## 2014-03-01 DIAGNOSIS — E669 Obesity, unspecified: Secondary | ICD-10-CM | POA: Diagnosis present

## 2014-03-01 DIAGNOSIS — M1712 Unilateral primary osteoarthritis, left knee: Secondary | ICD-10-CM

## 2014-03-01 HISTORY — PX: TOTAL KNEE ARTHROPLASTY: SHX125

## 2014-03-01 LAB — GLUCOSE, CAPILLARY: Glucose-Capillary: 152 mg/dL — ABNORMAL HIGH (ref 70–99)

## 2014-03-01 SURGERY — ARTHROPLASTY, KNEE, TOTAL
Anesthesia: Regional | Laterality: Left

## 2014-03-01 MED ORDER — PROMETHAZINE HCL 25 MG/ML IJ SOLN: 6.2500 mg | INTRAMUSCULAR | Status: DC | PRN

## 2014-03-01 MED ORDER — MELATONIN 5 MG PO CAPS
10.0000 mg | ORAL_CAPSULE | Freq: Every day | ORAL | Status: DC
  Filled 2014-03-01: qty 2

## 2014-03-01 MED ORDER — NALOXONE HCL 0.4 MG/ML IJ SOLN
INTRAMUSCULAR | Status: AC
  Administered 2014-03-01: 0.4 mg
  Filled 2014-03-01: qty 1

## 2014-03-01 MED ORDER — GLYCOPYRROLATE 0.2 MG/ML IJ SOLN
INTRAMUSCULAR | Status: AC
  Filled 2014-03-01: qty 2

## 2014-03-01 MED ORDER — EPHEDRINE SULFATE 50 MG/ML IJ SOLN
INTRAMUSCULAR | Status: DC | PRN
  Administered 2014-03-01: 15 mg via INTRAVENOUS
  Administered 2014-03-01: 10 mg via INTRAVENOUS

## 2014-03-01 MED ORDER — SUCCINYLCHOLINE CHLORIDE 20 MG/ML IJ SOLN
INTRAMUSCULAR | Status: DC | PRN
  Administered 2014-03-01: 120 mg via INTRAVENOUS

## 2014-03-01 MED ORDER — METHOCARBAMOL 500 MG PO TABS: 500.0000 mg | ORAL_TABLET | Freq: Two times a day (BID) | ORAL | Status: DC

## 2014-03-01 MED ORDER — EPHEDRINE SULFATE 50 MG/ML IJ SOLN
INTRAMUSCULAR | Status: AC
  Filled 2014-03-01: qty 1

## 2014-03-01 MED ORDER — SODIUM CHLORIDE 0.9 % IJ SOLN
INTRAMUSCULAR | Status: DC | PRN
  Administered 2014-03-01: 40 mL via INTRAVENOUS

## 2014-03-01 MED ORDER — HYDROMORPHONE HCL PF 1 MG/ML IJ SOLN
0.5000 mg | INTRAMUSCULAR | Status: DC | PRN
  Administered 2014-03-02 – 2014-03-03 (×4): 0.5 mg via INTRAVENOUS
  Filled 2014-03-01 (×4): qty 1

## 2014-03-01 MED ORDER — CEFUROXIME SODIUM 1.5 G IJ SOLR
INTRAMUSCULAR | Status: DC | PRN
  Administered 2014-03-01: 1.5 g

## 2014-03-01 MED ORDER — HYDROMORPHONE HCL PF 1 MG/ML IJ SOLN
0.2500 mg | INTRAMUSCULAR | Status: DC | PRN
  Administered 2014-03-01 (×6): 0.5 mg via INTRAVENOUS

## 2014-03-01 MED ORDER — PROPOFOL 10 MG/ML IV BOLUS
INTRAVENOUS | Status: DC | PRN
  Administered 2014-03-01: 200 mg via INTRAVENOUS

## 2014-03-01 MED ORDER — METOCLOPRAMIDE HCL 5 MG/ML IJ SOLN: 5.0000 mg | Freq: Three times a day (TID) | INTRAMUSCULAR | Status: DC | PRN

## 2014-03-01 MED ORDER — MENTHOL 3 MG MT LOZG: 1.0000 | LOZENGE | OROMUCOSAL | Status: DC | PRN

## 2014-03-01 MED ORDER — PROPOFOL 10 MG/ML IV BOLUS
INTRAVENOUS | Status: AC
  Filled 2014-03-01: qty 20

## 2014-03-01 MED ORDER — ONDANSETRON HCL 4 MG/2ML IJ SOLN: 4.0000 mg | Freq: Four times a day (QID) | INTRAMUSCULAR | Status: DC | PRN

## 2014-03-01 MED ORDER — ASPIRIN EC 325 MG PO TBEC: 325.0000 mg | DELAYED_RELEASE_TABLET | Freq: Two times a day (BID) | ORAL | Status: DC

## 2014-03-01 MED ORDER — LACTATED RINGERS IV SOLN
INTRAVENOUS | Status: DC
  Administered 2014-03-01: 13:00:00 via INTRAVENOUS

## 2014-03-01 MED ORDER — GABAPENTIN 400 MG PO CAPS
800.0000 mg | ORAL_CAPSULE | Freq: Three times a day (TID) | ORAL | Status: DC
  Administered 2014-03-01 – 2014-03-03 (×6): 800 mg via ORAL
  Filled 2014-03-01 (×10): qty 2

## 2014-03-01 MED ORDER — DIPHENHYDRAMINE HCL 12.5 MG/5ML PO ELIX: 12.5000 mg | ORAL_SOLUTION | ORAL | Status: DC | PRN

## 2014-03-01 MED ORDER — TRANEXAMIC ACID 100 MG/ML IV SOLN
1000.0000 mg | INTRAVENOUS | Status: AC
  Administered 2014-03-01: 1000 mg via INTRAVENOUS
  Filled 2014-03-01: qty 10

## 2014-03-01 MED ORDER — METHOCARBAMOL 500 MG PO TABS
500.0000 mg | ORAL_TABLET | Freq: Four times a day (QID) | ORAL | Status: DC | PRN
  Administered 2014-03-03: 500 mg via ORAL
  Filled 2014-03-01: qty 1

## 2014-03-01 MED ORDER — LACTATED RINGERS IV SOLN
INTRAVENOUS | Status: DC | PRN
  Administered 2014-03-01 (×2): via INTRAVENOUS

## 2014-03-01 MED ORDER — HYDROMORPHONE HCL PF 1 MG/ML IJ SOLN
INTRAMUSCULAR | Status: AC
  Filled 2014-03-01: qty 1

## 2014-03-01 MED ORDER — NEOSTIGMINE METHYLSULFATE 10 MG/10ML IV SOLN
INTRAVENOUS | Status: DC | PRN
  Administered 2014-03-01: 3 mg via INTRAVENOUS

## 2014-03-01 MED ORDER — HYDROMORPHONE HCL PF 1 MG/ML IJ SOLN
INTRAMUSCULAR | Status: AC
  Filled 2014-03-01: qty 2

## 2014-03-01 MED ORDER — FENTANYL CITRATE 0.05 MG/ML IJ SOLN
INTRAMUSCULAR | Status: DC | PRN
  Administered 2014-03-01 (×3): 50 ug via INTRAVENOUS
  Administered 2014-03-01: 100 ug via INTRAVENOUS

## 2014-03-01 MED ORDER — LIDOCAINE HCL (CARDIAC) 20 MG/ML IV SOLN
INTRAVENOUS | Status: AC
  Filled 2014-03-01: qty 5

## 2014-03-01 MED ORDER — DEXTROSE-NACL 5-0.45 % IV SOLN: INTRAVENOUS | Status: DC

## 2014-03-01 MED ORDER — ESCITALOPRAM OXALATE 10 MG PO TABS
10.0000 mg | ORAL_TABLET | Freq: Every day | ORAL | Status: DC
  Administered 2014-03-02 – 2014-03-03 (×2): 10 mg via ORAL
  Filled 2014-03-01 (×2): qty 1

## 2014-03-01 MED ORDER — ALUM & MAG HYDROXIDE-SIMETH 200-200-20 MG/5ML PO SUSP: 30.0000 mL | ORAL | Status: DC | PRN

## 2014-03-01 MED ORDER — HYDROMORPHONE HCL PF 1 MG/ML IJ SOLN: 1.0000 mg | INTRAMUSCULAR | Status: DC | PRN

## 2014-03-01 MED ORDER — MIDAZOLAM HCL 5 MG/5ML IJ SOLN
INTRAMUSCULAR | Status: DC | PRN
  Administered 2014-03-01 (×2): 1 mg via INTRAVENOUS

## 2014-03-01 MED ORDER — BISACODYL 5 MG PO TBEC: 5.0000 mg | DELAYED_RELEASE_TABLET | Freq: Every day | ORAL | Status: DC | PRN

## 2014-03-01 MED ORDER — SUCCINYLCHOLINE CHLORIDE 20 MG/ML IJ SOLN
INTRAMUSCULAR | Status: AC
  Filled 2014-03-01: qty 1

## 2014-03-01 MED ORDER — LISINOPRIL 40 MG PO TABS
40.0000 mg | ORAL_TABLET | Freq: Every day | ORAL | Status: DC
  Administered 2014-03-02 – 2014-03-03 (×2): 40 mg via ORAL
  Filled 2014-03-01 (×2): qty 1

## 2014-03-01 MED ORDER — SENNOSIDES-DOCUSATE SODIUM 8.6-50 MG PO TABS: 1.0000 | ORAL_TABLET | Freq: Every evening | ORAL | Status: DC | PRN

## 2014-03-01 MED ORDER — MIDAZOLAM HCL 2 MG/2ML IJ SOLN
INTRAMUSCULAR | Status: AC
  Filled 2014-03-01: qty 2

## 2014-03-01 MED ORDER — CEFUROXIME SODIUM 1.5 G IJ SOLR
INTRAMUSCULAR | Status: AC
  Filled 2014-03-01: qty 1.5

## 2014-03-01 MED ORDER — DOCUSATE SODIUM 100 MG PO CAPS
100.0000 mg | ORAL_CAPSULE | Freq: Two times a day (BID) | ORAL | Status: DC
  Administered 2014-03-01 – 2014-03-03 (×4): 100 mg via ORAL
  Filled 2014-03-01 (×5): qty 1

## 2014-03-01 MED ORDER — METHOCARBAMOL 500 MG PO TABS
ORAL_TABLET | ORAL | Status: AC
  Administered 2014-03-01: 500 mg
  Filled 2014-03-01: qty 1

## 2014-03-01 MED ORDER — HYDROMORPHONE HCL 2 MG PO TABS: 2.0000 mg | ORAL_TABLET | Freq: Four times a day (QID) | ORAL | Status: DC | PRN

## 2014-03-01 MED ORDER — ACETAMINOPHEN 650 MG RE SUPP: 650.0000 mg | Freq: Four times a day (QID) | RECTAL | Status: DC | PRN

## 2014-03-01 MED ORDER — ROCURONIUM BROMIDE 50 MG/5ML IV SOLN
INTRAVENOUS | Status: AC
  Filled 2014-03-01: qty 1

## 2014-03-01 MED ORDER — ARTIFICIAL TEARS OP OINT
TOPICAL_OINTMENT | OPHTHALMIC | Status: AC
  Filled 2014-03-01: qty 3.5

## 2014-03-01 MED ORDER — NEOSTIGMINE METHYLSULFATE 10 MG/10ML IV SOLN
INTRAVENOUS | Status: AC
  Filled 2014-03-01: qty 1

## 2014-03-01 MED ORDER — ASPIRIN EC 325 MG PO TBEC
325.0000 mg | DELAYED_RELEASE_TABLET | Freq: Every day | ORAL | Status: DC
  Administered 2014-03-02 – 2014-03-03 (×2): 325 mg via ORAL
  Filled 2014-03-01 (×3): qty 1

## 2014-03-01 MED ORDER — ONDANSETRON HCL 4 MG PO TABS: 4.0000 mg | ORAL_TABLET | Freq: Four times a day (QID) | ORAL | Status: DC | PRN

## 2014-03-01 MED ORDER — METOPROLOL SUCCINATE ER 100 MG PO TB24
100.0000 mg | ORAL_TABLET | Freq: Every day | ORAL | Status: DC
  Administered 2014-03-01 – 2014-03-03 (×3): 100 mg via ORAL
  Filled 2014-03-01 (×4): qty 1

## 2014-03-01 MED ORDER — 0.9 % SODIUM CHLORIDE (POUR BTL) OPTIME
TOPICAL | Status: DC | PRN
  Administered 2014-03-01: 1000 mL

## 2014-03-01 MED ORDER — GLYCOPYRROLATE 0.2 MG/ML IJ SOLN
INTRAMUSCULAR | Status: DC | PRN
  Administered 2014-03-01: 0.4 mg via INTRAVENOUS

## 2014-03-01 MED ORDER — ARTIFICIAL TEARS OP OINT
TOPICAL_OINTMENT | OPHTHALMIC | Status: DC | PRN
  Administered 2014-03-01: 1 via OPHTHALMIC

## 2014-03-01 MED ORDER — KCL IN DEXTROSE-NACL 20-5-0.45 MEQ/L-%-% IV SOLN
INTRAVENOUS | Status: AC
Start: 2014-03-01 — End: 2014-03-02
  Filled 2014-03-01: qty 1000

## 2014-03-01 MED ORDER — GABAPENTIN 800 MG PO TABS
800.0000 mg | ORAL_TABLET | Freq: Four times a day (QID) | ORAL | Status: DC
  Filled 2014-03-01 (×2): qty 1

## 2014-03-01 MED ORDER — BUPIVACAINE LIPOSOME 1.3 % IJ SUSP
INTRAMUSCULAR | Status: DC | PRN
  Administered 2014-03-01: 20 mL

## 2014-03-01 MED ORDER — PHENOL 1.4 % MT LIQD
1.0000 | OROMUCOSAL | Status: DC | PRN
Start: 2014-03-01 — End: 2014-03-03

## 2014-03-01 MED ORDER — ZOLPIDEM TARTRATE 5 MG PO TABS: 5.0000 mg | ORAL_TABLET | Freq: Every evening | ORAL | Status: DC | PRN

## 2014-03-01 MED ORDER — ALPRAZOLAM 0.5 MG PO TABS
0.5000 mg | ORAL_TABLET | Freq: Every day | ORAL | Status: DC
  Administered 2014-03-01 – 2014-03-02 (×2): 0.5 mg via ORAL
  Filled 2014-03-01 (×2): qty 1

## 2014-03-01 MED ORDER — OXYCODONE HCL 5 MG PO TABS
5.0000 mg | ORAL_TABLET | ORAL | Status: DC | PRN
  Administered 2014-03-02 – 2014-03-03 (×8): 10 mg via ORAL
  Filled 2014-03-01 (×7): qty 2

## 2014-03-01 MED ORDER — ONDANSETRON HCL 4 MG/2ML IJ SOLN
INTRAMUSCULAR | Status: DC | PRN
  Administered 2014-03-01: 4 mg via INTRAVENOUS

## 2014-03-01 MED ORDER — ONDANSETRON HCL 4 MG/2ML IJ SOLN
INTRAMUSCULAR | Status: AC
  Filled 2014-03-01: qty 2

## 2014-03-01 MED ORDER — FENTANYL CITRATE 0.05 MG/ML IJ SOLN
INTRAMUSCULAR | Status: AC
  Filled 2014-03-01: qty 5

## 2014-03-01 MED ORDER — METHOCARBAMOL 1000 MG/10ML IJ SOLN
500.0000 mg | Freq: Four times a day (QID) | INTRAVENOUS | Status: DC | PRN
  Filled 2014-03-01: qty 5

## 2014-03-01 MED ORDER — FUROSEMIDE 20 MG PO TABS
20.0000 mg | ORAL_TABLET | Freq: Every day | ORAL | Status: DC | PRN
  Filled 2014-03-01: qty 1

## 2014-03-01 MED ORDER — ACETAMINOPHEN 325 MG PO TABS
650.0000 mg | ORAL_TABLET | Freq: Four times a day (QID) | ORAL | Status: DC | PRN
  Administered 2014-03-01 – 2014-03-02 (×2): 650 mg via ORAL
  Filled 2014-03-01 (×2): qty 2

## 2014-03-01 MED ORDER — MAGNESIUM CITRATE PO SOLN: 1.0000 | Freq: Once | ORAL | Status: AC | PRN

## 2014-03-01 MED ORDER — KCL IN DEXTROSE-NACL 20-5-0.45 MEQ/L-%-% IV SOLN
INTRAVENOUS | Status: DC
  Administered 2014-03-01 – 2014-03-02 (×3): via INTRAVENOUS
  Filled 2014-03-01 (×8): qty 1000

## 2014-03-01 MED ORDER — ROCURONIUM BROMIDE 100 MG/10ML IV SOLN
INTRAVENOUS | Status: DC | PRN
  Administered 2014-03-01: 40 mg via INTRAVENOUS

## 2014-03-01 MED ORDER — SODIUM CHLORIDE 0.9 % IJ SOLN
INTRAMUSCULAR | Status: AC
  Filled 2014-03-01: qty 10

## 2014-03-01 MED ORDER — BUPIVACAINE LIPOSOME 1.3 % IJ SUSP
20.0000 mL | Freq: Once | INTRAMUSCULAR | Status: DC
  Filled 2014-03-01: qty 20

## 2014-03-01 MED ORDER — PANTOPRAZOLE SODIUM 40 MG PO TBEC
80.0000 mg | DELAYED_RELEASE_TABLET | Freq: Every day | ORAL | Status: DC
  Administered 2014-03-02 – 2014-03-03 (×2): 80 mg via ORAL
  Filled 2014-03-01 (×3): qty 2

## 2014-03-01 MED ORDER — BUPROPION HCL ER (XL) 300 MG PO TB24
300.0000 mg | ORAL_TABLET | Freq: Every day | ORAL | Status: DC
  Administered 2014-03-02 – 2014-03-03 (×2): 300 mg via ORAL
  Filled 2014-03-01 (×3): qty 1

## 2014-03-01 MED ORDER — METOCLOPRAMIDE HCL 10 MG PO TABS: 5.0000 mg | ORAL_TABLET | Freq: Three times a day (TID) | ORAL | Status: DC | PRN

## 2014-03-01 MED ORDER — TRIAMCINOLONE ACETONIDE 55 MCG/ACT NA AERO
2.0000 | INHALATION_SPRAY | Freq: Every day | NASAL | Status: DC
  Filled 2014-03-01: qty 21.6

## 2014-03-01 MED ORDER — SODIUM CHLORIDE 0.9 % IR SOLN
Status: DC | PRN
  Administered 2014-03-01: 1000 mL

## 2014-03-01 SURGICAL SUPPLY — 64 items
BANDAGE ELASTIC 6 VELCRO ST LF (GAUZE/BANDAGES/DRESSINGS) ×3 IMPLANT
BANDAGE ESMARK 6X9 LF (GAUZE/BANDAGES/DRESSINGS) ×1 IMPLANT
BLADE SAG 18X100X1.27 (BLADE) ×3 IMPLANT
BLADE SAW SGTL 13X75X1.27 (BLADE) ×3 IMPLANT
BLADE SURG ROTATE 9660 (MISCELLANEOUS) IMPLANT
BNDG ELASTIC 6X10 VLCR STRL LF (GAUZE/BANDAGES/DRESSINGS) ×3 IMPLANT
BNDG ESMARK 6X9 LF (GAUZE/BANDAGES/DRESSINGS) ×3
BOWL SMART MIX CTS (DISPOSABLE) ×3 IMPLANT
CAPT RP KNEE ×3 IMPLANT
CEMENT HV SMART SET (Cement) ×6 IMPLANT
COVER SURGICAL LIGHT HANDLE (MISCELLANEOUS) ×3 IMPLANT
CUFF TOURNIQUET SINGLE 34IN LL (TOURNIQUET CUFF) ×3 IMPLANT
CUFF TOURNIQUET SINGLE 44IN (TOURNIQUET CUFF) IMPLANT
DRAPE EXTREMITY T 121X128X90 (DRAPE) ×3 IMPLANT
DRAPE U-SHAPE 47X51 STRL (DRAPES) ×3 IMPLANT
DRSG EMULSION OIL 3X3 NADH (GAUZE/BANDAGES/DRESSINGS) ×3 IMPLANT
DURAPREP 26ML APPLICATOR (WOUND CARE) ×6 IMPLANT
ELECT REM PT RETURN 9FT ADLT (ELECTROSURGICAL) ×3
ELECTRODE REM PT RTRN 9FT ADLT (ELECTROSURGICAL) ×1 IMPLANT
EVACUATOR 1/8 PVC DRAIN (DRAIN) ×3 IMPLANT
GAUZE SPONGE 4X4 12PLY STRL (GAUZE/BANDAGES/DRESSINGS) ×6 IMPLANT
GAUZE XEROFORM 1X8 LF (GAUZE/BANDAGES/DRESSINGS) ×3 IMPLANT
GLOVE BIO SURGEON STRL SZ 6.5 (GLOVE) ×2 IMPLANT
GLOVE BIO SURGEON STRL SZ7.5 (GLOVE) ×3 IMPLANT
GLOVE BIO SURGEON STRL SZ8.5 (GLOVE) ×3 IMPLANT
GLOVE BIO SURGEONS STRL SZ 6.5 (GLOVE) ×1
GLOVE BIOGEL PI IND STRL 6.5 (GLOVE) ×1 IMPLANT
GLOVE BIOGEL PI IND STRL 8 (GLOVE) ×2 IMPLANT
GLOVE BIOGEL PI IND STRL 9 (GLOVE) ×1 IMPLANT
GLOVE BIOGEL PI INDICATOR 6.5 (GLOVE) ×2
GLOVE BIOGEL PI INDICATOR 8 (GLOVE) ×4
GLOVE BIOGEL PI INDICATOR 9 (GLOVE) ×2
GOWN STRL REUS W/ TWL LRG LVL3 (GOWN DISPOSABLE) ×1 IMPLANT
GOWN STRL REUS W/ TWL XL LVL3 (GOWN DISPOSABLE) ×2 IMPLANT
GOWN STRL REUS W/TWL LRG LVL3 (GOWN DISPOSABLE) ×2
GOWN STRL REUS W/TWL XL LVL3 (GOWN DISPOSABLE) ×4
HANDPIECE INTERPULSE COAX TIP (DISPOSABLE) ×2
HOOD PEEL AWAY FACE SHEILD DIS (HOOD) ×6 IMPLANT
KIT BASIN OR (CUSTOM PROCEDURE TRAY) ×3 IMPLANT
KIT ROOM TURNOVER OR (KITS) ×3 IMPLANT
MANIFOLD NEPTUNE II (INSTRUMENTS) ×3 IMPLANT
NDL SAFETY ECLIPSE 18X1.5 (NEEDLE) IMPLANT
NEEDLE 22X1 1/2 (OR ONLY) (NEEDLE) IMPLANT
NEEDLE HYPO 18GX1.5 SHARP (NEEDLE)
NEEDLE SPNL 18GX3.5 QUINCKE PK (NEEDLE) IMPLANT
NS IRRIG 1000ML POUR BTL (IV SOLUTION) ×3 IMPLANT
PACK TOTAL JOINT (CUSTOM PROCEDURE TRAY) ×3 IMPLANT
PAD ARMBOARD 7.5X6 YLW CONV (MISCELLANEOUS) ×6 IMPLANT
PADDING CAST COTTON 6X4 STRL (CAST SUPPLIES) ×3 IMPLANT
SET HNDPC FAN SPRY TIP SCT (DISPOSABLE) ×1 IMPLANT
SPONGE GAUZE 4X4 12PLY STER LF (GAUZE/BANDAGES/DRESSINGS) ×6 IMPLANT
STAPLER VISISTAT 35W (STAPLE) IMPLANT
SUCTION FRAZIER TIP 10 FR DISP (SUCTIONS) ×3 IMPLANT
SUT VIC AB 0 CTX 36 (SUTURE) ×2
SUT VIC AB 0 CTX36XBRD ANTBCTR (SUTURE) ×1 IMPLANT
SUT VIC AB 1 CTX 36 (SUTURE) ×2
SUT VIC AB 1 CTX36XBRD ANBCTR (SUTURE) ×1 IMPLANT
SUT VIC AB 2-0 CT1 27 (SUTURE) ×2
SUT VIC AB 2-0 CT1 TAPERPNT 27 (SUTURE) ×1 IMPLANT
SYR 30ML LL (SYRINGE) ×3 IMPLANT
SYR 50ML LL SCALE MARK (SYRINGE) ×3 IMPLANT
TOWEL OR 17X24 6PK STRL BLUE (TOWEL DISPOSABLE) ×3 IMPLANT
TOWEL OR 17X26 10 PK STRL BLUE (TOWEL DISPOSABLE) ×3 IMPLANT
WATER STERILE IRR 1000ML POUR (IV SOLUTION) IMPLANT

## 2014-03-01 NOTE — Anesthesia Postprocedure Evaluation (Signed)
  Anesthesia Post-op Note  Patient: Robert Gibbs  Procedure(s) Performed: Procedure(s): TOTAL KNEE ARTHROPLASTY (Left)  Patient Location: PACU  Anesthesia Type:GA combined with regional for post-op pain  Level of Consciousness: awake, alert  and oriented  Airway and Oxygen Therapy: Patient Spontanous Breathing  Post-op Pain: mild  Post-op Assessment: Post-op Vital signs reviewed  Post-op Vital Signs: Reviewed  Last Vitals:  Filed Vitals:   03/01/14 1615  BP:   Pulse: 83  Temp: 36.2 C  Resp: 11    Complications: No apparent anesthesia complications

## 2014-03-01 NOTE — Significant Event (Signed)
Rapid Response Event Note  Overview:  Alerted by RT of patient needing Bipap on 5N Time Called: Nectar Arrival Time: 1826 Event Type: Neurologic;Respiratory  Initial Focused Assessment:  On arrival patient supine in bed - arouses to shaking and loud voice - able to speak but falls back to sleep easily - snoring resps - bil BS clear - with head tilt chin lift snoring subsides - no stridor with that - abd soft skin warm and moist - on NRB mask just placed prior to arrival - left knee DDI - BP 132/78 HR 88 RR 14 O2 sat 94%  - review of chart shows patient had 3 mg Dilaudid  In short time period in the PACU - staff reports patient was awake on arrival to unit but has become increasing sleepy with decreasing O2 sats -    Interventions:  Repositioned - airway seems clear with stimulation and lifting chin -  1832  Narcan 0.4 mg IV given.  Patient became spontaneously alert - resps reg and unlabored - snoring cleared.  Patient and family state patient sleeps with Bipap at nighttime.  Bp 123/65 HR 78 RR 20 O2 sat 100% on RA now.  Bil BS clear.  Minna Merritts RN speaking with ortho team [place] PA with update.  1900 Patient remains alert without resp distress - airway clear.  On nasal cannula 3 liters 100% O2 sat - bil BS with coarse right side - good cough - RN staff to give IS and use every hour this pm.  Will follow as needed.  RT to set up nighttime Bipap for patient - as used at home.   Event Summary: Name of Physician Notified: Dr. Mayer Camel service = Camas PA at  (pta RRT)    at    Outcome: Stayed in room and stabalized  Event End Time: 1905  Quin Hoop

## 2014-03-01 NOTE — Anesthesia Procedure Notes (Addendum)
Anesthesia Regional Block:  Adductor canal block  Pre-Anesthetic Checklist: ,, timeout performed, Correct Patient, Correct Site, Correct Laterality, Correct Procedure, Correct Position, site marked, Risks and benefits discussed,  Surgical consent,  Pre-op evaluation,  At surgeon's request and post-op pain management  Laterality: Left  Prep: chloraprep       Needles:  Injection technique: Single-shot  Needle Type: Echogenic Needle     Needle Length: 9cm 9 cm Needle Gauge: 21 and 21 G    Additional Needles:  Procedures: ultrasound guided (picture in chart) Adductor canal block Narrative:  Start time: 03/01/2014 12:42 PM End time: 03/01/2014 12:52 PM Injection made incrementally with aspirations every 5 mL.  Performed by: Personally  Anesthesiologist: Suzette Battiest, MD   Procedure Name: Intubation Date/Time: 03/01/2014 1:10 PM Performed by: Scheryl Darter Pre-anesthesia Checklist: Patient identified, Emergency Drugs available, Suction available, Patient being monitored and Timeout performed Patient Re-evaluated:Patient Re-evaluated prior to inductionPreoxygenation: Pre-oxygenation with 100% oxygen Intubation Type: IV induction Ventilation: Mask ventilation without difficulty Laryngoscope Size: Mac and 3 (attempted x 1) Grade View: Grade II Tube type: Oral Tube size: 7.5 mm Number of attempts: 2 Airway Equipment and Method: Stylet and Video-laryngoscopy Placement Confirmation: ETT inserted through vocal cords under direct vision,  positive ETCO2 and breath sounds checked- equal and bilateral Secured at: 23 cm Tube secured with: Tape Dental Injury: Teeth and Oropharynx as per pre-operative assessment  Difficulty Due To: Difficult Airway- due to limited oral opening, Difficult Airway- due to reduced neck mobility and Difficulty was anticipated    Performed by: Scheryl Darter

## 2014-03-01 NOTE — Progress Notes (Signed)
Orthopedic Tech Progress Note Patient Details:  Robert Gibbs 21-Feb-1951 [id number] Off cpm at 7:40 pm Patient ID: Robert Gibbs, male   DOB: 20-Jul-1950, 63 y.o.   MRN: [id number]   Robert Gibbs 03/01/2014, 7:50 PM

## 2014-03-01 NOTE — Progress Notes (Signed)
Patient becoming increasingly more sleepy and 02 sats are dropping. Respiratory therapy and Rapid Response RN were notified. Dr. Mayer Camel office was also called and on call PA was notified and is aware.

## 2014-03-01 NOTE — Progress Notes (Signed)
03/01/14 2000  BiPAP/CPAP/SIPAP  BiPAP/CPAP/SIPAP Pt Type Adult  Mask Type Prongs-long  Set Rate 0 breaths/min  Respiratory Rate 18 breaths/min  IPAP 10 cmH20  EPAP 5 cmH2O  Flow Rate 3 lpm  BiPAP/CPAP/SIPAP BiPAP  Patient Home Equipment No (Only Nasal Prongs belong to patient)  BiPAP/CPAP /SiPAP Vitals  Pulse Rate 81  Resp 18  SpO2 94 %  Patient placed on BIPAP per home regimen with 3L O2 bleed in. He tolerates it very well at this time.

## 2014-03-01 NOTE — Op Note (Signed)
PATIENT ID:      Robert Gibbs  MRN:     [id number] DOB/AGE:    [birth year] / 64 y.o.       OPERATIVE REPORT    DATE OF PROCEDURE:  03/01/2014       PREOPERATIVE DIAGNOSIS:   OSTEOARTHRITIS LEFT KNEE      Estimated body mass index is 40.18 kg/(m^2) as calculated from the following:   Height as of this encounter: 5\' 10"  (1.778 m).   Weight as of this encounter: 127.007 kg (280 lb).                                                        POSTOPERATIVE DIAGNOSIS:   OSTEOARTHRITIS LEFT KNEE                                                                      PROCEDURE:  Procedure(s): TOTAL KNEE ARTHROPLASTY Using Depuy Sigma RP implants #4L Femur, #4Tibia, 33mm Sigma RP bearing, 38 Patella     SURGEON: Saphia Vanderford J    ASSISTANT:   [employer]   (Present and scrubbed throughout the case, critical for assistance with exposure, retraction, instrumentation, and closure.)         ANESTHESIA: GET, ACB, Exparel  DRAINS: 2 medium hemovac in knee   TOURNIQUET TIME: 62VOJ   COMPLICATIONS:  None     SPECIMENS: None   INDICATIONS FOR PROCEDURE: The patient has  OSTEOARTHRITIS LEFT KNEE, varus deformities, XR shows bone on bone arthritis. Patient has failed all conservative measures including anti-inflammatory medicines, narcotics, attempts at  exercise and weight loss, cortisone injections and viscosupplementation.  Risks and benefits of surgery have been discussed, questions answered.   DESCRIPTION OF PROCEDURE: The patient identified by armband, received  IV antibiotics, in the holding area at [facility]. Patient taken to the operating room, appropriate anesthetic  monitors were attached, and general endotracheal anesthesia induced with  the patient in supine position, Foley catheter was inserted. Tourniquet  applied high to the operative thigh. Lateral post and foot positioner  applied to the table, the lower extremity was then prepped and draped  in usual sterile fashion  from the ankle to the tourniquet. Time-out procedure was performed. The limb was wrapped with an Esmarch bandage and the tourniquet inflated to 350 mmHg. We began the operation by making the anterior midline incision starting at handbreadth above the patella going over the patella 1 cm medial to and  4 cm distal to the tibial tubercle. Small bleeders in the skin and the  subcutaneous tissue identified and cauterized. Transverse retinaculum was incised and reflected medially and a medial parapatellar arthrotomy was accomplished. the patella was everted and theprepatellar fat pad resected. The superficial medial collateral  ligament was then elevated from anterior to posterior along the proximal  flare of the tibia and anterior half of the menisci resected. The knee was hyperflexed exposing bone on bone arthritis. Peripheral and notch osteophytes as well as the cruciate ligaments were then resected. We continued to  work our way around posteriorly  along the proximal tibia, and externally  rotated the tibia subluxing it out from underneath the femur. A McHale  retractor was placed through the notch and a lateral Hohmann retractor  placed, and we then drilled through the proximal tibia in line with the  axis of the tibia followed by an intramedullary guide rod and 2-degree  posterior slope cutting guide. The tibial cutting guide was pinned into place  allowing resection of 8 mm of bone medially and about 12 mm of bone  laterally because of her varus deformity. Satisfied with the tibial resection, we then  entered the distal femur 2 mm anterior to the PCL origin with the  intramedullary guide rod and applied the distal femoral cutting guide  set at 40mm, with 5 degrees of valgus. This was pinned along the  epicondylar axis. At this point, the distal femoral cut was accomplished without difficulty. We then sized for a #4L femoral component and pinned the guide in 3 degrees of external rotation.The chamfer  cutting guide was pinned into place. The anterior, posterior, and chamfer cuts were accomplished without difficulty followed by  the box cutting guide and the box cut. We also removed posterior osteophytes from the posterior femoral condyles. At this  time, the knee was brought into full extension. We checked our  extension and flexion gaps and found them symmetric at 35mm.  The patella thickness measured at 25 mm. We set the cutting guide at 15 and removed the posterior 10 mm  of the patella, sized for a 38 button and drilled the lollipop. The knee  was then once again hyperflexed exposing the proximal tibia. We sized for a #4 tibial base plate, applied the smokestack and the conical reamer followed by the the Delta fin keel punch. We then hammered into place the Sigma RP trial femoral component, inserted a 10-mm trial bearing, trial patellar button, and took the knee through range of motion from 0-130 degrees. No thumb pressure was required for patellar  tracking. At this point, all trial components were removed, a double batch of DePuy HV cement with 1500 mg of Zinacef was mixed and applied to all bony metallic mating surfaces except for the posterior condyles of the femur itself. In order, we  hammered into place the tibial tray and removed excess cement, the femoral component and removed excess cement, a 10-mm Sigma RP bearing  was inserted, and the knee brought to full extension with compression.  The patellar button was clamped into place, and excess cement  removed. While the cement cured the wound was irrigated out with normal saline solution pulse lavage, and medium Hemovac drains were placed from an anterolateral  approach. Ligament stability and patellar tracking were checked and found to be excellent. The parapatellar arthrotomy was closed with  running #1 Vicryl suture. The subcutaneous tissue with 0 and 2-0 undyed  Vicryl suture, and the skin with skin staples. A dressing of Xeroform,   4 x 4, dressing sponges, Webril, and Ace wrap applied. The patient  awakened, extubated, and taken to recovery room without difficulty.   Frederik Pear J 03/01/2014, 2:37 PM

## 2014-03-01 NOTE — Progress Notes (Signed)
Orthopedic Tech Progress Note Patient Details:  Robert Gibbs 19-Sep-1950 [id number]  CPM Left Knee CPM Left Knee: On Left Knee Flexion (Degrees): 60 Left Knee Extension (Degrees): 0 Additional Comments: trapeze bar patient helper Viewed order from doctor's order list  Hildred Priest 03/01/2014, 3:56 PM

## 2014-03-01 NOTE — Transfer of Care (Signed)
Immediate Anesthesia Transfer of Care Note  Patient: Robert Gibbs  Procedure(s) Performed: Procedure(s): TOTAL KNEE ARTHROPLASTY (Left)  Patient Location: PACU  Anesthesia Type:General and Regional  Level of Consciousness: awake, alert , oriented and sedated  Airway & Oxygen Therapy: Patient Spontanous Breathing and Patient connected to nasal cannula oxygen  Post-op Assessment: Report given to PACU RN, Post -op Vital signs reviewed and stable and Patient moving all extremities  Post vital signs: Reviewed and stable  Complications: No apparent anesthesia complications

## 2014-03-01 NOTE — Progress Notes (Signed)
May give additional dilaudid 2 mg - per Dr Deatra Canter

## 2014-03-01 NOTE — Anesthesia Preprocedure Evaluation (Addendum)
Anesthesia Evaluation  Patient identified by MRN, date of birth, ID band Patient awake    Reviewed: Allergy & Precautions, H&P , NPO status , Patient's Chart, lab work & pertinent test results  Airway Mallampati: IV TM Distance: >3 FB Neck ROM: Full    Dental  (+) Teeth Intact, Dental Advisory Given   Pulmonary sleep apnea (BiPAP nightly) , former smoker,  breath sounds clear to auscultation        Cardiovascular hypertension, Pt. on medications and Pt. on home beta blockers Rhythm:Regular Rate:Normal     Neuro/Psych  Headaches, PSYCHIATRIC DISORDERS Anxiety Depression    GI/Hepatic Neg liver ROS, hiatal hernia, GERD-  ,  Endo/Other  Morbid obesity  Renal/GU negative Renal ROS     Musculoskeletal  (+) Arthritis -,   Abdominal   Peds  Hematology negative hematology ROS (+)   Anesthesia Other Findings   Reproductive/Obstetrics                          Anesthesia Physical Anesthesia Plan  ASA: III  Anesthesia Plan: General   Post-op Pain Management:    Induction: Intravenous  Airway Management Planned: Oral ETT  Additional Equipment: None  Intra-op Plan:   Post-operative Plan: Extubation in OR  Informed Consent: I have reviewed the patients History and Physical, chart, labs and discussed the procedure including the risks, benefits and alternatives for the proposed anesthesia with the patient or authorized representative who has indicated his/her understanding and acceptance.   Dental advisory given  Plan Discussed with: CRNA, Anesthesiologist and Surgeon  Anesthesia Plan Comments:         Anesthesia Quick Evaluation

## 2014-03-01 NOTE — Interval H&P Note (Signed)
History and Physical Interval Note:  03/01/2014 12:31 PM  Robert Gibbs  has presented today for surgery, with the diagnosis of OSTEOARTHRITIS LEFT KNEE  The various methods of treatment have been discussed with the patient and family. After consideration of risks, benefits and other options for treatment, the patient has consented to  Procedure(s): TOTAL KNEE ARTHROPLASTY (Left) as a surgical intervention .  The patient's history has been reviewed, patient examined, no change in status, stable for surgery.  I have reviewed the patient's chart and labs.  Questions were answered to the patient's satisfaction.     Kerin Salen

## 2014-03-02 ENCOUNTER — Encounter (HOSPITAL_COMMUNITY): Payer: Self-pay | Admitting: General Practice

## 2014-03-02 LAB — CBC
HCT: 38.1 % — ABNORMAL LOW (ref 39.0–52.0)
Hemoglobin: 12.6 g/dL — ABNORMAL LOW (ref 13.0–17.0)
MCH: 33.2 pg (ref 26.0–34.0)
MCHC: 33.1 g/dL (ref 30.0–36.0)
MCV: 100.3 fL — AB (ref 78.0–100.0)
PLATELETS: 189 10*3/uL (ref 150–400)
RBC: 3.8 MIL/uL — ABNORMAL LOW (ref 4.22–5.81)
RDW: 14.5 % (ref 11.5–15.5)
WBC: 9.5 10*3/uL (ref 4.0–10.5)

## 2014-03-02 NOTE — Progress Notes (Signed)
Patient ID: AUDEN TATAR, male   DOB: 07-25-1950, 63 y.o.   MRN: [id number] PATIENT ID: NADIR VASQUES  MRN: [id number]  DOB/AGE:  07/08/1950 / 63 y.o.  1 Day Post-Op Procedure(s) (LRB): TOTAL KNEE ARTHROPLASTY (Left)    PROGRESS NOTE Subjective: Patient is alert, oriented, no Nausea, no Vomiting, yes passing gas, no Bowel Movement. Taking PO well. Denies SOB, Chest or Calf Pain. Using Incentive Spirometer, PAS in place. Ambulate WBAT, CPM 0-40 Patient reports pain as 7 on 0-10 scale  .    Objective: Vital signs in last 24 hours: Filed Vitals:   03/01/14 2000 03/01/14 2200 03/02/14 0141 03/02/14 0552  BP:  131/74 170/79 150/78  Pulse: 81 82 79 82  Temp:  97.3 F (36.3 C) 98.4 F (36.9 C) 98.3 F (36.8 C)  TempSrc:      Resp: 18 16 16 16   Height:      Weight:      SpO2: 94% 94% 96% 97%      Intake/Output from previous day: I/O last 3 completed shifts: In: 1400 [I.V.:1400] Out: 1155 [Urine:600; Drains:505; Blood:50]   Intake/Output this shift: Total I/O In: 360 [P.O.:360] Out: -    LABORATORY DATA:  Recent Labs  03/01/14 1841 03/02/14 0640  WBC  --  9.5  HGB  --  12.6*  HCT  --  38.1*  PLT  --  189  GLUCAP 152*  --     Examination: Neurologically intact ABD soft Neurovascular intact Sensation intact distally Intact pulses distally Dorsiflexion/Plantar flexion intact Incision: dressing C/D/I No cellulitis present Compartment soft} Blood and plasma separated in drain indicating minimal recent drainage, drain pulled without difficulty.   Assessment:   1 Day Post-Op Procedure(s) (LRB): TOTAL KNEE ARTHROPLASTY (Left) ADDITIONAL DIAGNOSIS: Expected Acute Blood Loss Anemia, Hypertension, Sleep Apnea and obesity  Plan: PT/OT WBAT, CPM 5/hrs day until ROM 0-90 degrees, then D/C CPM, Bipap started DVT Prophylaxis:  SCDx72hrs, ASA 325 mg BID x 2 weeks DISCHARGE PLAN: Home DISCHARGE NEEDS: HHPT, HHRN, CPM, Walker and 3-in-1 comode seat     Azyria Osmon  J 03/02/2014, 8:48 AM

## 2014-03-02 NOTE — Evaluation (Signed)
Physical Therapy Evaluation Patient Details Name: Robert Gibbs MRN: [id number] DOB: 1950-08-29 Today's Date: 03/02/2014   History of Present Illness  63 y.o. male s/p left total knee arthroplasty.  Clinical Impression  Pt is s/p left TKA resulting in the deficits listed below (see PT Problem List). Limited ambulatory distance this AM secondary to pain. Motivated to improve function and willing to work with therapy, performing therapeutic exercises and transfer training. Pt will benefit from skilled PT to increase their independence and safety with mobility to allow discharge to the venue listed below.     Follow Up Recommendations Home health PT;Supervision for mobility/OOB    Equipment Recommendations  None recommended by PT    Recommendations for Other Services       Precautions / Restrictions Precautions Precautions: Knee Precaution Comments: Reviewed knee precautions Restrictions Weight Bearing Restrictions: Yes LLE Weight Bearing: Weight bearing as tolerated      Mobility  Bed Mobility Overal bed mobility: Needs Assistance Bed Mobility: Supine to Sit     Supine to sit: Min assist     General bed mobility comments: Min assist for LLE support off of bed. VC for technique. Heavy use of bed rail.  Transfers Overall transfer level: Needs assistance Equipment used: Rolling walker (2 wheeled) Transfers: Sit to/from Stand Sit to Stand: Min assist         General transfer comment: Min assist for boost from lowest bed setting. VC for hand placement and technique. Requires extra time. Assist to block RW.  Ambulation/Gait Ambulation/Gait assistance: Min guard Ambulation Distance (Feet): 6 Feet Assistive device: Rolling walker (2 wheeled) Gait Pattern/deviations: Step-to pattern;Decreased step length - right;Decreased stance time - left;Antalgic;Trunk flexed   Gait velocity interpretation: Below normal speed for age/gender General Gait Details: Very antalgic. Min  guard for safety. Educated on safe DME use with rolling walker. Distance limited by pain. Heavy use of UE for decreased weight bearing on LLE.  Stairs            Wheelchair Mobility    Modified Rankin (Stroke Patients Only)       Balance Overall balance assessment: Needs assistance Sitting-balance support: No upper extremity supported;Feet supported Sitting balance-Leahy Scale: Good     Standing balance support: Bilateral upper extremity supported Standing balance-Leahy Scale: Poor                               Pertinent Vitals/Pain Pain Assessment: 0-10 Pain Score:  ("really up there" no value given) Pain Location: Lt knee Pain Intervention(s): Limited activity within patient's tolerance;Monitored during session;Repositioned;Patient requesting pain meds-RN notified    Home Living Family/patient expects to be discharged to:: Private residence Living Arrangements: Spouse/significant other Available Help at Discharge: Family;Available 24 hours/day Type of Home: House Home Access: Stairs to enter Entrance Stairs-Rails: Left;Right;Can reach both Entrance Stairs-Number of Steps: 4 Home Layout: Two level;Able to live on main level with bedroom/bathroom Home Equipment: Walker - 2 wheels (3-in-1 commode)      Prior Function Level of Independence: Independent               Hand Dominance   Dominant Hand: Right    Extremity/Trunk Assessment   Upper Extremity Assessment: Defer to OT evaluation           Lower Extremity Assessment: LLE deficits/detail   LLE Deficits / Details: decreased strength and ROM as expected post op     Communication   Communication: No  difficulties  Cognition Arousal/Alertness: Awake/alert Behavior During Therapy: WFL for tasks assessed/performed Overall Cognitive Status: Within Functional Limits for tasks assessed                      General Comments      Exercises Total Joint Exercises Ankle  Circles/Pumps: AROM;Both;10 reps;Seated Quad Sets: AROM;Left;10 reps;Seated Long Arc Quad: Left;10 reps;AAROM;Seated Knee Flexion: AROM;Left;10 reps;Seated      Assessment/Plan    PT Assessment Patient needs continued PT services  PT Diagnosis Difficulty walking;Abnormality of gait;Acute pain   PT Problem List Decreased strength;Decreased range of motion;Decreased activity tolerance;Decreased balance;Decreased mobility;Decreased knowledge of use of DME;Decreased knowledge of precautions;Impaired sensation;Pain  PT Treatment Interventions DME instruction;Gait training;Stair training;Functional mobility training;Therapeutic activities;Therapeutic exercise;Balance training;Neuromuscular re-education;Patient/family education;Modalities   PT Goals (Current goals can be found in the Care Plan section) Acute Rehab PT Goals Patient Stated Goal: Go home PT Goal Formulation: With patient Time For Goal Achievement: 03/09/14 Potential to Achieve Goals: Good    Frequency 7X/week   Barriers to discharge        Co-evaluation               End of Session   Activity Tolerance: Patient limited by pain Patient left: in chair;with call bell/phone within reach Nurse Communication: Mobility status         Time: 6269-4854 PT Time Calculation (min): 25 min   Charges:   PT Evaluation $Initial PT Evaluation Tier I: 1 Procedure PT Treatments $Therapeutic Activity: 8-22 mins   PT G Codes:        Elayne Snare, [place]   Ellouise Newer 03/02/2014, 9:53 AM

## 2014-03-02 NOTE — Progress Notes (Signed)
Orthopedic Tech Progress Note Patient Details:  Robert Gibbs 1951/05/25 [id number] On cpm LLE 0-60 at 8:30 pm Patient ID: Robert Gibbs, male   DOB: 03-Sep-1950, 63 y.o.   MRN: [id number]   Robert Gibbs 03/02/2014, 8:32 PM

## 2014-03-02 NOTE — Care Management Note (Signed)
CARE MANAGEMENT NOTE 03/02/2014  Patient:  Robert Gibbs, Robert Gibbs   Account Number:  [id number]  Date Initiated:  03/02/2014  Documentation initiated by:  Ricki Miller  Subjective/Objective Assessment:   63 yr old male admitted with left knee osteoarthritis,  s/p left total knee arthroplasty.     Action/Plan:   Case manager spoke with patient concerning home health and DME needs at discharge. Patient states he was setup with [facility], CM called Miranda, [facility] liaison, she doesnt have him, but will accept referral. HAs RW and 3in1.   Anticipated DC Date:  03/02/2014   Anticipated DC Plan:  [place]  CM consult      P[facility] Choice  [facility]   Choice offered to / List presented to:  C-1 Patient   DME arranged  [facility]      DME agency  [employer]     [place] arranged  HH-2 PT      [place].   Status of service:  Completed, signed off Medicare Important Message given?   (If response is "NO", the following Medicare IM given date fields will be blank) Date Medicare IM given:   Medicare IM given by:   Date Additional Medicare IM given:   Additional Medicare IM given by:    Discharge Disposition:  [place]  Per UR Regulation:  Reviewed for med. necessity/level of care/duration of stay  If discussed at

## 2014-03-02 NOTE — Progress Notes (Signed)
Physical Therapy Treatment Patient Details Name: Robert Gibbs MRN: [id number] DOB: 1950/08/24 Today's Date: 03/02/2014    History of Present Illness 63 y.o. male s/p left total knee arthroplasty.    PT Comments    Pt progressing towards physical therapy goals, ambulating up to 45 feet with a rolling walker and moderately antalgic gait pattern. Tolerating therapeutic exercises and motivated to improve functional ability. Plan for stair training tomorrow. Patient will continue to benefit from skilled physical therapy services to further improve independence with functional mobility.   Follow Up Recommendations  Home health PT;Supervision for mobility/OOB     Equipment Recommendations  None recommended by PT    Recommendations for Other Services       Precautions / Restrictions Precautions Precautions: Knee Precaution Comments: Reviewed knee precautions Restrictions Weight Bearing Restrictions: Yes LLE Weight Bearing: Weight bearing as tolerated    Mobility  Bed Mobility                  Transfers Overall transfer level: Needs assistance Equipment used: Rolling walker (2 wheeled) Transfers: Sit to/from Stand Sit to Stand: Min assist         General transfer comment: Min assist for boost from recliner. VC for hand placement.  Ambulation/Gait Ambulation/Gait assistance: Min guard Ambulation Distance (Feet): 45 Feet Assistive device: Rolling walker (2 wheeled) Gait Pattern/deviations: Step-to pattern;Decreased step length - right;Decreased stance time - left;Antalgic;Trunk flexed   Gait velocity interpretation: Below normal speed for age/gender General Gait Details: Focused on upright posture with VC for left knee extension in stance phase for quad activation. Heavy use of UEs for support, bearing minimal weight through LLE. Min guard for safety.   Stairs            Wheelchair Mobility    Modified Rankin (Stroke Patients Only)       Balance                                     Cognition Arousal/Alertness: Awake/alert Behavior During Therapy: WFL for tasks assessed/performed Overall Cognitive Status: Within Functional Limits for tasks assessed                      Exercises Total Joint Exercises Ankle Circles/Pumps: AROM;Both;10 reps;Seated Quad Sets: AROM;Left;10 reps;Seated Long Arc Quad: Left;10 reps;AAROM;Seated Knee Flexion: AROM;Left;10 reps;Seated Goniometric ROM: 14-54 degrees left knee flexion in sitting.    General Comments General comments (skin integrity, edema, etc.): Reviewed knee precautions and importance of knee extension while resting.      Pertinent Vitals/Pain Pain Assessment: 0-10 Pain Score:  ("Very bad, pain medicine does not work" no value given) Pain Location: Lt knee Pain Intervention(s): Limited activity within patient's tolerance;Monitored during session;Premedicated before session;Repositioned    Home Living                      Prior Function            PT Goals (current goals can now be found in the care plan section) Acute Rehab PT Goals PT Goal Formulation: With patient Time For Goal Achievement: 03/09/14 Potential to Achieve Goals: Good Progress towards PT goals: Progressing toward goals    Frequency  7X/week    PT Plan Current plan remains appropriate    Co-evaluation             End of Session   Activity Tolerance:  Patient tolerated treatment well Patient left: in chair;with call bell/phone within reach;with family/visitor present     Time: 1607-3710 PT Time Calculation (min): 32 min  Charges:  $Gait Training: 8-22 mins $Therapeutic Exercise: 8-22 mins                    G Codes:      [employer], [place]  Ellouise Newer 03/02/2014, 3:39 PM

## 2014-03-02 NOTE — Progress Notes (Signed)
OT Cancellation Note  Patient Details Name: Robert Gibbs MRN: [id number] DOB: 1950/09/20   Cancelled Treatment:    Reason Eval/Treat Not Completed: Fatigue/lethargy limiting ability to participate (very fatigued from PT session; asked OT to come tomorrow)  Deran Barro A 03/02/2014, 3:13 PM

## 2014-03-03 LAB — CBC
HEMATOCRIT: 36.1 % — AB (ref 39.0–52.0)
HEMOGLOBIN: 12.1 g/dL — AB (ref 13.0–17.0)
MCH: 33.3 pg (ref 26.0–34.0)
MCHC: 33.5 g/dL (ref 30.0–36.0)
MCV: 99.4 fL (ref 78.0–100.0)
Platelets: 198 10*3/uL (ref 150–400)
RBC: 3.63 MIL/uL — AB (ref 4.22–5.81)
RDW: 14.2 % (ref 11.5–15.5)
WBC: 10.3 10*3/uL (ref 4.0–10.5)

## 2014-03-03 NOTE — Progress Notes (Signed)
PATIENT ID: Robert Gibbs  MRN: [id number]  DOB/AGE:  11/14/1950 / 63 y.o.  2 Days Post-Op Procedure(s) (LRB): TOTAL KNEE ARTHROPLASTY (Left)    PROGRESS NOTE Subjective: Patient is alert, oriented, no Nausea, no Vomiting, yes passing gas, no Bowel Movement. Taking PO well. Denies SOB, Chest or Calf Pain. Using Incentive Spirometer, PAS in place. Ambulate WBAT, CPM 0-40 Patient reports pain as 6 on 0-10 scale  .    Objective: Vital signs in last 24 hours: Filed Vitals:   03/03/14 0000 03/03/14 0003 03/03/14 0400 03/03/14 0559  BP:    178/83  Pulse:  104  94  Temp:    98.5 F (36.9 C)  TempSrc:      Resp: 16 16 18 18   Height:      Weight:      SpO2: 95% 95% 97% 97%      Intake/Output from previous day: I/O last 3 completed shifts: In: 1080 [P.O.:1080] Out: 2750 [Urine:2300; Drains:450]   Intake/Output this shift:     LABORATORY DATA:  Recent Labs  03/01/14 1841 03/02/14 0640 03/03/14 0610  WBC  --  9.5 10.3  HGB  --  12.6* 12.1*  HCT  --  38.1* 36.1*  PLT  --  189 198  GLUCAP 152*  --   --     Examination: Neurologically intact Neurovascular intact Sensation intact distally Intact pulses distally Dorsiflexion/Plantar flexion intact Incision: dressing C/D/I No cellulitis present Compartment soft}  Assessment:   2 Days Post-Op Procedure(s) (LRB): TOTAL KNEE ARTHROPLASTY (Left) ADDITIONAL DIAGNOSIS: Expected Acute Blood Loss Anemia, Hypertension, Sleep Apnea and obesity  Plan: PT/OT WBAT, CPM 5/hrs day until ROM 0-90 degrees, then D/C CPM DVT Prophylaxis:  SCDx72hrs, ASA 325 mg BID x 2 weeks DISCHARGE PLAN: Home later today when pt meets therapy goals. DISCHARGE NEEDS: HHPT, HHRN, CPM, Walker and 3-in-1 comode seat     Robert Gibbs R 03/03/2014, 8:35 AM

## 2014-03-03 NOTE — Progress Notes (Signed)
Pt placed on BiPAP via nasal prongs for night.  Currently tolerating well.

## 2014-03-03 NOTE — Progress Notes (Signed)
Physical Therapy Treatment Patient Details Name: Robert Gibbs MRN: [id number] DOB: 02-20-1951 Today's Date: 03/03/2014    History of Present Illness 63 y.o. male s/p left total knee arthroplasty.    PT Comments    Patient continues to progress well towards physical therapy goals, ambulating up to 150 feet with supervision while using a rolling walker. Feel he is adequate for d/c from a mobility standpoint; safely completed stair training this AM. All education has been reviewed. Patient will continue to benefit from skilled physical therapy services at home with HHPT to further improve independence with functional mobility.    Follow Up Recommendations  Home health PT;Supervision for mobility/OOB     Equipment Recommendations  None recommended by PT    Recommendations for Other Services       Precautions / Restrictions Precautions Precautions: Knee Precaution Comments: Reviewed knee precautions Restrictions Weight Bearing Restrictions: Yes LLE Weight Bearing: Weight bearing as tolerated    Mobility  Bed Mobility                  Transfers Overall transfer level: Needs assistance Equipment used: Rolling walker (2 wheeled) Transfers: Sit to/from Stand Sit to Stand: Supervision         General transfer comment: Supervision for safety from recliner. Correctly places hands on stable surface. Requires extra time. Decreased WB through LLE for support to stand.  Ambulation/Gait Ambulation/Gait assistance: Supervision Ambulation Distance (Feet): 150 Feet Assistive device: Rolling walker (2 wheeled) Gait Pattern/deviations: Step-to pattern;Step-through pattern;Decreased step length - right;Decreased stance time - left;Antalgic;Trunk flexed   Gait velocity interpretation: Below normal speed for age/gender General Gait Details: Continues to demonstrate moderately antalgic gait pattern. Therapy focused on upright posture and increasing symmetry of gait with slowly  emerging step-through gait pattern. No instances of buckling.   Stairs            Wheelchair Mobility    Modified Rankin (Stroke Patients Only)       Balance                                    Cognition Arousal/Alertness: Awake/alert Behavior During Therapy: WFL for tasks assessed/performed Overall Cognitive Status: Within Functional Limits for tasks assessed                      Exercises Total Joint Exercises Ankle Circles/Pumps: AROM;Both;10 reps;Seated Quad Sets: AROM;Both;10 reps;Seated Knee Flexion: AROM;Left;10 reps;Seated Goniometric ROM: 18-76 degrees flexion    General Comments        Pertinent Vitals/Pain Pain Assessment: 0-10 Pain Score: 8  Pain Location: Lt knee Pain Intervention(s): Limited activity within patient's tolerance;Monitored during session;Repositioned    Home Living                      Prior Function            PT Goals (current goals can now be found in the care plan section) Acute Rehab PT Goals PT Goal Formulation: With patient Time For Goal Achievement: 03/09/14 Potential to Achieve Goals: Good Progress towards PT goals: Progressing toward goals    Frequency  7X/week    PT Plan Current plan remains appropriate    Co-evaluation             End of Session   Activity Tolerance: Patient tolerated treatment well Patient left: in chair;with call bell/phone within reach;with family/visitor present  Time: 5183-4373 PT Time Calculation (min): 19 min  Charges:  $Gait Training: 8-22 mins                    G Codes:      Elayne Snare, [place]  Ellouise Newer 03/03/2014, 3:44 PM

## 2014-03-03 NOTE — Progress Notes (Signed)
Physical Therapy Treatment Patient Details Name: Robert Gibbs MRN: [id number] DOB: [birth year] Today's Date: 03/03/2014    History of Present Illness 63 y.o. male s/p left total knee arthroplasty.    PT Comments    Patient progressing well towards physical therapy goals, ambulating at a supervision level and safely completed stair training this AM. Requires min assist for LLE support to exit out of bed but will have 24 hour care from family upon d/c. All education has been reviewed. Feel he is adequate for d/c from a mobility standpoint when medically ready. Patient will continue to benefit from skilled physical therapy services at home with HHPT to further improve independence with functional mobility.   Follow Up Recommendations  Home health PT;Supervision for mobility/OOB     Equipment Recommendations  None recommended by PT    Recommendations for Other Services       Precautions / Restrictions Precautions Precautions: Knee Precaution Booklet Issued: Yes (comment) Precaution Comments: Reviewed knee precautions, exercises and handout Restrictions Weight Bearing Restrictions: Yes LLE Weight Bearing: Weight bearing as tolerated    Mobility  Bed Mobility Overal bed mobility: Needs Assistance Bed Mobility: Supine to Sit     Supine to sit: Min assist     General bed mobility comments: Min assist for LLE support out of bed. VC for technique. Requires extra time but performs task safely  Transfers Overall transfer level: Needs assistance Equipment used: Rolling walker (2 wheeled) Transfers: Sit to/from Stand Sit to Stand: Supervision         General transfer comment: Supervision for safety. Relies heavily on RW but places hands on bed for push-off. Encouraged increased use of LLE to assist with stand. Performed from lowest bed setting and recliner.  Ambulation/Gait Ambulation/Gait assistance: Supervision Ambulation Distance (Feet): 75 Feet (additional bout of 30  feet) Assistive device: Rolling walker (2 wheeled) Gait Pattern/deviations: Step-to pattern;Decreased step length - right;Decreased stance time - left;Antalgic;Trunk flexed   Gait velocity interpretation: Below normal speed for age/gender General Gait Details: VC for left glute and quad activation in stance phase for upright posture. Good control of RW with stepping backwards and turning. No loss of balance or buckling noted however continues to use UEs on RW heavily for support.   Stairs Stairs: Yes Stairs assistance: Supervision Stair Management: One rail Left;Step to pattern;Sideways Number of Stairs: 2 General stair comments: Demonstrated patient prior to having him practice. Educated on technique and sequencing. Pt was able to perform this task safely without physical assist.   Wheelchair Mobility    Modified Rankin (Stroke Patients Only)       Balance                                    Cognition Arousal/Alertness: Awake/alert Behavior During Therapy: WFL for tasks assessed/performed Overall Cognitive Status: Within Functional Limits for tasks assessed                      Exercises      General Comments        Pertinent Vitals/Pain Pain Assessment: 0-10 Pain Score:  ("Doing pretty good" no value given) Pain Location: Lt knee Pain Intervention(s): Limited activity within patient's tolerance;Monitored during session;Repositioned    Home Living Family/patient expects to be discharged to:: Private residence Living Arrangements: Spouse/significant other Available Help at Discharge: Family;Available 24 hours/day Type of Home: House Home Access: Stairs to enter Entrance  Stairs-Rails: Left;Right;Can reach both Home Layout: Two level;Able to live on main level with bedroom/bathroom Home Equipment: Gilford Rile - 2 wheels;Bedside commode      Prior Function Level of Independence: Independent          PT Goals (current goals can now be found in  the care plan section) Acute Rehab PT Goals Patient Stated Goal:  (go home) PT Goal Formulation: With patient Time For Goal Achievement: 03/09/14 Potential to Achieve Goals: Good Progress towards PT goals: Progressing toward goals    Frequency  7X/week    PT Plan Current plan remains appropriate    Co-evaluation             End of Session   Activity Tolerance: Patient tolerated treatment well Patient left: in chair;with call bell/phone within reach     Time: 0840-0911 PT Time Calculation (min): 31 min  Charges:  $Gait Training: 8-22 mins $Therapeutic Activity: 8-22 mins                    G Codes:      [employer], [place]  Ellouise Newer 03/03/2014, 9:55 AM

## 2014-03-03 NOTE — Discharge Summary (Signed)
Patient ID: Robert Gibbs MRN: [id number] DOB/AGE: May 03, 1951 63 y.o.  Admit date: 03/01/2014 Discharge date: 03/03/2014  Admission Diagnoses:  Active Problems:   Arthritis of knee, left   Discharge Diagnoses:  Same  Past Medical History  Diagnosis Date  . GERD (gastroesophageal reflux disease) 03/18/2012  . HTN (hypertension)   . Anxiety and depression   . Vitamin D deficiency   . Spinal stenosis 06-2012    saw neuro w/ "neuropathy", dx w/ spinal stenosis  . Hyperlipidemia   . Erectile dysfunction   . Varicose veins   . CTS (carpal tunnel syndrome)     h/o  . Barrett esophagus   . Wears glasses   . OSA (obstructive sleep apnea) 03/18/2012    on BiPap  . H/O hiatal hernia   . Headache(784.0)     sinus  . Arthritis   . BPH (benign prostatic hyperplasia)   . Edema of both legs     legs , ankles and feet  . Anxiety   . Depression     Surgeries: Procedure(s): TOTAL KNEE ARTHROPLASTY on 03/01/2014   Consultants:    Discharged Condition: Improved  Hospital Course: Robert Gibbs is an 63 y.o. male who was admitted 03/01/2014 for operative treatment of<principal problem not specified>. Patient has severe unremitting pain that affects sleep, daily activities, and work/hobbies. After pre-op clearance the patient was taken to the operating room on 03/01/2014 and underwent  Procedure(s): TOTAL KNEE ARTHROPLASTY.    Patient was given perioperative antibiotics: Anti-infectives   Start     Dose/Rate Route Frequency Ordered Stop   03/01/14 1355  cefUROXime (ZINACEF) injection  Status:  Discontinued       As needed 03/01/14 1355 03/01/14 1554   03/01/14 0600  ceFAZolin (ANCEF) 3 g in dextrose 5 % 50 mL IVPB     3 g 160 mL/hr over 30 Minutes Intravenous On call to O.R. 02/28/14 1414 03/01/14 1309       Patient was given sequential compression devices, early ambulation, and chemoprophylaxis to prevent DVT.  Patient benefited maximally from hospital stay and there were no  complications.    Recent vital signs: Patient Vitals for the past 24 hrs:  BP Temp Temp src Pulse Resp SpO2  03/03/14 0559 178/83 mmHg 98.5 F (36.9 C) - 94 18 97 %  03/03/14 0400 - - - - 18 97 %  03/03/14 0003 - - - 104 16 95 %  03/03/14 0000 - - - - 16 95 %  03/02/14 2215 - 99 F (37.2 C) Oral - - -  03/02/14 2117 138/72 mmHg 100.4 F (38 C) - 90 18 97 %  03/02/14 2000 - - - - 16 95 %  03/02/14 1253 181/76 mmHg 99.1 F (37.3 C) - 76 18 97 %  03/02/14 1006 150/78 mmHg - - - - -  03/02/14 1000 150/78 mmHg - - - - -     Recent laboratory studies:  Recent Labs  03/02/14 0640 03/03/14 0610  WBC 9.5 10.3  HGB 12.6* 12.1*  HCT 38.1* 36.1*  PLT 189 198     Discharge Medications:     Medication List    STOP taking these medications       oxyCODONE-acetaminophen 10-325 MG per tablet  Commonly known as:  PERCOCET      TAKE these medications       ALPRAZolam 0.5 MG tablet  Commonly known as:  XANAX  Take 0.5 mg by mouth at bedtime.  aspirin EC 325 MG tablet  Take 1 tablet (325 mg total) by mouth 2 (two) times daily.     b complex vitamins capsule  Take 1 capsule by mouth daily.     buPROPion 300 MG 24 hr tablet  Commonly known as:  WELLBUTRIN XL  TAKE 1 TABLET DAILY (NEED OFFICE VISIT)     buPROPion 300 MG 24 hr tablet  Commonly known as:  WELLBUTRIN XL  Take 300 mg by mouth daily.     CALCIUM 1200 1200-1000 MG-UNIT Chew  Chew 1 tablet by mouth daily.     Coenzyme Q10 200 MG capsule  Take 200 mg by mouth daily.     escitalopram 10 MG tablet  Commonly known as:  LEXAPRO  Take 1 tablet (10 mg total) by mouth daily.     FLEX OMEGA BENEFITS/VIT D-3 PO  Take 2 capsules by mouth daily.     furosemide 20 MG tablet  Commonly known as:  LASIX  Take 20 mg by mouth daily as needed for edema.     gabapentin 800 MG tablet  Commonly known as:  NEURONTIN  Take 1 tablet (800 mg total) by mouth 4 (four) times daily.     HYDROmorphone 2 MG tablet  Commonly  known as:  DILAUDID  Take 1 tablet (2 mg total) by mouth every 6 (six) hours as needed for severe pain.     lisinopril 40 MG tablet  Commonly known as:  PRINIVIL,ZESTRIL  Take 40 mg by mouth daily.     magnesium oxide 400 MG tablet  Commonly known as:  MAG-OX  Take 400 mg by mouth daily.     Melatonin 5 MG Caps  Take 10 mg by mouth at bedtime. Patient stated that he is taking 10 mg daily     methocarbamol 500 MG tablet  Commonly known as:  ROBAXIN  Take 1 tablet (500 mg total) by mouth 2 (two) times daily with a meal.     methocarbamol 500 MG tablet  Commonly known as:  ROBAXIN  Take 1 tablet (500 mg total) by mouth 2 (two) times daily with a meal.     metoprolol succinate 100 MG 24 hr tablet  Commonly known as:  TOPROL-XL  TAKE 1 TABLET DAILY WITH OR IMMEDIATELY FOLLOWING A MEAL (NEED OFFICE VISIT)     metoprolol succinate 100 MG 24 hr tablet  Commonly known as:  TOPROL-XL  Take 100 mg by mouth daily. Take with or immediately following a meal.     multivitamin with minerals tablet  Take 1 tablet by mouth daily.     NASACORT ALLERGY 24HR 55 MCG/ACT Aero nasal inhaler  Generic drug:  triamcinolone  Place 2 sprays into the nose at bedtime.     omeprazole 40 MG capsule  Commonly known as:  PRILOSEC  Take 40 mg by mouth 2 (two) times daily.     PROBIOTIC DAILY PO  Take 1 capsule by mouth daily.     pseudoephedrine 30 MG tablet  Commonly known as:  SUDAFED  Take 30 mg by mouth every 4 (four) hours as needed for congestion.     vitamin C 1000 MG tablet  Take 1,000 mg by mouth daily.        Diagnostic Studies: No results found.  Disposition: 03-Skilled Nursing Facility      Discharge Instructions   CPM    Complete by:  As directed   Continuous passive motion machine (CPM):      Use  the CPM from 0 to 60  for 5 hours per day.      You may increase by 10 degrees per day.  You may break it up into 2 or 3 sessions per day.      Use CPM for 2 weeks or until you are  told to stop.     Call MD / Call 911    Complete by:  As directed   If you experience chest pain or shortness of breath, CALL 911 and be transported to the hospital emergency room.  If you develope a fever above 101 F, pus (white drainage) or increased drainage or redness at the wound, or calf pain, call your surgeon's office.     Change dressing    Complete by:  As directed   Change dressing on 5, then change the dressing daily with sterile 4 x 4 inch gauze dressing and apply TED hose.  You may clean the incision with alcohol prior to redressing.     Constipation Prevention    Complete by:  As directed   Drink plenty of fluids.  Prune juice may be helpful.  You may use a stool softener, such as Colace (over the counter) 100 mg twice a day.  Use MiraLax (over the counter) for constipation as needed.     Diet - low sodium heart healthy    Complete by:  As directed      Discharge instructions    Complete by:  As directed   Follow up in office with Dr. Mayer Camel in 2 weeks.     Driving restrictions    Complete by:  As directed   No driving for 2 weeks     Increase activity slowly as tolerated    Complete by:  As directed      Patient may shower    Complete by:  As directed   You may shower without a dressing once there is no drainage.  Do not wash over the wound.  If drainage remains, cover wound with plastic wrap and then shower.           Follow-up Information   Follow up with Kerin Salen, MD In 2 weeks.   Specialty:  Orthopedic Surgery   Contact information:   [place] [address] [phone]       Follow up with [facility]. (Someone from [facility] will contact you concerning start date and time for physical therapy.)    Contact information:   [address] [place] [address] [phone]        Signed: Theodosia Quay 03/03/2014, 8:39 AM

## 2014-03-03 NOTE — Evaluation (Signed)
Occupational Therapy Evaluation Patient Details Name: Robert Gibbs MRN: [id number] DOB: Aug 14, 1950 Today's Date: 03/03/2014    History of Present Illness 64 y.o. male s/p left total knee arthroplasty.   Clinical Impression    Pt. Had previous knee sx in may and has AE and DME equipment. Pt. Able to use equipment appropriately. Pt. Robert Gibbs will be at home to A as needed. Pt. Does not require further skilled OT and pt. Is in agreement.     Follow Up Recommendations  No OT follow up    Equipment Recommendations  None recommended by OT    Recommendations for Other Services       Precautions / Restrictions Precautions Precautions: Knee Precaution Comments: Reviewed knee precautions Restrictions Weight Bearing Restrictions: Yes LLE Weight Bearing: Weight bearing as tolerated      Mobility Bed Mobility                  Transfers                 General transfer comment:  (Min Guard A)    Balance                                            ADL Overall ADL's : Needs assistance/impaired Eating/Feeding: Independent   Grooming: Wash/dry hands;Supervision/safety;Standing   Upper Body Bathing: Set up   Lower Body Bathing: Minimal assistance   Upper Body Dressing : Supervision/safety   Lower Body Dressing: Minimal assistance;With adaptive equipment   Toilet Transfer: Min guard           Functional mobility during ADLs: Min guard General ADL Comments:  (Pt. has AE and knows how to use it. )     Vision                     Perception     Praxis      Pertinent Vitals/Pain Pain Assessment: 0-10 Pain Score: 7  Pain Location:  (lt. knee) Pain Intervention(s): Monitored during session     Hand Dominance Right   Extremity/Trunk Assessment Upper Extremity Assessment Upper Extremity Assessment: Overall WFL for tasks assessed           Communication Communication Communication: No difficulties   Cognition  Arousal/Alertness: Awake/alert Behavior During Therapy: WFL for tasks assessed/performed Overall Cognitive Status: Within Functional Limits for tasks assessed                     General Comments       Exercises       Shoulder Instructions      Home Living Family/patient expects to be discharged to:: Private residence Living Arrangements: Spouse/significant other Available Help at Discharge: Family;Available 24 hours/day Type of Home: House Home Access: Stairs to enter [employer] of Steps: 4 Entrance Stairs-Rails: Left;Right;Can reach both Home Layout: Two level;Able to live on main level with bedroom/bathroom         Bathroom Toilet: Standard     Home Equipment: Walker - 2 wheels;Bedside commode          Prior Functioning/Environment Level of Independence: Independent             OT Diagnosis:     OT Problem List:     OT Treatment/Interventions:      OT Goals(Current goals can be found in the care plan section) Acute  Rehab OT Goals Patient Stated Goal:  (go home)  OT Frequency:     Barriers to D/C:            Co-evaluation              End of Session Equipment Utilized During Treatment: Rolling walker CPM Left Knee CPM Left Knee: Off Additional Comments: trapeze bar patient helper  Activity Tolerance: Patient tolerated treatment well Patient left: in chair;with call bell/phone within reach   Time: 3545-6256 OT Time Calculation (min): 39 min Charges:    G-Codes:    Robert Gibbs Mar 17, 2014, 9:43 AM

## 2014-03-08 ENCOUNTER — Encounter (HOSPITAL_BASED_OUTPATIENT_CLINIC_OR_DEPARTMENT_OTHER): Payer: Self-pay | Admitting: Emergency Medicine

## 2014-03-08 ENCOUNTER — Emergency Department (HOSPITAL_BASED_OUTPATIENT_CLINIC_OR_DEPARTMENT_OTHER)
Admission: EM | Admit: 2014-03-08 | Discharge: 2014-03-08 | Disposition: A | Discharge status: Home / Self Care | Payer: 59 | Attending: Emergency Medicine | Admitting: Emergency Medicine

## 2014-03-08 ENCOUNTER — Emergency Department (HOSPITAL_BASED_OUTPATIENT_CLINIC_OR_DEPARTMENT_OTHER): Payer: 59

## 2014-03-08 DIAGNOSIS — I10 Essential (primary) hypertension: Secondary | ICD-10-CM

## 2014-03-08 DIAGNOSIS — E559 Vitamin D deficiency, unspecified: Secondary | ICD-10-CM | POA: Diagnosis not present

## 2014-03-08 DIAGNOSIS — Z9889 Other specified postprocedural states: Secondary | ICD-10-CM | POA: Diagnosis not present

## 2014-03-08 DIAGNOSIS — G4733 Obstructive sleep apnea (adult) (pediatric): Secondary | ICD-10-CM | POA: Insufficient documentation

## 2014-03-08 DIAGNOSIS — F411 Generalized anxiety disorder: Secondary | ICD-10-CM | POA: Diagnosis not present

## 2014-03-08 DIAGNOSIS — L02419 Cutaneous abscess of limb, unspecified: Secondary | ICD-10-CM | POA: Diagnosis not present

## 2014-03-08 DIAGNOSIS — Z7982 Long term (current) use of aspirin: Secondary | ICD-10-CM | POA: Diagnosis not present

## 2014-03-08 DIAGNOSIS — Z87891 Personal history of nicotine dependence: Secondary | ICD-10-CM | POA: Diagnosis not present

## 2014-03-08 DIAGNOSIS — IMO0002 Reserved for concepts with insufficient information to code with codable children: Secondary | ICD-10-CM | POA: Diagnosis not present

## 2014-03-08 DIAGNOSIS — Z792 Long term (current) use of antibiotics: Secondary | ICD-10-CM | POA: Insufficient documentation

## 2014-03-08 DIAGNOSIS — F329 Major depressive disorder, single episode, unspecified: Secondary | ICD-10-CM | POA: Diagnosis not present

## 2014-03-08 DIAGNOSIS — K219 Gastro-esophageal reflux disease without esophagitis: Secondary | ICD-10-CM | POA: Insufficient documentation

## 2014-03-08 DIAGNOSIS — Z9981 Dependence on supplemental oxygen: Secondary | ICD-10-CM | POA: Insufficient documentation

## 2014-03-08 DIAGNOSIS — M129 Arthropathy, unspecified: Secondary | ICD-10-CM | POA: Insufficient documentation

## 2014-03-08 DIAGNOSIS — Z8739 Personal history of other diseases of the musculoskeletal system and connective tissue: Secondary | ICD-10-CM | POA: Insufficient documentation

## 2014-03-08 DIAGNOSIS — Z87448 Personal history of other diseases of urinary system: Secondary | ICD-10-CM | POA: Diagnosis not present

## 2014-03-08 DIAGNOSIS — F3289 Other specified depressive episodes: Secondary | ICD-10-CM | POA: Diagnosis not present

## 2014-03-08 DIAGNOSIS — L03119 Cellulitis of unspecified part of limb: Secondary | ICD-10-CM

## 2014-03-08 DIAGNOSIS — Z96659 Presence of unspecified artificial knee joint: Secondary | ICD-10-CM | POA: Diagnosis not present

## 2014-03-08 DIAGNOSIS — Z79899 Other long term (current) drug therapy: Secondary | ICD-10-CM | POA: Diagnosis not present

## 2014-03-08 DIAGNOSIS — L03116 Cellulitis of left lower limb: Secondary | ICD-10-CM

## 2014-03-08 LAB — CBC WITH DIFFERENTIAL/PLATELET
BASOS ABS: 0 10*3/uL (ref 0.0–0.1)
BASOS PCT: 0 % (ref 0–1)
EOS PCT: 2 % (ref 0–5)
Eosinophils Absolute: 0.1 10*3/uL (ref 0.0–0.7)
HCT: 32.9 % — ABNORMAL LOW (ref 39.0–52.0)
Hemoglobin: 11 g/dL — ABNORMAL LOW (ref 13.0–17.0)
Lymphocytes Relative: 16 % (ref 12–46)
Lymphs Abs: 1.2 10*3/uL (ref 0.7–4.0)
MCH: 33.6 pg (ref 26.0–34.0)
MCHC: 33.4 g/dL (ref 30.0–36.0)
MCV: 100.6 fL — AB (ref 78.0–100.0)
MONO ABS: 0.8 10*3/uL (ref 0.1–1.0)
Monocytes Relative: 11 % (ref 3–12)
Neutro Abs: 5 10*3/uL (ref 1.7–7.7)
Neutrophils Relative %: 71 % (ref 43–77)
Platelets: 275 10*3/uL (ref 150–400)
RBC: 3.27 MIL/uL — ABNORMAL LOW (ref 4.22–5.81)
RDW: 14 % (ref 11.5–15.5)
WBC: 7.1 10*3/uL (ref 4.0–10.5)

## 2014-03-08 LAB — COMPREHENSIVE METABOLIC PANEL
ALBUMIN: 3.2 g/dL — AB (ref 3.5–5.2)
ALT: 31 U/L (ref 0–53)
AST: 29 U/L (ref 0–37)
Alkaline Phosphatase: 67 U/L (ref 39–117)
Anion gap: 14 (ref 5–15)
BUN: 12 mg/dL (ref 6–23)
CALCIUM: 8.9 mg/dL (ref 8.4–10.5)
CO2: 26 meq/L (ref 19–32)
Chloride: 100 mEq/L (ref 96–112)
Creatinine, Ser: 0.7 mg/dL (ref 0.50–1.35)
GFR calc Af Amer: >90 mL/min (ref >90)
Glucose, Bld: 114 mg/dL — ABNORMAL HIGH (ref 70–99)
Potassium: 3.7 mEq/L (ref 3.7–5.3)
Sodium: 140 mEq/L (ref 137–147)
Total Bilirubin: 0.5 mg/dL (ref 0.3–1.2)
Total Protein: 6.7 g/dL (ref 6.0–8.3)

## 2014-03-08 LAB — TROPONIN I

## 2014-03-08 MED ORDER — CEFAZOLIN SODIUM 1-5 GM-% IV SOLN
1.0000 g | Freq: Once | INTRAVENOUS | Status: AC
  Administered 2014-03-08: 1 g via INTRAVENOUS
  Filled 2014-03-08: qty 50

## 2014-03-08 MED ORDER — CEPHALEXIN 500 MG PO CAPS: 500.0000 mg | ORAL_CAPSULE | Freq: Four times a day (QID) | ORAL | Status: DC

## 2014-03-08 NOTE — ED Provider Notes (Addendum)
CSN: [id number]     Arrival date & time 03/08/14  1650 History   First MD Initiated Contact with Patient 03/08/14 1703     Chief Complaint  Patient presents with  . Hypertension     (Consider location/radiation/quality/duration/timing/severity/associated sxs/prior Treatment) HPI Comments: Patient referred to ED by a physical therapist today. His blood pressure was elevated and the physical therapist could not find a accurate pulse ox reading. Patient states he did not take his blood pressure medication yesterday or today. He underwent total knee arthroplasty on the left on September 9 by Dr. Mayer Camel. He's noticed increased redness of his shin since last night. Denies fever. Denies vomiting. He feels generally weak. Denies any abdominal pain. Denies any chest pain, shortness of breath, headache or visual change. He takes metoprolol and lisinopril for his blood pressure normally. He denies any focal weakness, numbness or tingling. No bowel or bladder incontinence. Physical therapist was also worried about blood clot.  The history is provided by the patient and a relative.    Past Medical History  Diagnosis Date  . GERD (gastroesophageal reflux disease) 03/18/2012  . HTN (hypertension)   . Anxiety and depression   . Vitamin D deficiency   . Spinal stenosis 06-2012    saw neuro w/ "neuropathy", dx w/ spinal stenosis  . Hyperlipidemia   . Erectile dysfunction   . Varicose veins   . CTS (carpal tunnel syndrome)     h/o  . Barrett esophagus   . Wears glasses   . OSA (obstructive sleep apnea) 03/18/2012    on BiPap  . H/O hiatal hernia   . Headache(784.0)     sinus  . Arthritis   . BPH (benign prostatic hyperplasia)   . Edema of both legs     legs , ankles and feet  . Anxiety   . Depression    Past Surgical History  Procedure Laterality Date  . Elbow surgery  1992    for tennis elbow-rt  . Foot surgery      R, for plantar fasciitis  . Prostate biopsy      2010, (-)  . Back  surgery  2002    L4-L5  . Hernia repair      x 2 as a child-ing   . Uvulopalatopharyngoplasty (uppp)/tonsillectomy/septoplasty  1990  . Colonoscopy    . Upper gastrointestinal endoscopy    . Arch bar removal    . Ligation / division saphenous vein      both legs  . Knee arthroscopy with meniscal repair Bilateral 05/04/2013    Procedure: BILATERAL KNEE ARTHROSCOPY WITH POSSIBLE CHONDROPLASTY AND MENISCECTOMY ;  Surgeon: Kerin Salen, MD;  Location: [facility];  Service: Orthopedics;  Laterality: Bilateral;  Right chrondroplasty, debridement, , partial medial menisectomy, removal of loose bodies.   . Tonsillectomy    . Eye surgery Right     cataract with lens implant  . 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 Total knee arthroplasty Right 11/07/2013    Procedure: TOTAL KNEE ARTHROPLASTY;  Surgeon: Kerin Salen, MD;  Location: [facility];  Service: Orthopedics;  Laterality: Right;  . Total knee arthroplasty Left 03/01/2014    Dr Mayer Camel  . Total knee arthroplasty Left 03/01/2014    Procedure: TOTAL KNEE ARTHROPLASTY;  Surgeon: Kerin Salen, MD;  Location: [facility];  Service: Orthopedics;  Laterality: Left;   Family History  Problem Relation Age of Onset  . Colon cancer Neg Hx   .  Prostate cancer Neg Hx   . Diabetes Neg Hx   . Cancer Neg Hx   . CAD Father     ?   History  Substance Use Topics  . Smoking status: Former Smoker -- 1.00 packs/day for 40 years    Quit date: 06/29/2008  . Smokeless tobacco: Never Used     Comment: quit 2010  . Alcohol Use: 1.5 oz/week    3 drink(s) per week     Comment: 2-3 large drinks, Vodka, 2-3 times a week    Review of Systems  Constitutional: Negative for fever, activity change and appetite change.  HENT: Negative for congestion and rhinorrhea.   Respiratory: Negative for cough and chest tightness.   Cardiovascular: Negative for chest pain.  Gastrointestinal: Negative for nausea, vomiting and abdominal pain.   Genitourinary: Negative for dysuria and hematuria.  Musculoskeletal: Positive for arthralgias and myalgias.  Skin: Positive for wound.  Neurological: Positive for weakness. Negative for dizziness, light-headedness and headaches.  A complete 10 system review of systems was obtained and all systems are negative except as noted in the HPI and PMH.      Allergies  Codeine; Nsaids; and Simvastatin  Home Medications   Prior to Admission medications   Medication Sig Start Date End Date Taking? Authorizing Provider  ALPRAZolam Duanne Moron) 0.5 MG tablet Take 0.5 mg by mouth at bedtime.    Historical Provider, MD  Ascorbic Acid (VITAMIN C) 1000 MG tablet Take 1,000 mg by mouth daily.    Historical Provider, MD  aspirin EC 325 MG tablet Take 1 tablet (325 mg total) by mouth 2 (two) times daily. 03/01/14   Leighton Parody, PA-C  b complex vitamins capsule Take 1 capsule by mouth daily.    Historical Provider, MD  buPROPion (WELLBUTRIN XL) 300 MG 24 hr tablet Take 300 mg by mouth daily.    Historical Provider, MD  buPROPion (WELLBUTRIN XL) 300 MG 24 hr tablet TAKE 1 TABLET DAILY (NEED OFFICE VISIT) 02/21/14   Colon Branch, MD  Calcium Carbonate-Vit D-Min (CALCIUM 1200) 1200-1000 MG-UNIT CHEW Chew 1 tablet by mouth daily.     Historical Provider, MD  cephALEXin (KEFLEX) 500 MG capsule Take 1 capsule (500 mg total) by mouth 4 (four) times daily. 03/08/14   Ezequiel Essex, MD  Coenzyme Q10 200 MG capsule Take 200 mg by mouth daily.    Historical Provider, MD  escitalopram (LEXAPRO) 10 MG tablet Take 1 tablet (10 mg total) by mouth daily. 02/06/14   Colon Branch, MD  furosemide (LASIX) 20 MG tablet Take 20 mg by mouth daily as needed for edema.    Historical Provider, MD  gabapentin (NEURONTIN) 800 MG tablet Take 1 tablet (800 mg total) by mouth 4 (four) times daily. 03/31/13   Colon Branch, MD  HYDROmorphone (DILAUDID) 2 MG tablet Take 1 tablet (2 mg total) by mouth every 6 (six) hours as needed for severe pain.  03/01/14   Leighton Parody, PA-C  lisinopril (PRINIVIL,ZESTRIL) 40 MG tablet Take 40 mg by mouth daily.    Historical Provider, MD  magnesium oxide (MAG-OX) 400 MG tablet Take 400 mg by mouth daily.    Historical Provider, MD  Melatonin 5 MG CAPS Take 10 mg by mouth at bedtime. Patient stated that he is taking 10 mg daily    Historical Provider, MD  methocarbamol (ROBAXIN) 500 MG tablet Take 1 tablet (500 mg total) by mouth 2 (two) times daily with a meal. 11/07/13  Leighton Parody, PA-C  methocarbamol (ROBAXIN) 500 MG tablet Take 1 tablet (500 mg total) by mouth 2 (two) times daily with a meal. 03/01/14   Leighton Parody, PA-C  metoprolol succinate (TOPROL-XL) 100 MG 24 hr tablet Take 100 mg by mouth daily. Take with or immediately following a meal.    Historical Provider, MD  metoprolol succinate (TOPROL-XL) 100 MG 24 hr tablet TAKE 1 TABLET DAILY WITH OR IMMEDIATELY FOLLOWING A MEAL (NEED OFFICE VISIT) 02/21/14   Colon Branch, MD  Multiple Vitamins-Minerals (MULTIVITAMIN WITH MINERALS) tablet Take 1 tablet by mouth daily.    Historical Provider, MD  Omega-3 Fat Ac-Cholecalciferol (FLEX OMEGA BENEFITS/VIT D-3 PO) Take 2 capsules by mouth daily.    Historical Provider, MD  omeprazole (PRILOSEC) 40 MG capsule Take 40 mg by mouth 2 (two) times daily.    Historical Provider, MD  Probiotic Product (PROBIOTIC DAILY PO) Take 1 capsule by mouth daily.    Historical Provider, MD  pseudoephedrine (SUDAFED) 30 MG tablet Take 30 mg by mouth every 4 (four) hours as needed for congestion.    Historical Provider, MD  triamcinolone (NASACORT ALLERGY 24HR) 55 MCG/ACT AERO nasal inhaler Place 2 sprays into the nose at bedtime.    Historical Provider, MD   BP 164/77  Pulse 85  Temp(Src) 99.2 F (37.3 C) (Oral)  Resp 18  Ht 5\' 10"  (1.778 m)  Wt 280 lb (127.007 kg)  BMI 40.18 kg/m2  SpO2 100% Physical Exam  Nursing note and vitals reviewed. Constitutional: He is oriented to person, place, and time. He appears  well-developed and well-nourished. No distress.  HENT:  Head: Normocephalic and atraumatic.  Mouth/Throat: Oropharynx is clear and moist. No oropharyngeal exudate.  Eyes: Conjunctivae and EOM are normal. Pupils are equal, round, and reactive to light.  Neck: Normal range of motion. Neck supple.  No meningismus  Cardiovascular: Normal rate, regular rhythm, normal heart sounds and intact distal pulses.   No murmur heard. Pulmonary/Chest: Effort normal and breath sounds normal. No respiratory distress.  Abdominal: Soft. There is no tenderness. There is no rebound and no guarding.  Musculoskeletal: Normal range of motion. He exhibits tenderness. He exhibits no edema.  Staples intact overlying left knee with minimal surrounding erythema. No drainage.  Redness and warmth to the left shin that is not circumferential. Compartments soft. See photos. +2 DP and PT pulses  Neurological: He is alert and oriented to person, place, and time. No cranial nerve deficit. He exhibits normal muscle tone. Coordination normal.  5/5 strength throughout, CN 2-12 intact, no ataxia on finger to nose, no nystagmus  Skin: Skin is warm and dry. No rash noted.  Psychiatric: He has a normal mood and affect. His behavior is normal.        ED Course  Procedures (including critical care time) Labs Review Labs Reviewed  CBC WITH DIFFERENTIAL - Abnormal; Notable for the following:    RBC 3.27 (*)    Hemoglobin 11.0 (*)    HCT 32.9 (*)    MCV 100.6 (*)    All other components within normal limits  COMPREHENSIVE METABOLIC PANEL - Abnormal; Notable for the following:    Glucose, Bld 114 (*)    Albumin 3.2 (*)    All other components within normal limits  TROPONIN I    Imaging Review US Venous Img Lower Unilateral Left  03/08/2014   CLINICAL DATA:  Red and swollen left lower extremity after total knee replacement 1 week ago. Symptoms for  5 days.  EXAM: LEFT LOWER EXTREMITY VENOUS DOPPLER ULTRASOUND  TECHNIQUE:  Gray-scale sonography with graded compression, as well as color Doppler and duplex ultrasound were performed to evaluate the lower extremity deep venous systems from the level of the common femoral vein and including the common femoral, femoral, profunda femoral, popliteal and calf veins including the posterior tibial, peroneal and gastrocnemius veins when visible. The superficial great saphenous vein was also interrogated. Spectral Doppler was utilized to evaluate flow at rest and with distal augmentation maneuvers in the common femoral, femoral and popliteal veins.  COMPARISON:  None.  FINDINGS: Common Femoral Vein: No evidence of thrombus. Normal compressibility, respiratory phasicity and response to augmentation.  Saphenofemoral Junction: No evidence of thrombus. Normal compressibility and flow on color Doppler imaging.  Profunda Femoral Vein: No evidence of thrombus. Normal compressibility and flow on color Doppler imaging.  Femoral Vein: No evidence of thrombus. Normal compressibility, respiratory phasicity and response to augmentation.  Popliteal Vein: No evidence of thrombus. Normal compressibility, respiratory phasicity and response to augmentation.  Calf Veins: No evidence of thrombus. Normal compressibility and flow on color Doppler imaging.  Superficial Great Saphenous Vein: No evidence of thrombus. Normal compressibility and flow on color Doppler imaging.  Venous Reflux:  None.  Other Findings:  There is significant edema in the calf.  IMPRESSION: 1. No evidence for occlusive deep venous thrombosis. 2. Significant calf edema.   Electronically Signed   By: Shon Hale M.D.   On: 03/08/2014 18:59     EKG Interpretation   Date/Time:  Wednesday March 08 2014 17:35:23 EDT Ventricular Rate:  86 PR Interval:  154 QRS Duration: 152 QT Interval:  434 QTC Calculation: 519 R Axis:   -56 Text Interpretation:  Normal sinus rhythm Left axis deviation Right bundle  branch block Abnormal ECG No  significant change was found Confirmed by  Wyvonnia Dusky  MD, Joliene Salvador [phone]) on 03/08/2014 5:45:12 PM      MDM   Final diagnoses:  Essential hypertension  Cellulitis of left leg   Recent knee arthroplasty one week ago presenting with elevated blood pressure, erythema to the left leg and generalized weakness. Neurovascularly intact.  MAXIMUM TEMPERATURE 99.2. No leukocytosis. Patient took his blood pressure medications as before arrival. It has improved to 786 systolic. No evidence of damage. Doppler is negative for DVT. Compartments soft.  Case discussed with Dr. Berenice Primas on call for Dr. Mayer Camel. Dr. Berenice Primas reviewed photographs through [facility]. He agrees area of redness could be normal postoperative change versus early cellulitis. Recommends 1 g Ancef, Keflex and followup in the office tomorrow.  Patient and wife in agreement.  Stressed importance of compliance with medications. Return precautions discussed.  BP 164/77  Pulse 85  Temp(Src) 99.2 F (37.3 C) (Oral)  Resp 18  Ht 5\' 10"  (1.778 m)  Wt 280 lb (127.007 kg)  BMI 40.18 kg/m2  SpO2 100%  Ezequiel Essex, MD 03/08/14 1904  Ezequiel Essex, MD 03/09/14 [phone]

## 2014-03-08 NOTE — ED Notes (Signed)
C/o elevated BP x 2 days-pt was advised by  Home PT yesterday-not sure if took BP meds yesterday to today-recent surgery-bilat knee

## 2014-03-08 NOTE — ED Notes (Signed)
Pt in US

## 2014-03-08 NOTE — Discharge Instructions (Signed)
Cellulitis Take your medications as prescribed. Followup with Dr. Mayer Camel tomorrow in the office to look at her leg. Return to ED for worsening pain, fever, any other concerns. Cellulitis is an infection of the skin and the tissue beneath it. The infected area is usually red and tender. Cellulitis occurs most often in the arms and lower legs.  CAUSES  Cellulitis is caused by bacteria that enter the skin through cracks or cuts in the skin. The most common types of bacteria that cause cellulitis are staphylococci and streptococci. SIGNS AND SYMPTOMS   Redness and warmth.  Swelling.  Tenderness or pain.  Fever. DIAGNOSIS  Your health care provider can usually determine what is wrong based on a physical exam. Blood tests may also be done. TREATMENT  Treatment usually involves taking an antibiotic medicine. HOME CARE INSTRUCTIONS   Take your antibiotic medicine as directed by your health care provider. Finish the antibiotic even if you start to feel better.  Keep the infected arm or leg elevated to reduce swelling.  Apply a warm cloth to the affected area up to 4 times per day to relieve pain.  Take medicines only as directed by your health care provider.  Keep all follow-up visits as directed by your health care provider. SEEK MEDICAL CARE IF:   You notice red streaks coming from the infected area.  Your red area gets larger or turns dark in color.  Your bone or joint underneath the infected area becomes painful after the skin has healed.  Your infection returns in the same area or another area.  You notice a swollen bump in the infected area.  You develop new symptoms.  You have a fever. SEEK IMMEDIATE MEDICAL CARE IF:   You feel very sleepy.  You develop vomiting or diarrhea.  You have a general ill feeling (malaise) with muscle aches and pains. MAKE SURE YOU:   Understand these instructions.  Will watch your condition.  Will get help right away if you are not  doing well or get worse. Document Released: 03/19/2005 Document Revised: 10/24/2013 Document Reviewed: 08/25/2011 [facility] Patient Information 2015 [place], [place]. This information is not intended to replace advice given to you by your health care provider. Make sure you discuss any questions you have with your health care provider.

## 2014-03-08 NOTE — ED Notes (Signed)
MD at bedside. 

## 2014-03-14 ENCOUNTER — Telehealth (INDEPENDENT_AMBULATORY_CARE_PROVIDER_SITE_OTHER): Payer: 59 | Admitting: Internal Medicine

## 2014-03-14 DIAGNOSIS — Z23 Encounter for immunization: Secondary | ICD-10-CM

## 2014-03-14 NOTE — Telephone Encounter (Signed)
Error/gd °

## 2014-03-28 ENCOUNTER — Ambulatory Visit: Payer: 59 | Attending: Orthopedic Surgery | Admitting: Physical Therapy

## 2014-03-28 DIAGNOSIS — M25662 Stiffness of left knee, not elsewhere classified: Secondary | ICD-10-CM | POA: Diagnosis not present

## 2014-03-28 DIAGNOSIS — Z96652 Presence of left artificial knee joint: Secondary | ICD-10-CM | POA: Insufficient documentation

## 2014-03-28 DIAGNOSIS — R609 Edema, unspecified: Secondary | ICD-10-CM | POA: Diagnosis not present

## 2014-03-28 DIAGNOSIS — I10 Essential (primary) hypertension: Secondary | ICD-10-CM | POA: Diagnosis not present

## 2014-03-28 DIAGNOSIS — M810 Age-related osteoporosis without current pathological fracture: Secondary | ICD-10-CM | POA: Diagnosis not present

## 2014-03-28 DIAGNOSIS — R262 Difficulty in walking, not elsewhere classified: Secondary | ICD-10-CM | POA: Insufficient documentation

## 2014-03-28 DIAGNOSIS — M25562 Pain in left knee: Secondary | ICD-10-CM | POA: Diagnosis not present

## 2014-03-28 DIAGNOSIS — G629 Polyneuropathy, unspecified: Secondary | ICD-10-CM | POA: Diagnosis not present

## 2014-03-30 ENCOUNTER — Ambulatory Visit: Payer: 59 | Admitting: Physical Therapy

## 2014-03-30 DIAGNOSIS — M25562 Pain in left knee: Secondary | ICD-10-CM | POA: Diagnosis not present

## 2014-04-04 ENCOUNTER — Ambulatory Visit: Payer: 59 | Admitting: Physical Therapy

## 2014-04-07 ENCOUNTER — Ambulatory Visit: Payer: 59 | Admitting: Physical Therapy

## 2014-04-13 ENCOUNTER — Ambulatory Visit: Payer: 59 | Admitting: Physical Therapy

## 2014-04-13 DIAGNOSIS — M25562 Pain in left knee: Secondary | ICD-10-CM | POA: Diagnosis not present

## 2014-04-15 ENCOUNTER — Other Ambulatory Visit: Payer: Self-pay | Admitting: Internal Medicine

## 2014-04-17 ENCOUNTER — Telehealth: Payer: Self-pay | Admitting: Internal Medicine

## 2014-04-17 MED ORDER — GABAPENTIN 800 MG PO TABS: ORAL_TABLET | ORAL | Status: DC

## 2014-04-17 NOTE — Telephone Encounter (Signed)
[facility] Pt will need to be seen by Dr. Larose Kells to have Lexapro medication dosage changed. Please inform Pt he will need to make an appt for that.

## 2014-04-17 NOTE — Telephone Encounter (Signed)
Caller name: Kealan, Buchan Relation to pt: self  Call back number: [phone] [phone]   Reason for call:   pt requesting a 2 week refill of  gabapentin (NEURONTIN) 800 MG tablet please send to retail to whole him over until he receives the rx in the mail. Pt also requesting a refill of  escitalopram (LEXAPRO) 10 MG tablet requesting to up the strength.

## 2014-04-18 ENCOUNTER — Ambulatory Visit (INDEPENDENT_AMBULATORY_CARE_PROVIDER_SITE_OTHER): Payer: 59 | Admitting: *Deleted

## 2014-04-18 ENCOUNTER — Ambulatory Visit: Payer: 59 | Admitting: Physical Therapy

## 2014-04-18 DIAGNOSIS — Z23 Encounter for immunization: Secondary | ICD-10-CM

## 2014-04-18 DIAGNOSIS — M25562 Pain in left knee: Secondary | ICD-10-CM | POA: Diagnosis not present

## 2014-04-18 MED ORDER — ESCITALOPRAM OXALATE 20 MG PO TABS: 20.0000 mg | ORAL_TABLET | Freq: Every day | ORAL | Status: DC

## 2014-04-18 NOTE — Telephone Encounter (Signed)
[facility] Pt informed he must make appt within 3 weeks for F/U.

## 2014-04-18 NOTE — Telephone Encounter (Signed)
Okay to increase Lexapro to 20 mg 1 tablet daily, call #30, no refills. Schedule a follow-up 3 weeks from today for reassessment of anxiety and depression. Call anytime is symptoms severe or suicidal ideas

## 2014-04-18 NOTE — Telephone Encounter (Signed)
Pt requesting to up his dosage on the lexapro and to have the new [facility]. Pt states that current dose is not really helping. Please advise.

## 2014-04-18 NOTE — Addendum Note (Signed)
Addended by: Peggyann Shoals on: 04/18/2014 10:11 AM   Modules accepted: Orders

## 2014-04-20 ENCOUNTER — Ambulatory Visit: Payer: 59 | Admitting: Physical Therapy

## 2014-04-20 DIAGNOSIS — M25562 Pain in left knee: Secondary | ICD-10-CM | POA: Diagnosis not present

## 2014-04-24 ENCOUNTER — Telehealth: Payer: Self-pay

## 2014-04-24 MED ORDER — ALPRAZOLAM 0.5 MG PO TABS: 0.5000 mg | ORAL_TABLET | Freq: Every day | ORAL | Status: DC

## 2014-04-24 NOTE — Telephone Encounter (Signed)
Pt is requesting refill on Alprazolam  Last OV: 02/06/2014 Last Fill: 01/20/2014 # 90 0RF UDS: 11/30/2013 Moderate risk. Pt never returned for repeat UDS  Please advise.

## 2014-04-24 NOTE — Telephone Encounter (Signed)
Informed Pt that he is due for UDS and will not be able to fax to [facility] until we have received a urine sample from him. Pt verbalized understanding and informed me that he will try to make it to the office sometime tomorrow.

## 2014-04-24 NOTE — Telephone Encounter (Signed)
Okay #30, no refills, needs UDS at time of pickup

## 2014-04-25 ENCOUNTER — Ambulatory Visit: Payer: 59 | Attending: Orthopedic Surgery | Admitting: Physical Therapy

## 2014-04-25 DIAGNOSIS — I10 Essential (primary) hypertension: Secondary | ICD-10-CM | POA: Diagnosis not present

## 2014-04-25 DIAGNOSIS — M25562 Pain in left knee: Secondary | ICD-10-CM | POA: Diagnosis not present

## 2014-04-25 DIAGNOSIS — G629 Polyneuropathy, unspecified: Secondary | ICD-10-CM | POA: Insufficient documentation

## 2014-04-25 DIAGNOSIS — R262 Difficulty in walking, not elsewhere classified: Secondary | ICD-10-CM | POA: Insufficient documentation

## 2014-04-25 DIAGNOSIS — R609 Edema, unspecified: Secondary | ICD-10-CM | POA: Insufficient documentation

## 2014-04-25 DIAGNOSIS — M810 Age-related osteoporosis without current pathological fracture: Secondary | ICD-10-CM | POA: Diagnosis not present

## 2014-04-25 DIAGNOSIS — M25662 Stiffness of left knee, not elsewhere classified: Secondary | ICD-10-CM | POA: Diagnosis not present

## 2014-04-25 DIAGNOSIS — Z96652 Presence of left artificial knee joint: Secondary | ICD-10-CM | POA: Insufficient documentation

## 2014-04-27 ENCOUNTER — Ambulatory Visit: Payer: 59 | Admitting: Physical Therapy

## 2014-04-27 DIAGNOSIS — M25562 Pain in left knee: Secondary | ICD-10-CM | POA: Diagnosis not present

## 2014-05-02 ENCOUNTER — Ambulatory Visit: Payer: 59 | Admitting: Physical Therapy

## 2014-05-02 DIAGNOSIS — M25562 Pain in left knee: Secondary | ICD-10-CM | POA: Diagnosis not present

## 2014-05-02 NOTE — Telephone Encounter (Signed)
Faxed to [facility].

## 2014-05-02 NOTE — Telephone Encounter (Signed)
Person :Robert Gibbs number:  [phone]  Pt came in 05-02-14 to lab for USD so he can received rx  ALPRAZolam Duanne Moron) 0.5 MG tablet  to be sent [facility]. Pt states need med rush, has 4 to 5 days med left.

## 2014-05-02 NOTE — Telephone Encounter (Signed)
Pt came in today 05-02-14 to do lab  USD for rx to be sent at [place].

## 2014-05-04 ENCOUNTER — Ambulatory Visit: Payer: 59 | Admitting: Physical Therapy

## 2014-05-04 DIAGNOSIS — M25562 Pain in left knee: Secondary | ICD-10-CM | POA: Diagnosis not present

## 2014-05-05 ENCOUNTER — Telehealth: Payer: Self-pay | Admitting: Internal Medicine

## 2014-05-05 NOTE — Telephone Encounter (Signed)
Caller name: roper Relation to pt: self Call back number: [phone] Pharmacy:  Reason for call:   Patient requesting last urine results

## 2014-05-05 NOTE — Telephone Encounter (Signed)
UDS not received back yet.

## 2014-05-09 ENCOUNTER — Ambulatory Visit: Payer: 59 | Admitting: Physical Therapy

## 2014-05-09 DIAGNOSIS — M25562 Pain in left knee: Secondary | ICD-10-CM | POA: Diagnosis not present

## 2014-05-11 ENCOUNTER — Ambulatory Visit: Payer: 59 | Admitting: Physical Therapy

## 2014-05-11 DIAGNOSIS — M25562 Pain in left knee: Secondary | ICD-10-CM | POA: Diagnosis not present

## 2014-05-16 ENCOUNTER — Ambulatory Visit: Payer: 59 | Admitting: Physical Therapy

## 2014-05-17 ENCOUNTER — Telehealth: Payer: Self-pay

## 2014-05-17 NOTE — Telephone Encounter (Signed)
UDS: 05/02/2014  Negative for Dilaudid  Positive for Xanax Positive for Gabapentin Positive for Oxycodone Positive for Citalopram  Low risk per Dr. Larose Kells 05/16/2014

## 2014-05-17 NOTE — Telephone Encounter (Signed)
LMOM for Pt to return call. Calling Pt to inform him UDS urine results considered low risk by Dr. Larose Kells.

## 2014-05-22 ENCOUNTER — Other Ambulatory Visit: Payer: Self-pay

## 2014-05-22 ENCOUNTER — Telehealth: Payer: Self-pay | Admitting: Internal Medicine

## 2014-05-22 MED ORDER — ESCITALOPRAM OXALATE 20 MG PO TABS: 20.0000 mg | ORAL_TABLET | Freq: Every day | ORAL | Status: DC

## 2014-05-22 NOTE — Telephone Encounter (Signed)
[facility]

## 2014-05-22 NOTE — Telephone Encounter (Signed)
Caller name: rogerick Relation to pt: self Call back number: [phone] Pharmacy: [address]  Reason for call:   Patient states that he is out of escitalopram. Would like a refill.

## 2014-05-23 ENCOUNTER — Ambulatory Visit: Payer: 59 | Attending: Orthopedic Surgery | Admitting: Physical Therapy

## 2014-05-23 DIAGNOSIS — M25662 Stiffness of left knee, not elsewhere classified: Secondary | ICD-10-CM | POA: Diagnosis not present

## 2014-05-23 DIAGNOSIS — R262 Difficulty in walking, not elsewhere classified: Secondary | ICD-10-CM | POA: Insufficient documentation

## 2014-05-23 DIAGNOSIS — R609 Edema, unspecified: Secondary | ICD-10-CM | POA: Insufficient documentation

## 2014-05-23 DIAGNOSIS — G629 Polyneuropathy, unspecified: Secondary | ICD-10-CM | POA: Diagnosis not present

## 2014-05-23 DIAGNOSIS — I10 Essential (primary) hypertension: Secondary | ICD-10-CM | POA: Diagnosis not present

## 2014-05-23 DIAGNOSIS — M810 Age-related osteoporosis without current pathological fracture: Secondary | ICD-10-CM | POA: Insufficient documentation

## 2014-05-23 DIAGNOSIS — Z96652 Presence of left artificial knee joint: Secondary | ICD-10-CM | POA: Insufficient documentation

## 2014-05-23 DIAGNOSIS — M25562 Pain in left knee: Secondary | ICD-10-CM | POA: Diagnosis not present

## 2014-05-24 ENCOUNTER — Encounter: Payer: Self-pay | Admitting: Internal Medicine

## 2014-05-26 ENCOUNTER — Ambulatory Visit: Payer: 59 | Admitting: Physical Therapy

## 2014-05-26 DIAGNOSIS — M25562 Pain in left knee: Secondary | ICD-10-CM | POA: Diagnosis not present

## 2014-05-29 ENCOUNTER — Other Ambulatory Visit: Payer: Self-pay | Admitting: Internal Medicine

## 2014-05-30 ENCOUNTER — Ambulatory Visit: Payer: 59 | Admitting: Physical Therapy

## 2014-05-30 DIAGNOSIS — M25562 Pain in left knee: Secondary | ICD-10-CM | POA: Diagnosis not present

## 2014-06-01 ENCOUNTER — Ambulatory Visit: Payer: 59 | Admitting: Physical Therapy

## 2014-06-01 ENCOUNTER — Telehealth: Payer: Self-pay

## 2014-06-01 MED ORDER — ALPRAZOLAM 0.5 MG PO TABS: 0.5000 mg | ORAL_TABLET | Freq: Every day | ORAL | Status: DC

## 2014-06-01 NOTE — Telephone Encounter (Signed)
Faxed to [facility].

## 2014-06-01 NOTE — Telephone Encounter (Signed)
Pt is requesting refill on Alprazolam.   Last OV: 02/06/2014 Last Fill: 04/24/2014 # 30 0RF UDS: 05/02/2014 Low risk  Please advise.

## 2014-06-01 NOTE — Telephone Encounter (Signed)
done

## 2014-06-06 ENCOUNTER — Encounter: Payer: Self-pay | Admitting: Internal Medicine

## 2014-06-06 ENCOUNTER — Other Ambulatory Visit: Payer: Self-pay

## 2014-06-08 ENCOUNTER — Other Ambulatory Visit: Payer: Self-pay | Admitting: Internal Medicine

## 2014-06-21 ENCOUNTER — Other Ambulatory Visit: Payer: Self-pay | Admitting: *Deleted

## 2014-06-21 MED ORDER — ESCITALOPRAM OXALATE 20 MG PO TABS: 20.0000 mg | ORAL_TABLET | Freq: Every day | ORAL | Status: DC

## 2014-06-21 NOTE — Telephone Encounter (Signed)
Escitalopram 20 mg refilled per protocol. E-scribed to [facility]. JG//CMA

## 2014-06-27 ENCOUNTER — Other Ambulatory Visit: Payer: Self-pay

## 2014-06-27 ENCOUNTER — Telehealth: Payer: Self-pay

## 2014-06-27 MED ORDER — ESCITALOPRAM OXALATE 20 MG PO TABS: 20.0000 mg | ORAL_TABLET | Freq: Every day | ORAL | Status: DC

## 2014-06-27 NOTE — Telephone Encounter (Signed)
[facility] until he receives supply from [place].

## 2014-06-27 NOTE — Telephone Encounter (Signed)
Robert Gibbs [phone] [facility] [address]  [name] needs a Orthoptist script for [facility] pills [facility], to get him by, because he is completely out and he will not receive the ones from [facility] for at least 7-10 days, and [facility] over road so he could have 7 [facility].

## 2014-07-05 ENCOUNTER — Encounter: Payer: Self-pay | Admitting: Internal Medicine

## 2014-07-05 ENCOUNTER — Ambulatory Visit (INDEPENDENT_AMBULATORY_CARE_PROVIDER_SITE_OTHER): Payer: 59 | Admitting: Internal Medicine

## 2014-07-05 VITALS — BP 138/80 | HR 75 | Temp 97.6°F | Ht 70.0 in | Wt 288.2 lb

## 2014-07-05 DIAGNOSIS — F419 Anxiety disorder, unspecified: Secondary | ICD-10-CM

## 2014-07-05 DIAGNOSIS — I10 Essential (primary) hypertension: Secondary | ICD-10-CM

## 2014-07-05 DIAGNOSIS — F32A Depression, unspecified: Secondary | ICD-10-CM

## 2014-07-05 DIAGNOSIS — M159 Polyosteoarthritis, unspecified: Secondary | ICD-10-CM

## 2014-07-05 DIAGNOSIS — M150 Primary generalized (osteo)arthritis: Secondary | ICD-10-CM

## 2014-07-05 DIAGNOSIS — F418 Other specified anxiety disorders: Secondary | ICD-10-CM

## 2014-07-05 DIAGNOSIS — F329 Major depressive disorder, single episode, unspecified: Secondary | ICD-10-CM

## 2014-07-05 MED ORDER — ESCITALOPRAM OXALATE 20 MG PO TABS: 20.0000 mg | ORAL_TABLET | Freq: Every day | ORAL | Status: DC

## 2014-07-05 MED ORDER — LISINOPRIL 40 MG PO TABS: 40.0000 mg | ORAL_TABLET | Freq: Every day | ORAL | Status: DC

## 2014-07-05 MED ORDER — METOPROLOL SUCCINATE ER 100 MG PO TB24: ORAL_TABLET | ORAL | Status: DC

## 2014-07-05 MED ORDER — BUPROPION HCL ER (XL) 300 MG PO TB24: ORAL_TABLET | ORAL | Status: DC

## 2014-07-05 NOTE — Progress Notes (Signed)
Subjective:    Patient ID: Robert Gibbs, male    DOB: 06/19/1951, 64 y.o.   MRN: [id number]  DOS:  07/05/2014 Type of visit - description : Routine office visit Interval history: In general feeling well. Good medication compliance. Lexapro working very well for him. No recent ambulatory BPs Not taking Lasix regularly but edema is well-controlled  ROS  denies chest pain, difficulty breathing No nausea, vomiting, diarrhea   Past Medical History  Diagnosis Date  . GERD (gastroesophageal reflux disease) 03/18/2012  . HTN (hypertension)   . Anxiety and depression   . Vitamin D deficiency   . Spinal stenosis 06-2012    saw neuro w/ "neuropathy", dx w/ spinal stenosis  . Hyperlipidemia   . Erectile dysfunction   . Varicose veins   . CTS (carpal tunnel syndrome)     h/o  . Barrett esophagus   . Wears glasses   . OSA (obstructive sleep apnea) 03/18/2012    on BiPap  . H/O hiatal hernia   . Headache(784.0)     sinus  . Arthritis   . BPH (benign prostatic hyperplasia)   . Edema of both legs     legs , ankles and feet  . Anxiety   . Depression     Past Surgical History  Procedure Laterality Date  . Elbow surgery  1992    for tennis elbow-rt  . Foot surgery      R, for plantar fasciitis  . Prostate biopsy      2010, (-)  . Back surgery  2002    L4-L5  . Hernia repair      x 2 as a child-ing   . Uvulopalatopharyngoplasty (uppp)/tonsillectomy/septoplasty  1990  . Colonoscopy    . Upper gastrointestinal endoscopy    . Arch bar removal    . Ligation / division saphenous vein      both legs  . Knee arthroscopy with meniscal repair Bilateral 05/04/2013    Procedure: BILATERAL KNEE ARTHROSCOPY WITH POSSIBLE CHONDROPLASTY AND MENISCECTOMY ;  Surgeon: Kerin Salen, MD;  Location: [place];  Service: Orthopedics;  Laterality: Bilateral;  Right chrondroplasty, debridement, , partial medial menisectomy, removal of loose bodies.   . Tonsillectomy    . Eye  surgery Right     cataract with lens implant  . 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 Total knee arthroplasty Right 11/07/2013    Procedure: TOTAL KNEE ARTHROPLASTY;  Surgeon: Kerin Salen, MD;  Location: [place];  Service: Orthopedics;  Laterality: Right;  . Total knee arthroplasty Left 03/01/2014    Dr Mayer Camel  . Total knee arthroplasty Left 03/01/2014    Procedure: TOTAL KNEE ARTHROPLASTY;  Surgeon: Kerin Salen, MD;  Location: [place];  Service: Orthopedics;  Laterality: Left;    History   Social History  . Marital Status: Divorced    Spouse Name: N/A    Number of Children: 1  . Years of Education: N/A   Occupational History  . [employer]    Social History Main Topics  . Smoking status: Former Smoker -- 1.00 packs/day for 40 years    Quit date: 06/29/2008  . Smokeless tobacco: Never Used     Comment: quit 2010  . Alcohol Use: 1.5 oz/week    3 drink(s) per week     Comment: 2-3 large drinks, Vodka, 2-3 times a week  . Drug Use: No  . Sexual Activity: Not on file  Other Topics Concern  . Not on file   Social History Narrative   Lives w/  fiance                 Medication List       This list is accurate as of: 07/05/14 11:59 PM.  Always use your most recent med list.               ALPRAZolam 0.5 MG tablet  Commonly known as:  XANAX  Take 1 tablet (0.5 mg total) by mouth at bedtime.     aspirin 81 MG tablet  Take 81 mg by mouth daily.     b complex vitamins capsule  Take 1 capsule by mouth daily.     buPROPion 300 MG 24 hr tablet  Commonly known as:  WELLBUTRIN XL  Take 1 tablet daily.     CALCIUM 1200 1200-1000 MG-UNIT Chew  Chew 1 tablet by mouth daily.     Coenzyme Q10 200 MG capsule  Take 200 mg by mouth daily.     escitalopram 20 MG tablet  Commonly known as:  LEXAPRO  Take 1 tablet (20 mg total) by mouth daily.     FLEX OMEGA BENEFITS/VIT D-3 PO  Take 2 capsules by mouth daily.     furosemide 20 MG tablet  Commonly known  as:  LASIX  Take 20 mg by mouth daily as needed for edema.     gabapentin 800 MG tablet  Commonly known as:  NEURONTIN  TAKE 1 TABLET FOUR TIMES A DAY     lisinopril 40 MG tablet  Commonly known as:  PRINIVIL,ZESTRIL  Take 1 tablet (40 mg total) by mouth daily.     magnesium oxide 400 MG tablet  Commonly known as:  MAG-OX  Take 400 mg by mouth daily.     Melatonin 5 MG Caps  Take 10 mg by mouth at bedtime. Patient stated that he is taking 10 mg daily     metoprolol succinate 100 MG 24 hr tablet  Commonly known as:  TOPROL-XL  Take 1 tablet daily with or immediately following a meal.     multivitamin with minerals tablet  Take 1 tablet by mouth daily.     NASACORT ALLERGY 24HR 55 MCG/ACT Aero nasal inhaler  Generic drug:  triamcinolone  Place 2 sprays into the nose at bedtime.     omeprazole 40 MG capsule  Commonly known as:  PRILOSEC  Take 40 mg by mouth 2 (two) times daily.     vitamin C 1000 MG tablet  Take 1,000 mg by mouth daily.           Objective:   Physical Exam BP 138/80 mmHg  Pulse 75  Temp(Src) 97.6 F (36.4 C) (Oral)  Ht 5\' 10"  (1.778 m)  Wt 288 lb 4 oz (130.749 kg)  BMI 41.36 kg/m2  SpO2 92%  BP elevated upon arrival to the office, BP recheck in both arms: 138/80 General -- alert, well-developed, NAD.   Lungs -- normal respiratory effort, no intercostal retractions, no accessory muscle use, and normal breath sounds.  Heart-- normal rate, regular rhythm, no murmur.  Extremities-- no pretibial edema bilaterally  Neurologic--  alert & oriented X3. Speech normal, gait appropriate for age, strength symmetric and appropriate for age.  Psych-- Cognition and judgment appear intact. Cooperative with normal attention span and concentration. No anxious or depressed appearing.        Assessment & Plan:

## 2014-07-05 NOTE — Patient Instructions (Signed)
Check the  blood pressure   weekly  Be sure your blood pressure is between 110/65 and  145/85.  if it is consistently higher or lower, let me know  Please make an appointment for May 2016 for a physical exam, fasting

## 2014-07-06 NOTE — Assessment & Plan Note (Signed)
Anxiety, depression, well-controlled, refill Lexapro bupropion.

## 2014-07-06 NOTE — Assessment & Plan Note (Signed)
BP elevated upon arrival to the office but it was rechecked and normal. Well controlled. Refill lisinopril and metoprolol

## 2014-07-06 NOTE — Assessment & Plan Note (Signed)
S/p both knee  Replacement (at different times) in 2015, doing well. PT  ended, planning to go to gym

## 2014-07-12 ENCOUNTER — Other Ambulatory Visit: Payer: Self-pay

## 2014-07-12 MED ORDER — OMEPRAZOLE 40 MG PO CPDR: 40.0000 mg | DELAYED_RELEASE_CAPSULE | Freq: Two times a day (BID) | ORAL | Status: DC

## 2014-07-13 ENCOUNTER — Telehealth: Payer: Self-pay | Admitting: Internal Medicine

## 2014-07-13 NOTE — Telephone Encounter (Signed)
Caller name: daschel Relation to pt: self Call back number:  [phone] Pharmacy: [facility]  Reason for call:   Patient states that omeprazole rx that was sent yesterday was supposed to say "Dr. Ephriam Jenkins" formula

## 2014-07-17 ENCOUNTER — Other Ambulatory Visit: Payer: Self-pay

## 2014-07-17 MED ORDER — OMEPRAZOLE 40 MG PO CPDR: 40.0000 mg | DELAYED_RELEASE_CAPSULE | Freq: Two times a day (BID) | ORAL | Status: DC

## 2014-07-17 NOTE — Telephone Encounter (Signed)
Omeprazole resent to [facility] with "Dr. Ephriam Jenkins formula."

## 2014-08-18 ENCOUNTER — Telehealth: Payer: Self-pay | Admitting: Internal Medicine

## 2014-08-18 MED ORDER — ALPRAZOLAM 0.5 MG PO TABS: 0.5000 mg | ORAL_TABLET | Freq: Every day | ORAL | Status: DC

## 2014-08-18 NOTE — Telephone Encounter (Signed)
Pt is requesting refill on Alprazolam. Requesting 90 day supply if possible.  Last OV: 07/05/2014  Last Fill: 06/01/2014 # 30 1 RF UDS: 05/02/2014 Low risk  Please advise.

## 2014-08-18 NOTE — Telephone Encounter (Signed)
Rx printed, awaiting signature by Dr. Paz.  

## 2014-08-18 NOTE — Telephone Encounter (Signed)
Please print 90 and 1 refill

## 2014-08-18 NOTE — Telephone Encounter (Signed)
[facility]

## 2014-08-18 NOTE — Telephone Encounter (Signed)
Caller name:Trase Maxham Relationship to patient:Self Can be reached:cell # [phone] [facility]  Reason for call: PT requesting refill of ALPRAZolam (XANAX) 0.5 MG tablet [[id number]] - asking for 90 day supply. ( 30 days fine if not) [facility]

## 2014-10-16 ENCOUNTER — Telehealth: Payer: Self-pay | Admitting: Internal Medicine

## 2014-10-16 NOTE — Telephone Encounter (Signed)
Pre visit letter for annual exam mailed

## 2014-10-24 ENCOUNTER — Telehealth: Payer: Self-pay

## 2014-10-24 MED ORDER — ALPRAZOLAM 0.5 MG PO TABS: 0.5000 mg | ORAL_TABLET | Freq: Every day | ORAL | Status: DC

## 2014-10-24 NOTE — Telephone Encounter (Signed)
It is early, next RF should be by 01-2015, please find out if he maybe transfer the prescription to another pharmacy, if that is the case, proceed with #90 no refills

## 2014-10-24 NOTE — Telephone Encounter (Signed)
Pt is requesting refill on Alprazolam.  Last OV: 07/05/2014 Last Fill: 08/18/2014 # 37 1RF UDS: 05/02/2014 Low risk  Please advise.

## 2014-10-24 NOTE — Telephone Encounter (Signed)
Rx faxed to [facility].

## 2014-10-24 NOTE — Telephone Encounter (Signed)
Pt is changing Alprazolam Rx to [facility], [facility] needing new Rx.

## 2014-10-24 NOTE — Telephone Encounter (Signed)
Rx printed, awaiting MD signature.  

## 2014-10-30 ENCOUNTER — Telehealth: Payer: Self-pay | Admitting: Internal Medicine

## 2014-10-30 MED ORDER — OMEPRAZOLE 40 MG PO CPDR: 40.0000 mg | DELAYED_RELEASE_CAPSULE | Freq: Two times a day (BID) | ORAL | Status: DC

## 2014-10-30 NOTE — Telephone Encounter (Signed)
Caller: Robert Gibbs, self Ph#: [phone] Pharmacy: [facility]  Reason: Pt states [facility] has not received RX for omeprazole (PRILOSEC) 40 MG capsule. He states that it must be brand name "Dr. Reddy's" and asked that we note/flag this in his chart. The other brands do not work for him.

## 2014-10-30 NOTE — Telephone Encounter (Signed)
Omeprazole already noted stating ONLY DR. REDDY'S FORMULA. Omeprazole resent to [facility].

## 2014-11-03 ENCOUNTER — Encounter: Payer: Self-pay | Admitting: *Deleted

## 2014-11-03 ENCOUNTER — Telehealth: Payer: Self-pay | Admitting: *Deleted

## 2014-11-03 NOTE — Telephone Encounter (Signed)
Pre-Visit Call completed with patient and chart updated.   Pre-Visit Info documented in Specialty Comments under [employer].    

## 2014-11-06 ENCOUNTER — Encounter: Payer: 59 | Admitting: Internal Medicine

## 2014-11-23 ENCOUNTER — Telehealth: Payer: Self-pay | Admitting: Internal Medicine

## 2014-11-23 ENCOUNTER — Encounter: Payer: Self-pay | Admitting: Internal Medicine

## 2014-11-23 NOTE — Telephone Encounter (Signed)
No, is okay, just reschedule at his convenience

## 2014-11-23 NOTE — Telephone Encounter (Signed)
Patient was no show for CPE on 11/06/14- left vm morning of stating didn't feel well. We have tried to reach the PT to reschedule. Letter sent, charge the pt?

## 2014-12-18 ENCOUNTER — Encounter: Payer: 59 | Admitting: Internal Medicine

## 2014-12-19 ENCOUNTER — Telehealth: Payer: Self-pay | Admitting: Internal Medicine

## 2014-12-19 ENCOUNTER — Encounter: Payer: Self-pay | Admitting: Internal Medicine

## 2014-12-19 NOTE — Telephone Encounter (Signed)
Pt was no show 12/18/14 9:00am, cpe, 2nd missed cpe, has not rescheduled, mailing letter, charge?

## 2014-12-19 NOTE — Telephone Encounter (Signed)
Please charge  

## 2014-12-19 NOTE — Telephone Encounter (Signed)
FYI

## 2015-01-17 ENCOUNTER — Other Ambulatory Visit: Payer: Self-pay | Admitting: Internal Medicine

## 2015-01-30 ENCOUNTER — Telehealth: Payer: Self-pay | Admitting: Internal Medicine

## 2015-01-30 MED ORDER — ALPRAZOLAM 0.5 MG PO TABS: 0.5000 mg | ORAL_TABLET | Freq: Every day | ORAL | Status: DC

## 2015-01-30 NOTE — Telephone Encounter (Signed)
Rx printed, awaiting MD signature.  

## 2015-01-30 NOTE — Telephone Encounter (Signed)
Pt is requesting refill on Alprazolam. 90 day supply if possible he uses [facility].  Last OV: 07/05/2014, CPE scheduled for 01/31/2015 Last Fill: 10/24/2014 #90 0RF UDS: 05/02/2014 Low risk   Please advise.

## 2015-01-30 NOTE — Telephone Encounter (Signed)
Rx has been faxed to [facility].

## 2015-01-30 NOTE — Telephone Encounter (Signed)
Pharmacy: [facility]   Reason for call: pt needing ALPRAZolam (XANAX) 0.5 MG tablet. He has 2 left. 90 day supply is more cost effective to pt. Please notify if there is an issue. He said he was advised by [facility] to contact Dr. Larose Kells.

## 2015-01-30 NOTE — Telephone Encounter (Signed)
Okay  #90 and 1 refill. (#90 is a three-month supply, he takes 1 alprazolam at bedtime)

## 2015-01-31 ENCOUNTER — Ambulatory Visit (INDEPENDENT_AMBULATORY_CARE_PROVIDER_SITE_OTHER): Payer: 59 | Admitting: Internal Medicine

## 2015-01-31 ENCOUNTER — Encounter: Payer: Self-pay | Admitting: Internal Medicine

## 2015-01-31 VITALS — BP 126/78 | HR 62 | Temp 98.2°F | Ht 70.0 in | Wt 291.0 lb

## 2015-01-31 DIAGNOSIS — K219 Gastro-esophageal reflux disease without esophagitis: Secondary | ICD-10-CM

## 2015-01-31 DIAGNOSIS — Z0000 Encounter for general adult medical examination without abnormal findings: Secondary | ICD-10-CM | POA: Diagnosis not present

## 2015-01-31 DIAGNOSIS — M6208 Separation of muscle (nontraumatic), other site: Secondary | ICD-10-CM

## 2015-01-31 DIAGNOSIS — G629 Polyneuropathy, unspecified: Secondary | ICD-10-CM

## 2015-01-31 MED ORDER — OMEPRAZOLE 40 MG PO CPDR: 40.0000 mg | DELAYED_RELEASE_CAPSULE | Freq: Two times a day (BID) | ORAL | Status: DC

## 2015-01-31 MED ORDER — ALPRAZOLAM 0.5 MG PO TABS: 0.5000 mg | ORAL_TABLET | Freq: Every day | ORAL | Status: DC

## 2015-01-31 NOTE — Progress Notes (Signed)
Subjective:    Patient ID: Robert Gibbs, male    DOB: October 14, 1950, 64 y.o.   MRN: [id number]  DOS:  01/31/2015 Type of visit - description : Complete physical exam Interval history: Good medication compliance, in general feeling well. No apparent side effects.    Review of Systems  Constitutional: No fever. No chills. No unexplained wt changes. No unusual sweats  HEENT: No dental problems, no ear discharge, no facial swelling, no voice changes. No eye discharge, no eye  redness , no  intolerance to light   Respiratory: No wheezing , no  difficulty breathing. No cough , no mucus production  Cardiovascular: No CP, no leg swelling , no  Palpitations  GI: no nausea, no vomiting, no diarrhea , no  abdominal pain, noted a lump of the abdomen and a month ago, hernia? No blood in the stools. No dysphagia, no odynophagia    Endocrine: No polyphagia, no polyuria , no polydipsia  GU: No dysuria, gross hematuria, difficulty urinating. No urinary urgency, no frequency.  Musculoskeletal: No joint swellings or unusual aches or pains  Skin: No change in the color of the skin, palor , no  Rash  Allergic, immunologic: No environmental allergies , no  food allergies  Neurological: No dizziness no  syncope. No headaches. No diplopia, no slurred, no slurred speech, no motor deficits, no facial  Numbness  Hematological: No enlarged lymph nodes, no easy bruising , no unusual bleedings  Psychiatry: No suicidal ideas, no hallucinations, no beavior problems, no confusion.  No unusual/severe anxiety, no depression   Past Medical History  Diagnosis Date  . GERD (gastroesophageal reflux disease) 03/18/2012  . HTN (hypertension)   . Anxiety and depression   . Vitamin D deficiency   . Spinal stenosis 06-2012    saw neuro w/ "neuropathy", dx w/ spinal stenosis  . Hyperlipidemia   . Erectile dysfunction   . Varicose veins   . CTS (carpal tunnel syndrome)     h/o  . Barrett esophagus   .  Wears glasses   . OSA (obstructive sleep apnea) 03/18/2012    on BiPap  . H/O hiatal hernia   . Arthritis   . BPH (benign prostatic hyperplasia)   . Edema of both legs     legs , ankles and feet    Past Surgical History  Procedure Laterality Date  . Elbow surgery  1992    for tennis elbow-rt  . Foot surgery      R, for plantar fasciitis  . Prostate biopsy      2010, (-)  . Back surgery  2002    L4-L5  . Hernia repair      x 2 as a child-ing   . Uvulopalatopharyngoplasty (uppp)/tonsillectomy/septoplasty  1990  . Colonoscopy    . Upper gastrointestinal endoscopy    . Arch bar removal    . Ligation / division saphenous vein      both legs  . Knee arthroscopy with meniscal repair Bilateral 05/04/2013    Procedure: BILATERAL KNEE ARTHROSCOPY WITH POSSIBLE CHONDROPLASTY AND MENISCECTOMY ;  Surgeon: Kerin Salen, MD;  Location: [facility];  Service: Orthopedics;  Laterality: Bilateral;  Right chrondroplasty, debridement, , partial medial menisectomy, removal of loose bodies.   . Tonsillectomy    . Eye surgery Right     cataract with lens implant  . 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 Total knee arthroplasty Right 11/07/2013    Procedure:  TOTAL KNEE ARTHROPLASTY;  Surgeon: Kerin Salen, MD;  Location: [facility];  Service: Orthopedics;  Laterality: Right;  . Total knee arthroplasty Left 03/01/2014    Dr Mayer Camel  . Total knee arthroplasty Left 03/01/2014    Procedure: TOTAL KNEE ARTHROPLASTY;  Surgeon: Kerin Salen, MD;  Location: [facility];  Service: Orthopedics;  Laterality: Left;    Social History   Social History  . Marital Status: Divorced    Spouse Name: N/A  . Number of Children: 1  . Years of Education: N/A   Occupational History  . [employer]    Social History Main Topics  . Smoking status: Former Smoker -- 1.00 packs/day for 40 years    Quit date: 06/29/2008  . Smokeless tobacco: Never Used     Comment: quit 2010  . Alcohol Use: 1.5  oz/week    3 Standard drinks or equivalent per week     Comment: 2-3 large drinks, Vodka, 2-3 times a week  . Drug Use: No  . Sexual Activity: Not on file   Other Topics Concern  . Not on file   Social History Narrative   Lives w/  fiance              Family History  Problem Relation Age of Onset  . Colon cancer Neg Hx   . Prostate cancer Neg Hx   . Diabetes Neg Hx   . Cancer Neg Hx   . CAD Father     ?  Marland Kitchen Alzheimer's disease Mother        Medication List       This list is accurate as of: 01/31/15 11:59 PM.  Always use your most recent med list.               ALPRAZolam 0.5 MG tablet  Commonly known as:  XANAX  Take 1 tablet (0.5 mg total) by mouth at bedtime.     aspirin 81 MG tablet  Take 81 mg by mouth daily.     b complex vitamins capsule  Take 1 capsule by mouth daily.     buPROPion 300 MG 24 hr tablet  Commonly known as:  WELLBUTRIN XL  Take 1 tablet daily.     CALCIUM 1200 1200-1000 MG-UNIT Chew  Chew 1 tablet by mouth daily.     Coenzyme Q10 200 MG capsule  Take 200 mg by mouth daily.     escitalopram 20 MG tablet  Commonly known as:  LEXAPRO  Take 1 tablet (20 mg total) by mouth daily.     FLEX OMEGA BENEFITS/VIT D-3 PO  Take 2 capsules by mouth daily.     furosemide 20 MG tablet  Commonly known as:  LASIX  Take 20 mg by mouth daily as needed for edema.     gabapentin 800 MG tablet  Commonly known as:  NEURONTIN  TAKE 1 TABLET FOUR TIMES A DAY     lisinopril 40 MG tablet  Commonly known as:  PRINIVIL,ZESTRIL  Take 1 tablet (40 mg total) by mouth daily.     magnesium oxide 400 MG tablet  Commonly known as:  MAG-OX  Take 400 mg by mouth daily.     Melatonin 5 MG Caps  Take 10 mg by mouth at bedtime. Patient stated that he is taking 10 mg daily     metoprolol succinate 100 MG 24 hr tablet  Commonly known as:  TOPROL-XL  Take 1 tablet daily with or immediately following a meal.  multivitamin with minerals tablet  Take 1  tablet by mouth daily.     NASACORT ALLERGY 24HR 55 MCG/ACT Aero nasal inhaler  Generic drug:  triamcinolone  Place 2 sprays into the nose at bedtime.     omeprazole 40 MG capsule  Commonly known as:  PRILOSEC  Take 1 capsule (40 mg total) by mouth 2 (two) times daily.     vitamin C 1000 MG tablet  Take 1,000 mg by mouth daily.           Objective:   Physical Exam BP 126/78 mmHg  Pulse 62  Temp(Src) 98.2 F (36.8 C) (Oral)  Ht 5\' 10"  (1.778 m)  Wt 291 lb (131.997 kg)  BMI 41.75 kg/m2  SpO2 96% General:   Well developed, well nourished . NAD.  Neck:  Full range of motion. Supple. No  thyromegaly , normal carotid pulse HEENT:  Normocephalic . Face symmetric, atraumatic Lungs:  CTA B Normal respiratory effort, no intercostal retractions, no accessory muscle use. Heart: RRR,  no murmur.  No pretibial edema bilaterally  Abdomen:  Not distended, soft, non-tender. No rebound or rigidity.  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 Skin: Exposed areas without rash. Not pale. Not jaundice Neurologic:  alert & oriented X3.  Speech normal, gait appropriate for age and unassisted Strength symmetric and appropriate for age.  Psych: Cognition and judgment appear intact.  Cooperative with normal attention span and concentration.  Behavior appropriate. No anxious or depressed appearing.    Assessment & Plan:    Hypertension: Seems well-controlled

## 2015-01-31 NOTE — Telephone Encounter (Signed)
[phone] would like for dr.paz to give them a call states they have questions regarding the pt rx alprazolam, [facility] but they have a rx to fill for 30 days needs clarification if dr. Larose Kells intended to provide the pt with a whole 30 day supply.

## 2015-01-31 NOTE — Assessment & Plan Note (Addendum)
Last EGD 2013, Dr Alice Reichert , refer to GI likely due for a EGD Needs omeprazole only the Dr Robert Gibbs

## 2015-01-31 NOTE — Patient Instructions (Signed)
Get your blood work before you leave    

## 2015-01-31 NOTE — Progress Notes (Signed)
Pre visit review using our clinic review tool, if applicable. No additional management support is needed unless otherwise documented below in the visit note. 

## 2015-01-31 NOTE — Assessment & Plan Note (Addendum)
History of neuropathy, request a prescription for shoes. See comments underspinal stenosis

## 2015-01-31 NOTE — Assessment & Plan Note (Addendum)
immunizations: Tdap 2014 ;  pnm shot 2014 ; prevnar-- 2015 ; zostavax  2014  CCS: cscope 01-2012, bx neg per old records Prostate ca screening: Sees Dr Felipa Eth (urology) regularly ETOH-- not an issue at this time, states drinks in moderation diet-exercise . Extensive discussion, calorie counting, Weight Watchers?

## 2015-02-01 DIAGNOSIS — M6208 Separation of muscle (nontraumatic), other site: Secondary | ICD-10-CM | POA: Insufficient documentation

## 2015-02-01 LAB — LIPID PANEL
Cholesterol: 188 mg/dL (ref 0–200)
HDL: 52.5 mg/dL (ref >39.00)
LDL CALC: 118 mg/dL — AB (ref 0–99)
NONHDL: 135.5
Total CHOL/HDL Ratio: 4
Triglycerides: 87 mg/dL (ref 0.0–149.0)
VLDL: 17.4 mg/dL (ref 0.0–40.0)

## 2015-02-01 LAB — BASIC METABOLIC PANEL
BUN: 17 mg/dL (ref 6–23)
CO2: 27 meq/L (ref 19–32)
CREATININE: 0.87 mg/dL (ref 0.40–1.50)
Calcium: 8.7 mg/dL (ref 8.4–10.5)
Chloride: 101 mEq/L (ref 96–112)
GFR: 93.81 mL/min (ref >60.00)
Glucose, Bld: 100 mg/dL — ABNORMAL HIGH (ref 70–99)
POTASSIUM: 4.2 meq/L (ref 3.5–5.1)
Sodium: 139 mEq/L (ref 135–145)

## 2015-02-01 LAB — CBC WITH DIFFERENTIAL/PLATELET
Basophils Absolute: 0 10*3/uL (ref 0.0–0.1)
Basophils Relative: 0.4 % (ref 0.0–3.0)
Eosinophils Absolute: 0.3 10*3/uL (ref 0.0–0.7)
Eosinophils Relative: 3.9 % (ref 0.0–5.0)
HEMATOCRIT: 41.3 % (ref 39.0–52.0)
Hemoglobin: 14 g/dL (ref 13.0–17.0)
LYMPHS ABS: 2.4 10*3/uL (ref 0.7–4.0)
Lymphocytes Relative: 36.1 % (ref 12.0–46.0)
MCHC: 33.9 g/dL (ref 30.0–36.0)
MCV: 100.1 fl — AB (ref 78.0–100.0)
MONO ABS: 0.7 10*3/uL (ref 0.1–1.0)
MONOS PCT: 9.9 % (ref 3.0–12.0)
NEUTROS ABS: 3.3 10*3/uL (ref 1.4–7.7)
Neutrophils Relative %: 49.7 % (ref 43.0–77.0)
PLATELETS: 216 10*3/uL (ref 150.0–400.0)
RBC: 4.12 Mil/uL — AB (ref 4.22–5.81)
RDW: 14.4 % (ref 11.5–15.5)
WBC: 6.7 10*3/uL (ref 4.0–10.5)

## 2015-02-01 LAB — VITAMIN D 25 HYDROXY (VIT D DEFICIENCY, FRACTURES): VITD: 26.97 ng/mL — AB (ref 30.00–100.00)

## 2015-02-01 LAB — TSH: TSH: 1.25 u[IU]/mL (ref 0.35–4.50)

## 2015-02-01 NOTE — Telephone Encounter (Signed)
[facility] will not speak with [facility], since this is a controlled substance. Must speak with MD directly.

## 2015-02-01 NOTE — Telephone Encounter (Signed)
It is okay to provide a full  30 day supply

## 2015-02-01 NOTE — Assessment & Plan Note (Signed)
Physical exam consistent with diastasis recti, recommend observation, if he has severe pain she needs to let me know

## 2015-02-01 NOTE — Telephone Encounter (Signed)
[facility] needs [facility] to [facility] regarding Pt's Alprazolam prescription since we gave him a 30 day supply for local pharmacy while he is waiting for his mail order prescription.

## 2015-02-02 NOTE — Telephone Encounter (Signed)
Pharmacist states  he is only able to dispense a 7 day supply. Noted

## 2015-02-05 MED ORDER — VITAMIN D (ERGOCALCIFEROL) 1.25 MG (50000 UNIT) PO CAPS: 50000.0000 [IU] | ORAL_CAPSULE | ORAL | Status: DC

## 2015-02-05 NOTE — Addendum Note (Signed)
Addended by: Wilfrid Lund on: 02/05/2015 08:54 AM   Modules accepted: Orders

## 2015-02-14 ENCOUNTER — Other Ambulatory Visit: Payer: Self-pay | Admitting: Internal Medicine

## 2015-02-19 ENCOUNTER — Telehealth: Payer: Self-pay

## 2015-02-19 NOTE — Telephone Encounter (Signed)
Called to discuss UDS report.  Left a message for call back.

## 2015-02-19 NOTE — Telephone Encounter (Signed)
Pt returned call.   UDS report reviewed with patient. Pt was very pleasant.  He states that he is not sure why other meds showed up in his urine.  States that he does take xanax at bedtime for sleep only because he doesn't sleep well and hasn't since he was little boy.  He also says that he takes gabapentin for neuropathy.  All other meds, pt denies taking.  Med list on chart reviewed with patient.  Pt states list was accurate.  He asked that Dr. Larose Kells reconsider the refill on xanax.  Stating that he does not abuse it and only uses it to help him sleep.     Please advise.

## 2015-02-19 NOTE — Telephone Encounter (Signed)
I'll be okay prescribing Ambien 10 mg half or one tablet by mouth daily at bedtime #30 no refills

## 2015-02-20 MED ORDER — ZOLPIDEM TARTRATE 10 MG PO TABS: ORAL_TABLET | ORAL | Status: DC

## 2015-02-20 NOTE — Telephone Encounter (Signed)
Left a message for call back.  

## 2015-02-20 NOTE — Telephone Encounter (Signed)
Pt agrees to Ambien prescription.  Rx printed and placed in Dr. Larose Kells red folder for review and signature.

## 2015-02-21 NOTE — Telephone Encounter (Signed)
Rx signed by provider and faxed to [facility] per patient's request.  Fax confirmation received.

## 2015-04-23 ENCOUNTER — Other Ambulatory Visit: Payer: Self-pay | Admitting: Internal Medicine

## 2015-04-24 ENCOUNTER — Ambulatory Visit (INDEPENDENT_AMBULATORY_CARE_PROVIDER_SITE_OTHER): Payer: 59 | Admitting: Behavioral Health

## 2015-04-24 DIAGNOSIS — Z23 Encounter for immunization: Secondary | ICD-10-CM | POA: Diagnosis not present

## 2015-04-24 HISTORY — PX: ESOPHAGOGASTRODUODENOSCOPY: SHX1529

## 2015-04-24 NOTE — Progress Notes (Signed)
Pre visit review using our clinic review tool, if applicable. No additional management support is needed unless otherwise documented below in the visit note. 

## 2015-04-25 ENCOUNTER — Encounter: Payer: Self-pay | Admitting: Internal Medicine

## 2015-05-15 IMAGING — US US EXTREM LOW VENOUS*L*
1 series · 13 of 24 positions shown · non-contrast
Comparison: None.

CLINICAL DATA: Red and swollen left lower extremity after total
knee replacement 1 week ago. Symptoms for 5 days.



[Series 1: us extrem low venous*left* · 0.09mm/px · 24 acquisitions, 13 frames shown]
[im 1/24]
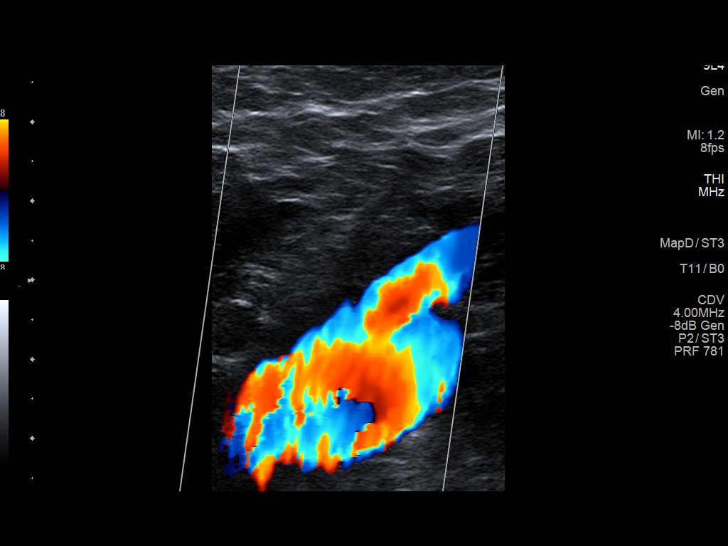
[im 3/24]
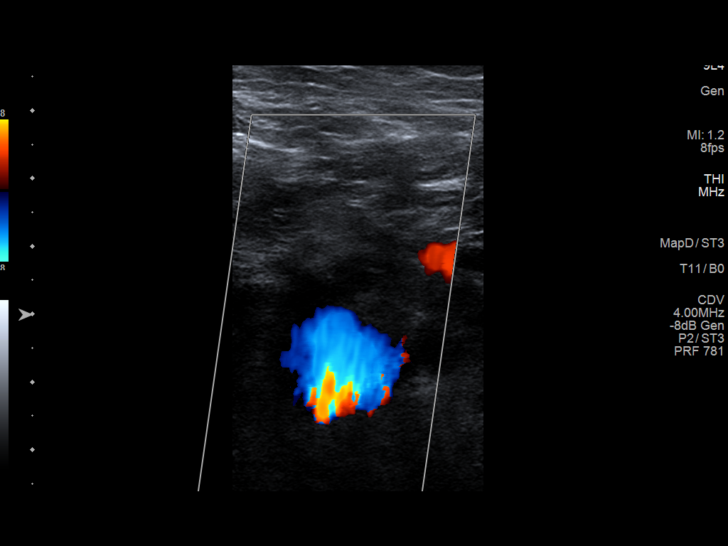
[im 5/24]
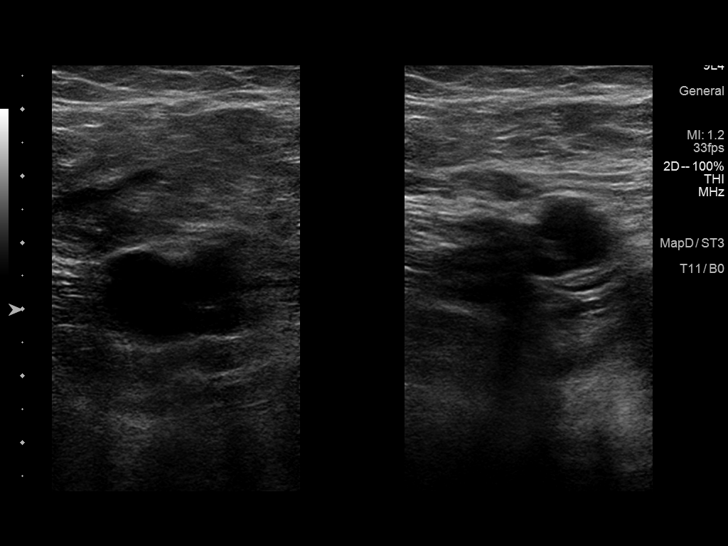
[im 8/24]
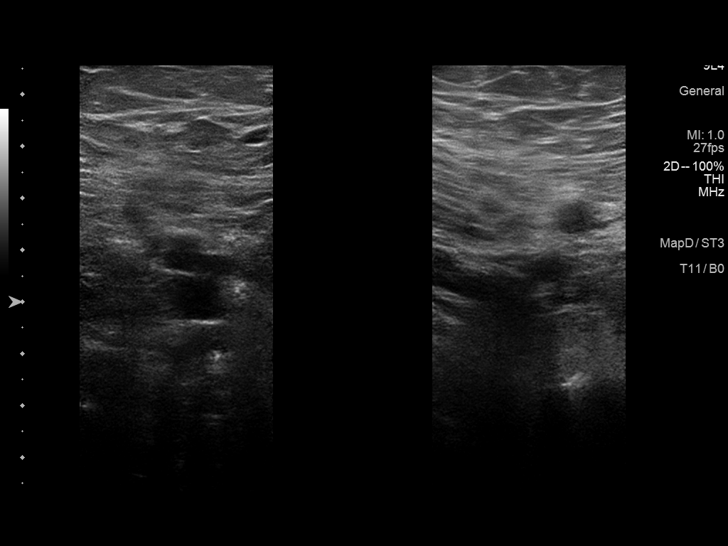
[im 10/24]
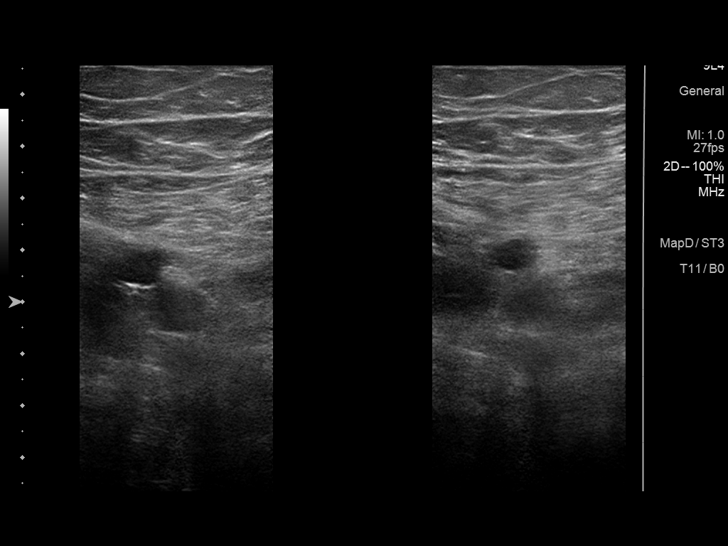
[im 12/24]
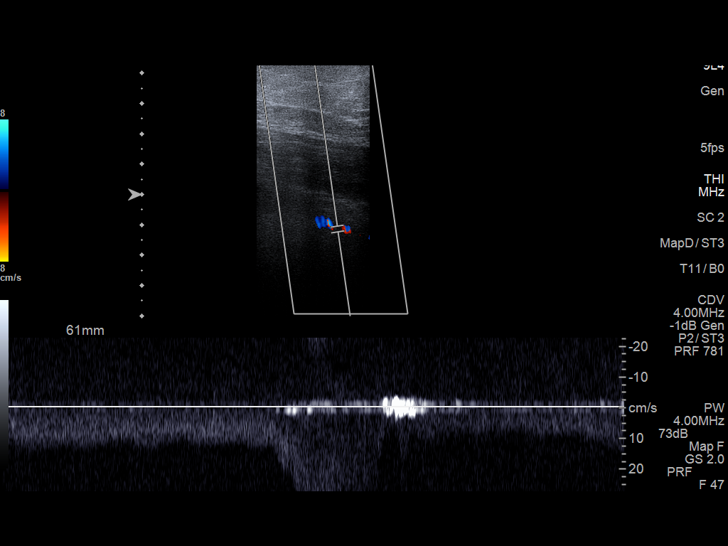
[im 14/24]
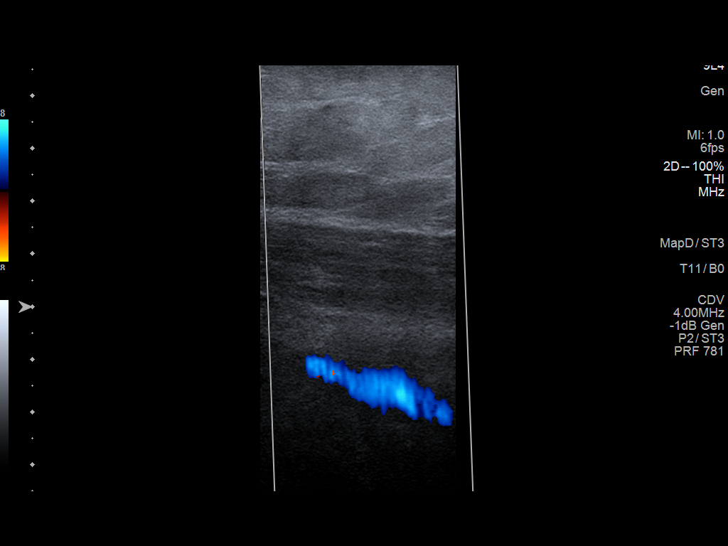
[im 15/24]
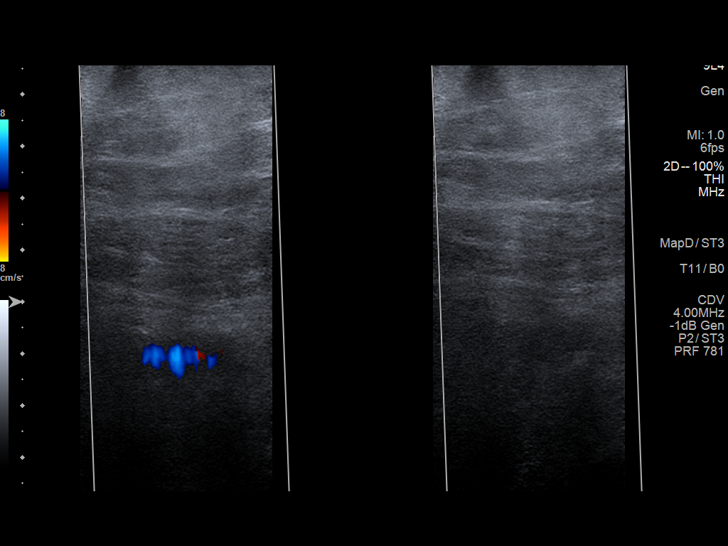
[im 17/24]
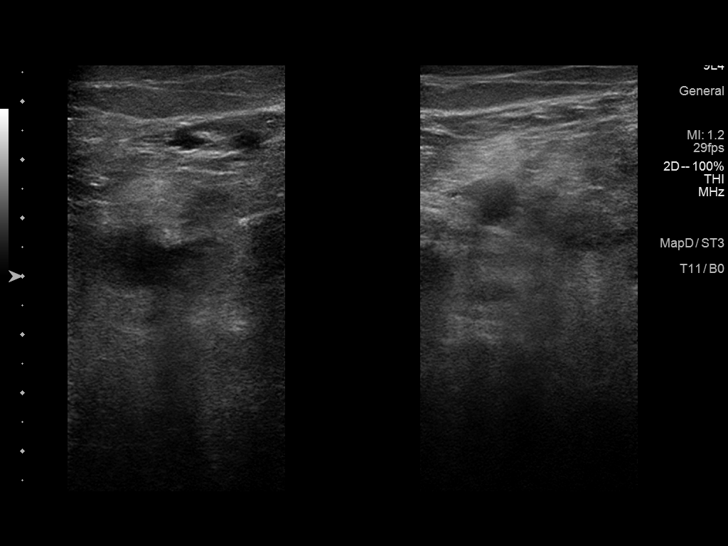
[im 19/24]
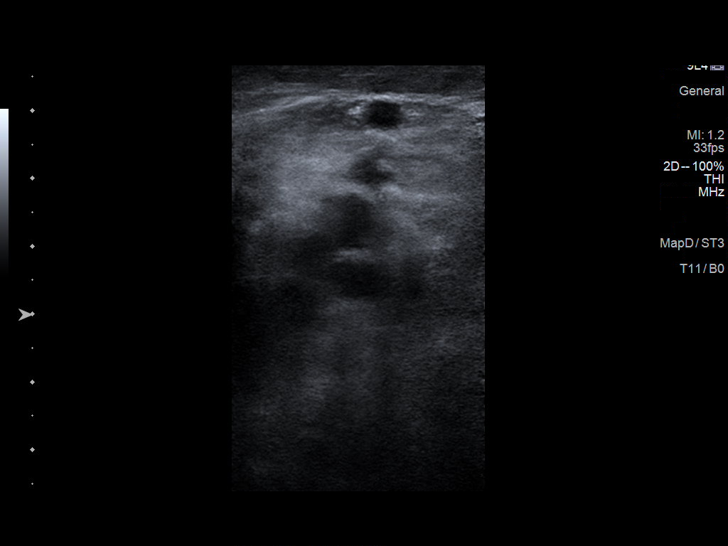
[im 21/24]
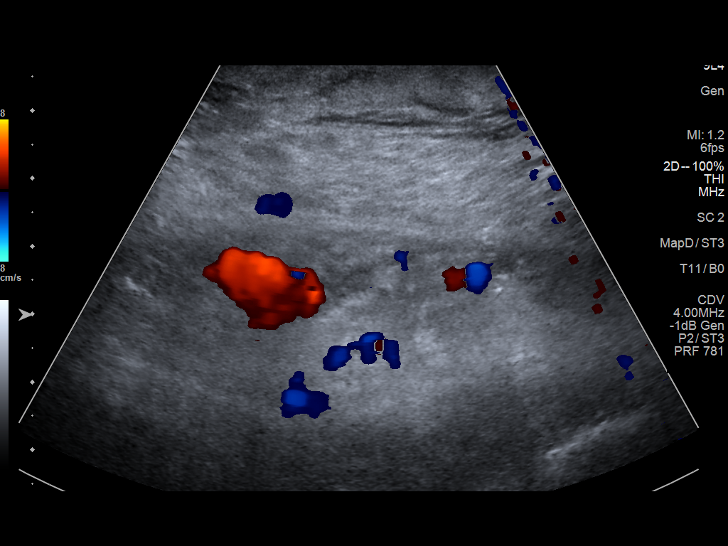
[im 22/24]
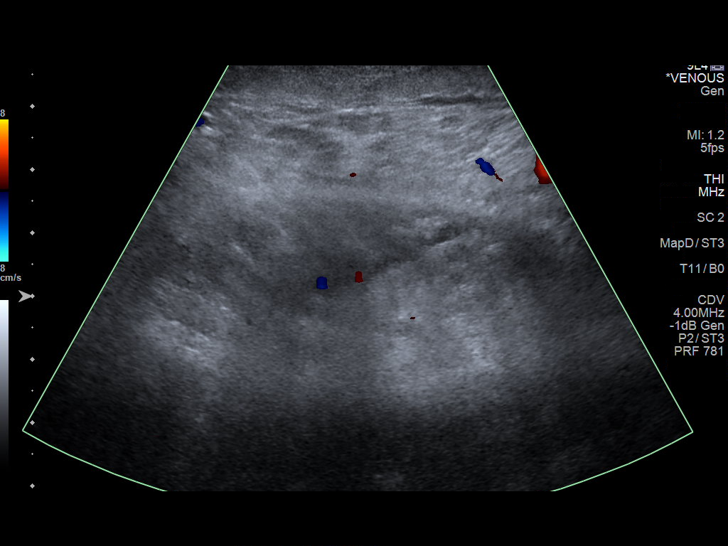
[im 24/24]
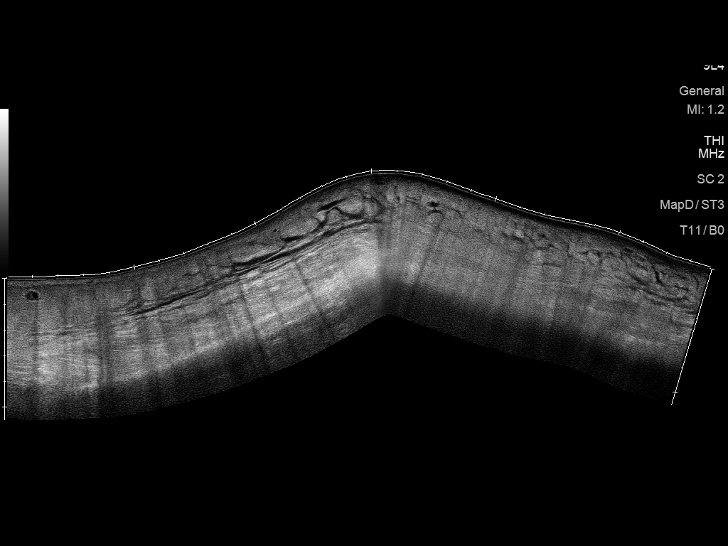

[13 of 24 positions shown; findings below may reference images not displayed]

FINDINGS: Common Femoral Vein: No evidence of thrombus. Normal
compressibility, respiratory phasicity and response to augmentation.

Saphenofemoral Junction: No evidence of thrombus. Normal
compressibility and flow on color Doppler imaging.

Profunda Femoral Vein: No evidence of thrombus. Normal
compressibility and flow on color Doppler imaging.

Femoral Vein: No evidence of thrombus. Normal compressibility,
respiratory phasicity and response to augmentation.

Popliteal Vein: No evidence of thrombus. Normal compressibility,
respiratory phasicity and response to augmentation.

Calf Veins: No evidence of thrombus. Normal compressibility and flow
on color Doppler imaging.

Superficial Great Saphenous Vein: No evidence of thrombus. Normal
compressibility and flow on color Doppler imaging.

Venous Reflux:  None.

Other Findings:  There is significant edema in the calf.
IMPRESSION: 1. No evidence for occlusive deep venous thrombosis.
2. Significant calf edema.

## 2015-06-08 ENCOUNTER — Telehealth: Payer: Self-pay | Admitting: *Deleted

## 2015-06-08 NOTE — Telephone Encounter (Signed)
Received Medical Records request from [facility] signed by patient for PSA, faxed last in EMR [2013 from [facility] records received 2014]/SLS

## 2015-06-13 ENCOUNTER — Ambulatory Visit (INDEPENDENT_AMBULATORY_CARE_PROVIDER_SITE_OTHER): Payer: 59 | Admitting: Internal Medicine

## 2015-06-13 ENCOUNTER — Encounter: Payer: Self-pay | Admitting: Internal Medicine

## 2015-06-13 ENCOUNTER — Other Ambulatory Visit: Payer: Self-pay | Admitting: Internal Medicine

## 2015-06-13 VITALS — BP 132/76 | HR 87 | Temp 98.8°F | Ht 70.0 in | Wt 298.1 lb

## 2015-06-13 DIAGNOSIS — N3001 Acute cystitis with hematuria: Secondary | ICD-10-CM | POA: Diagnosis not present

## 2015-06-13 DIAGNOSIS — R319 Hematuria, unspecified: Secondary | ICD-10-CM

## 2015-06-13 DIAGNOSIS — N390 Urinary tract infection, site not specified: Secondary | ICD-10-CM | POA: Diagnosis not present

## 2015-06-13 DIAGNOSIS — Z09 Encounter for follow-up examination after completed treatment for conditions other than malignant neoplasm: Secondary | ICD-10-CM | POA: Insufficient documentation

## 2015-06-13 LAB — URINALYSIS, ROUTINE W REFLEX MICROSCOPIC
Ketones, ur: 15 — AB
Nitrite: POSITIVE — AB
Specific Gravity, Urine: 1.02 (ref 1.000–1.030)
Total Protein, Urine: 100 — AB
URINE GLUCOSE: NEGATIVE
Urobilinogen, UA: 0.2 (ref 0.0–1.0)
pH: 6 (ref 5.0–8.0)

## 2015-06-13 MED ORDER — TAMSULOSIN HCL 0.4 MG PO CAPS: 0.4000 mg | ORAL_CAPSULE | Freq: Every day | ORAL | Status: DC

## 2015-06-13 MED ORDER — SULFAMETHOXAZOLE-TRIMETHOPRIM 800-160 MG PO TABS: 1.0000 | ORAL_TABLET | Freq: Two times a day (BID) | ORAL | Status: DC

## 2015-06-13 NOTE — Assessment & Plan Note (Signed)
UTI: udip c/w infection. Dx UTI vs UTI and prostatitis, declined a DRE . Will get a UA-UCX-PSA, treat w/ bactrim-flomax. If PSA >> baseline, treat as prostatitis w/ a prolonged abx therapy RTC as schedule ~ 07-2014

## 2015-06-13 NOTE — Progress Notes (Signed)
Pre visit review using our clinic review tool, if applicable. No additional management support is needed unless otherwise documented below in the visit note. 

## 2015-06-13 NOTE — Telephone Encounter (Signed)
Acute visit at 1400. Awaiting appt for refills.

## 2015-06-13 NOTE — Telephone Encounter (Signed)
Refill done patient seen today.

## 2015-06-13 NOTE — Progress Notes (Signed)
Subjective:    Patient ID: Robert Gibbs, male    DOB: 1950/10/17, 64 y.o.   MRN: [id number]  DOS:  06/13/2015 Type of visit - description :  Acute visit Interval history: symptoms started 3 days ago with dysuria,  Mild gross hematuria He also got  achy , had  mild diarrhea , some chills  + urinary freq, some diff urinating  Saw urology last week, PSA was slt above previous one but ok.  No DRE done, no instrumentation. Had no sx at that time   Review of Systems + subjective fever No nausea, vomiting No flank or abd pain No penile d/c-rash. Not sexually active in a while  Past Medical History  Diagnosis Date  . GERD (gastroesophageal reflux disease) 03/18/2012  . HTN (hypertension)   . Anxiety and depression   . Vitamin D deficiency   . Spinal stenosis 06-2012    saw neuro w/ "neuropathy", dx w/ spinal stenosis  . Hyperlipidemia   . Erectile dysfunction   . Varicose veins   . CTS (carpal tunnel syndrome)     h/o  . Barrett esophagus   . Wears glasses   . OSA (obstructive sleep apnea) 03/18/2012    on BiPap  . H/O hiatal hernia   . Arthritis   . BPH (benign prostatic hyperplasia)   . Edema of both legs     legs , ankles and feet    Past Surgical History  Procedure Laterality Date  . Elbow surgery  1992    for tennis elbow-rt  . Foot surgery      R, for plantar fasciitis  . Prostate biopsy      2010, (-)  . Back surgery  2002    L4-L5  . Hernia repair      x 2 as a child-ing   . Uvulopalatopharyngoplasty (uppp)/tonsillectomy/septoplasty  1990  . Colonoscopy    . Upper gastrointestinal endoscopy    . Arch bar removal    . Ligation / division saphenous vein      both legs  . Knee arthroscopy with meniscal repair Bilateral 05/04/2013    Procedure: BILATERAL KNEE ARTHROSCOPY WITH POSSIBLE CHONDROPLASTY AND MENISCECTOMY ;  Surgeon: Kerin Salen, MD;  Location: [facility];  Service: Orthopedics;  Laterality: Bilateral;  Right chrondroplasty,  debridement, , partial medial menisectomy, removal of loose bodies.   . Tonsillectomy    . Eye surgery Right     cataract with lens implant  . 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 Total knee arthroplasty Right 11/07/2013    Procedure: TOTAL KNEE ARTHROPLASTY;  Surgeon: Kerin Salen, MD;  Location: [facility];  Service: Orthopedics;  Laterality: Right;  . Total knee arthroplasty Left 03/01/2014    Dr Mayer Camel  . Total knee arthroplasty Left 03/01/2014    Procedure: TOTAL KNEE ARTHROPLASTY;  Surgeon: Kerin Salen, MD;  Location: [facility];  Service: Orthopedics;  Laterality: Left;  . Esophagogastroduodenoscopy  04/2015    w/ biopsy, short tongue of Barrett's without obvious nodularity, mass or esophagitis seen    Social History   Social History  . Marital Status: Divorced    Spouse Name: N/A  . Number of Children: 1  . Years of Education: N/A   Occupational History  . [employer]    Social History Main Topics  . Smoking status: Former Smoker -- 1.00 packs/day for 40 years    Quit date: 06/29/2008  . Smokeless tobacco: Never  Used     Comment: quit 2010  . Alcohol Use: 1.5 oz/week    3 Standard drinks or equivalent per week     Comment: 2-3 large drinks, Vodka, 2-3 times a week  . Drug Use: No  . Sexual Activity: Not on file   Other Topics Concern  . Not on file   Social History Narrative   Lives w/  fiance                 Medication List       This list is accurate as of: 06/13/15  5:19 PM.  Always use your most recent med list.               ALPRAZolam 0.5 MG tablet  Commonly known as:  XANAX  Take 1 tablet (0.5 mg total) by mouth at bedtime.     aspirin 81 MG tablet  Take 81 mg by mouth daily.     b complex vitamins capsule  Take 1 capsule by mouth daily.     buPROPion 300 MG 24 hr tablet  Commonly known as:  WELLBUTRIN XL  TAKE 1 TABLET DAILY     CALCIUM 1200 1200-1000 MG-UNIT Chew  Chew 1 tablet by mouth daily.     Coenzyme Q10 200 MG  capsule  Take 200 mg by mouth daily.     escitalopram 20 MG tablet  Commonly known as:  LEXAPRO  Take 1 tablet (20 mg total) by mouth daily.     FLEX OMEGA BENEFITS/VIT D-3 PO  Take 2 capsules by mouth daily.     furosemide 20 MG tablet  Commonly known as:  LASIX  Take 20 mg by mouth daily as needed for edema.     gabapentin 800 MG tablet  Commonly known as:  NEURONTIN  TAKE 1 TABLET FOUR TIMES A DAY     gabapentin 800 MG tablet  Commonly known as:  NEURONTIN  Take 1 tablet (800 mg total) by mouth 4 (four) times daily.     lisinopril 40 MG tablet  Commonly known as:  PRINIVIL,ZESTRIL  Take 1 tablet (40 mg total) by mouth daily.     magnesium oxide 400 MG tablet  Commonly known as:  MAG-OX  Take 400 mg by mouth daily.     Melatonin 5 MG Caps  Take 10 mg by mouth at bedtime. Patient stated that he is taking 10 mg daily     metoprolol succinate 100 MG 24 hr tablet  Commonly known as:  TOPROL-XL  Take 1 tablet (100 mg total) by mouth daily.     multivitamin with minerals tablet  Take 1 tablet by mouth daily.     NASACORT ALLERGY 24HR 55 MCG/ACT Aero nasal inhaler  Generic drug:  triamcinolone  Place 2 sprays into the nose at bedtime.     omeprazole 40 MG capsule  Commonly known as:  PRILOSEC  Take 1 capsule (40 mg total) by mouth 2 (two) times daily.     sulfamethoxazole-trimethoprim 800-160 MG tablet  Commonly known as:  BACTRIM DS,SEPTRA DS  Take 1 tablet by mouth 2 (two) times daily.     tamsulosin 0.4 MG Caps capsule  Commonly known as:  FLOMAX  Take 1 capsule (0.4 mg total) by mouth daily.     vitamin C 1000 MG tablet  Take 1,000 mg by mouth daily.     Vitamin D (Ergocalciferol) 50000 UNITS Caps capsule  Commonly known as:  DRISDOL  Take 1 capsule (  50,000 Units total) by mouth every 7 (seven) days. For 3 months.     zolpidem 10 MG tablet  Commonly known as:  AMBIEN  Take half or one tablet by mouth daily at bedtime.           Objective:    Physical Exam BP 132/76 mmHg  Pulse 87  Temp(Src) 98.8 F (37.1 C) (Oral)  Ht 5\' 10"  (1.778 m)  Wt 298 lb 2 oz (135.229 kg)  BMI 42.78 kg/m2  SpO2 94% General:   Well developed, well nourished . NAD.  HEENT:  Normocephalic . Face symmetric, atraumatic Lungs:  CTA B Normal respiratory effort, no intercostal retractions, no accessory muscle use. Heart: RRR,  no murmur.  No pretibial edema bilaterally  Abd: ND, NT, soft. No CVA tenderness  Declined DRE Skin: Not pale. Not jaundice Neurologic:  alert & oriented X3.  Speech normal, gait appropriate for age and unassisted Psych--  Cognition and judgment appear intact.  Cooperative with normal attention span and concentration.  Behavior appropriate. No anxious or depressed appearing.      Assessment & Plan:   Assessment HTN Hyperlipidemia  Chronic lower extremity edema anxiety depression GI: GERD, Barrett's  esophagus OSA, BiPAP, per pulmonary GU: --sees urology regularly Dr Felipa Eth  --LUTS , failed a # of medications --BPH, elevated PSA:  (-)  biopsy 2009, PSA 05/2014 2.92 --ED --Hematuria, CT, cystoscopy 2009 negative Vitamin D deficiency MSK: --Dx w/ Spinal stenosis after he saw neurology (no neuropathy, see OV note 08-07-2012) --CTS  PLAN UTI: udip c/w infection. Dx UTI vs UTI and prostatitis, declined a DRE . Will get a UA-UCX-PSA, treat w/ bactrim-flomax. If PSA >> baseline, treat as prostatitis w/ a prolonged abx therapy RTC as schedule ~ 07-2014

## 2015-06-13 NOTE — Patient Instructions (Addendum)
Get your blood work before you leave  Drink plenty of fluids  Bactrim DS twice a day for 2 weeks   Flomax for the next 2 weeks  Call f not gradually improving  in the next 7 days    call immediately if severe symptoms, fever, chills.

## 2015-06-14 LAB — PSA: PSA: 40.33 ng/mL — ABNORMAL HIGH (ref 0.10–4.00)

## 2015-06-15 LAB — CULTURE, URINE COMPREHENSIVE: Colony Count: >=100000

## 2015-06-15 MED ORDER — SULFAMETHOXAZOLE-TRIMETHOPRIM 800-160 MG PO TABS: 1.0000 | ORAL_TABLET | Freq: Two times a day (BID) | ORAL | Status: DC

## 2015-06-15 NOTE — Addendum Note (Signed)
Addended byDamita Dunnings D on: 06/15/2015 03:30 PM   Modules accepted: Orders

## 2015-06-21 ENCOUNTER — Telehealth: Payer: Self-pay | Admitting: Internal Medicine

## 2015-06-21 NOTE — Telephone Encounter (Signed)
Please advise 

## 2015-06-21 NOTE — Telephone Encounter (Signed)
Spoke with Pt, he informed me he is not experiencing fever, chills, N/V, is having mild abdominal pain, declines to schedule an appt at this time, states he will wait several more days to see if Sxs improve, if not he will call to schedule an appt.

## 2015-06-21 NOTE — Telephone Encounter (Signed)
Caller name:Robert Gibbs Relationship to patient:self Can be reached:[phone] Pharmacy:[facility]  Reason for call:Patient is still having problems with UTI. No longer has blood but is still pain to urinate and not much comes out. Please advise

## 2015-06-21 NOTE — Telephone Encounter (Signed)
If he has fever, chills, nausea, vomiting, abdominal or flank pain or getting worse >>> needs to be seen. If he is a slightly better but not completely well is in a way what we expected. Let's schedule a follow-up 10 days to 2 weeks from today to be sure he is going in the right direction.

## 2015-07-03 ENCOUNTER — Telehealth: Payer: Self-pay | Admitting: Internal Medicine

## 2015-07-03 MED ORDER — ZOLPIDEM TARTRATE 10 MG PO TABS: 5.0000 mg | ORAL_TABLET | Freq: Every day | ORAL | Status: DC

## 2015-07-03 NOTE — Telephone Encounter (Signed)
Rx printed, awaiting MD signature.  

## 2015-07-03 NOTE — Telephone Encounter (Signed)
Rx faxed to [facility].

## 2015-07-03 NOTE — Telephone Encounter (Signed)
Relation to [id number] Call back [place], [place] [phone] (Phone) [phone] (Fax)         Reason for call:  Patient requesting 90 day supply zolpidem (AMBIEN) 10 MG tablet

## 2015-07-03 NOTE — Telephone Encounter (Signed)
Pt is requesting 90 day supply of Ambien. (Pt uses Mail order pharmacy).  Last OV: 06/13/2015 Last Fill: 02/20/2015 #30 and 0RF Pt sig: 1/2-1 tablet qhs PRN UDS: Not needed  Please advise.

## 2015-07-03 NOTE — Telephone Encounter (Signed)
Okay #90 and 1 refill 

## 2015-07-24 ENCOUNTER — Encounter: Payer: Self-pay | Admitting: Internal Medicine

## 2015-07-24 ENCOUNTER — Other Ambulatory Visit: Payer: Self-pay | Admitting: Internal Medicine

## 2015-07-24 ENCOUNTER — Ambulatory Visit (INDEPENDENT_AMBULATORY_CARE_PROVIDER_SITE_OTHER): Payer: 59 | Admitting: Internal Medicine

## 2015-07-24 VITALS — BP 128/68 | HR 68 | Temp 97.8°F | Ht 70.0 in | Wt 310.5 lb

## 2015-07-24 DIAGNOSIS — N390 Urinary tract infection, site not specified: Secondary | ICD-10-CM | POA: Diagnosis not present

## 2015-07-24 DIAGNOSIS — N419 Inflammatory disease of prostate, unspecified: Secondary | ICD-10-CM | POA: Diagnosis not present

## 2015-07-24 LAB — URINALYSIS, ROUTINE W REFLEX MICROSCOPIC
BILIRUBIN URINE: NEGATIVE
LEUKOCYTES UA: NEGATIVE
Nitrite: NEGATIVE
PH: 5.5 (ref 5.0–8.0)
Specific Gravity, Urine: 1.025 (ref 1.000–1.030)
URINE GLUCOSE: NEGATIVE
UROBILINOGEN UA: 0.2 (ref 0.0–1.0)

## 2015-07-24 MED ORDER — CIPROFLOXACIN HCL 500 MG PO TABS: 500.0000 mg | ORAL_TABLET | Freq: Two times a day (BID) | ORAL | Status: DC

## 2015-07-24 NOTE — Progress Notes (Signed)
Pre visit review using our clinic review tool, if applicable. No additional management support is needed unless otherwise documented below in the visit note. 

## 2015-07-24 NOTE — Progress Notes (Signed)
Subjective:    Patient ID: Robert Gibbs, male    DOB: 1951/05/13, 65 y.o.   MRN: [id number]  DOS:  07/24/2015 Type of visit - description : acute visit Interval history:  seen 06/13/2015 with a UTI / prostatitis, urine culture Escherichia coli, PSA 40,was recommended antibiotics for a total of 4 weeks >>  he did.  Symptoms improve, they never went away however.In the last few days sx are gradually more noticeable: Urinary frequency and mild difficulty emptying the bladder.   Review of Systems Denies fever chills. No abdominal or flank pain. No gross hematuria. No testicular pain or swelling. No dysuria per se  Past Medical History  Diagnosis Date  . GERD (gastroesophageal reflux disease) 03/18/2012  . HTN (hypertension)   . Anxiety and depression   . Vitamin D deficiency   . Spinal stenosis 06-2012    saw neuro w/ "neuropathy", dx w/ spinal stenosis  . Hyperlipidemia   . Erectile dysfunction   . Varicose veins   . CTS (carpal tunnel syndrome)     h/o  . Barrett esophagus   . Wears glasses   . OSA (obstructive sleep apnea) 03/18/2012    on BiPap  . H/O hiatal hernia   . Arthritis   . BPH (benign prostatic hyperplasia)   . Edema of both legs     legs , ankles and feet    Past Surgical History  Procedure Laterality Date  . Elbow surgery  1992    for tennis elbow-rt  . Foot surgery      R, for plantar fasciitis  . Prostate biopsy      2010, (-)  . Back surgery  2002    L4-L5  . Hernia repair      x 2 as a child-ing   . Uvulopalatopharyngoplasty (uppp)/tonsillectomy/septoplasty  1990  . Colonoscopy    . Upper gastrointestinal endoscopy    . Arch bar removal    . Ligation / division saphenous vein      both legs  . Knee arthroscopy with meniscal repair Bilateral 05/04/2013    Procedure: BILATERAL KNEE ARTHROSCOPY WITH POSSIBLE CHONDROPLASTY AND MENISCECTOMY ;  Surgeon: Kerin Salen, MD;  Location: [place];  Service: Orthopedics;  Laterality:  Bilateral;  Right chrondroplasty, debridement, , partial medial menisectomy, removal of loose bodies.   . Tonsillectomy    . Eye surgery Right     cataract with lens implant  . 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 Total knee arthroplasty Right 11/07/2013    Procedure: TOTAL KNEE ARTHROPLASTY;  Surgeon: Kerin Salen, MD;  Location: [place];  Service: Orthopedics;  Laterality: Right;  . Total knee arthroplasty Left 03/01/2014    Dr Mayer Camel  . Total knee arthroplasty Left 03/01/2014    Procedure: TOTAL KNEE ARTHROPLASTY;  Surgeon: Kerin Salen, MD;  Location: [place];  Service: Orthopedics;  Laterality: Left;  . Esophagogastroduodenoscopy  04/2015    w/ biopsy, short tongue of Barrett's without obvious nodularity, mass or esophagitis seen    Social History   Social History  . Marital Status: Divorced    Spouse Name: N/A  . Number of Children: 1  . Years of Education: N/A   Occupational History  . [employer]    Social History Main Topics  . Smoking status: Former Smoker -- 1.00 packs/day for 40 years    Quit date: 06/29/2008  . Smokeless tobacco: Never Used     Comment:  quit 2010  . Alcohol Use: 1.5 oz/week    3 Standard drinks or equivalent per week     Comment: 2-3 large drinks, Vodka, 2-3 times a week  . Drug Use: No  . Sexual Activity: Not on file   Other Topics Concern  . Not on file   Social History Narrative   Lives w/  fiance                 Medication List       This list is accurate as of: 07/24/15  6:47 PM.  Always use your most recent med list.               ALPRAZolam 0.5 MG tablet  Commonly known as:  XANAX  Take 1 tablet (0.5 mg total) by mouth at bedtime.     aspirin 81 MG tablet  Take 81 mg by mouth daily.     b complex vitamins capsule  Take 1 capsule by mouth daily.     buPROPion 300 MG 24 hr tablet  Commonly known as:  WELLBUTRIN XL  TAKE 1 TABLET DAILY     CALCIUM 1200 1200-1000 MG-UNIT Chew  Chew 1 tablet by mouth  daily.     ciprofloxacin 500 MG tablet  Commonly known as:  CIPRO  Take 1 tablet (500 mg total) by mouth 2 (two) times daily.     Coenzyme Q10 200 MG capsule  Take 200 mg by mouth daily.     escitalopram 20 MG tablet  Commonly known as:  LEXAPRO  Take 1 tablet (20 mg total) by mouth daily.     FLEX OMEGA BENEFITS/VIT D-3 PO  Take 2 capsules by mouth daily.     furosemide 20 MG tablet  Commonly known as:  LASIX  Take 20 mg by mouth daily as needed for edema.     gabapentin 800 MG tablet  Commonly known as:  NEURONTIN  Take 1 tablet (800 mg total) by mouth 4 (four) times daily.     lisinopril 40 MG tablet  Commonly known as:  PRINIVIL,ZESTRIL  Take 1 tablet (40 mg total) by mouth daily.     magnesium oxide 400 MG tablet  Commonly known as:  MAG-OX  Take 400 mg by mouth daily.     Melatonin 5 MG Caps  Take 10 mg by mouth at bedtime. Patient stated that he is taking 10 mg daily     metoprolol succinate 100 MG 24 hr tablet  Commonly known as:  TOPROL-XL  Take 1 tablet (100 mg total) by mouth daily.     multivitamin with minerals tablet  Take 1 tablet by mouth daily.     NASACORT ALLERGY 24HR 55 MCG/ACT Aero nasal inhaler  Generic drug:  triamcinolone  Place 2 sprays into the nose at bedtime.     omeprazole 40 MG capsule  Commonly known as:  PRILOSEC  Take 1 capsule (40 mg total) by mouth 2 (two) times daily.     tamsulosin 0.4 MG Caps capsule  Commonly known as:  FLOMAX  Take 1 capsule (0.4 mg total) by mouth daily.     vitamin C 1000 MG tablet  Take 1,000 mg by mouth daily.     Vitamin D (Ergocalciferol) 50000 units Caps capsule  Commonly known as:  DRISDOL  Take 1 capsule (50,000 Units total) by mouth every 7 (seven) days. For 3 months.     zolpidem 10 MG tablet  Commonly known as:  AMBIEN  Take 0.5-1 tablets (5-10 mg total) by mouth at bedtime.           Objective:   Physical Exam BP 128/68 mmHg  Pulse 68  Temp(Src) 97.8 F (36.6 C) (Oral)  Ht  5\' 10"  (1.778 m)  Wt 310 lb 8 oz (140.842 kg)  BMI 44.55 kg/m2  SpO2 97% General:   Well developed, well nourished . NAD.  HEENT:  Normocephalic . Face symmetric, atraumatic  Abdomen:  Not distended, soft, non-tender. No rebound or rigidity. No CVA tenderness  Skin: Not pale. Not jaundice Neurologic:  alert & oriented X3.  Speech normal, gait appropriate for age and unassisted Psych--  Cognition and judgment appear intact.  Cooperative with normal attention span and concentration.  Behavior appropriate. No anxious or depressed appearing.     Assessment & Plan:   Assessment HTN Hyperlipidemia  Chronic lower extremity edema anxiety depression GI: GERD, Barrett's  esophagus OSA, BiPAP, per pulmonary GU: --sees urology regularly Dr Felipa Eth  --LUTS , failed a # of medications --BPH, elevated PSA:  (-)  biopsy 2009, PSA 05/2014 2.92 --ED --Hematuria, CT, cystoscopy 2009 negative Vitamin D deficiency MSK: --Dx w/ Spinal stenosis after he saw neurology (no neuropathy, see OV note 08-07-2012) --CTS  PLAN UTI, prostatitis: Documented UTI prostatitis, s/p  4 weeks of Bactrim, symptoms are returning. Plan: Get a UA, urine culture, restart antibiotics, refer to his urologist within 2 weeks. RTC as planned in few days

## 2015-07-24 NOTE — Patient Instructions (Signed)
Drink plenty of fluids  Start taking ciprofloxacin twice a day  Continue with Flomax  Share  the results with Dr. Felipa Eth  Will see you in few days, call anytime if you have fever, chills or severe symptoms.

## 2015-07-25 LAB — URINE CULTURE

## 2015-07-28 ENCOUNTER — Emergency Department (HOSPITAL_BASED_OUTPATIENT_CLINIC_OR_DEPARTMENT_OTHER): Payer: 59

## 2015-07-28 ENCOUNTER — Encounter (HOSPITAL_BASED_OUTPATIENT_CLINIC_OR_DEPARTMENT_OTHER): Payer: Self-pay | Admitting: *Deleted

## 2015-07-28 ENCOUNTER — Emergency Department (HOSPITAL_BASED_OUTPATIENT_CLINIC_OR_DEPARTMENT_OTHER)
Admission: EM | Admit: 2015-07-28 | Discharge: 2015-07-28 | Disposition: A | Discharge status: Home / Self Care | Payer: 59 | Attending: Emergency Medicine | Admitting: Emergency Medicine

## 2015-07-28 DIAGNOSIS — F419 Anxiety disorder, unspecified: Secondary | ICD-10-CM | POA: Diagnosis not present

## 2015-07-28 DIAGNOSIS — L03115 Cellulitis of right lower limb: Secondary | ICD-10-CM | POA: Insufficient documentation

## 2015-07-28 DIAGNOSIS — R609 Edema, unspecified: Secondary | ICD-10-CM

## 2015-07-28 DIAGNOSIS — L03119 Cellulitis of unspecified part of limb: Secondary | ICD-10-CM

## 2015-07-28 DIAGNOSIS — E785 Hyperlipidemia, unspecified: Secondary | ICD-10-CM | POA: Insufficient documentation

## 2015-07-28 DIAGNOSIS — F329 Major depressive disorder, single episode, unspecified: Secondary | ICD-10-CM | POA: Insufficient documentation

## 2015-07-28 DIAGNOSIS — M7989 Other specified soft tissue disorders: Secondary | ICD-10-CM | POA: Diagnosis present

## 2015-07-28 DIAGNOSIS — Z79899 Other long term (current) drug therapy: Secondary | ICD-10-CM | POA: Insufficient documentation

## 2015-07-28 DIAGNOSIS — Z87891 Personal history of nicotine dependence: Secondary | ICD-10-CM | POA: Diagnosis not present

## 2015-07-28 DIAGNOSIS — L03116 Cellulitis of left lower limb: Secondary | ICD-10-CM | POA: Diagnosis not present

## 2015-07-28 DIAGNOSIS — M1990 Unspecified osteoarthritis, unspecified site: Secondary | ICD-10-CM | POA: Insufficient documentation

## 2015-07-28 DIAGNOSIS — K219 Gastro-esophageal reflux disease without esophagitis: Secondary | ICD-10-CM | POA: Diagnosis not present

## 2015-07-28 DIAGNOSIS — E559 Vitamin D deficiency, unspecified: Secondary | ICD-10-CM | POA: Insufficient documentation

## 2015-07-28 DIAGNOSIS — Z7982 Long term (current) use of aspirin: Secondary | ICD-10-CM | POA: Diagnosis not present

## 2015-07-28 DIAGNOSIS — Z8669 Personal history of other diseases of the nervous system and sense organs: Secondary | ICD-10-CM | POA: Insufficient documentation

## 2015-07-28 DIAGNOSIS — N400 Enlarged prostate without lower urinary tract symptoms: Secondary | ICD-10-CM | POA: Diagnosis not present

## 2015-07-28 DIAGNOSIS — I10 Essential (primary) hypertension: Secondary | ICD-10-CM | POA: Insufficient documentation

## 2015-07-28 HISTORY — DX: Peripheral vascular disease, unspecified: I73.9

## 2015-07-28 LAB — COMPREHENSIVE METABOLIC PANEL
ALBUMIN: 3.8 g/dL (ref 3.5–5.0)
ALK PHOS: 62 U/L (ref 38–126)
ALT: 19 U/L (ref 17–63)
AST: 20 U/L (ref 15–41)
Anion gap: 8 (ref 5–15)
BUN: 12 mg/dL (ref 6–20)
CALCIUM: 8.3 mg/dL — AB (ref 8.9–10.3)
CHLORIDE: 105 mmol/L (ref 101–111)
CO2: 28 mmol/L (ref 22–32)
CREATININE: 0.87 mg/dL (ref 0.61–1.24)
GFR calc Af Amer: >60 mL/min (ref >60)
GFR calc non Af Amer: >60 mL/min (ref >60)
GLUCOSE: 112 mg/dL — AB (ref 65–99)
Potassium: 4 mmol/L (ref 3.5–5.1)
SODIUM: 141 mmol/L (ref 135–145)
Total Bilirubin: 0.6 mg/dL (ref 0.3–1.2)
Total Protein: 6.4 g/dL — ABNORMAL LOW (ref 6.5–8.1)

## 2015-07-28 LAB — URINALYSIS, ROUTINE W REFLEX MICROSCOPIC
Bilirubin Urine: NEGATIVE
GLUCOSE, UA: NEGATIVE mg/dL
HGB URINE DIPSTICK: NEGATIVE
Ketones, ur: NEGATIVE mg/dL
LEUKOCYTES UA: NEGATIVE
Nitrite: NEGATIVE
PROTEIN: NEGATIVE mg/dL
SPECIFIC GRAVITY, URINE: 1.022 (ref 1.005–1.030)
pH: 5.5 (ref 5.0–8.0)

## 2015-07-28 LAB — CBC WITH DIFFERENTIAL/PLATELET
BASOS ABS: 0 10*3/uL (ref 0.0–0.1)
Basophils Relative: 0 %
EOS ABS: 0.3 10*3/uL (ref 0.0–0.7)
EOS PCT: 5 %
HCT: 36.1 % — ABNORMAL LOW (ref 39.0–52.0)
HEMOGLOBIN: 11.6 g/dL — AB (ref 13.0–17.0)
LYMPHS ABS: 1.9 10*3/uL (ref 0.7–4.0)
Lymphocytes Relative: 34 %
MCH: 32.8 pg (ref 26.0–34.0)
MCHC: 32.1 g/dL (ref 30.0–36.0)
MCV: 102 fL — ABNORMAL HIGH (ref 78.0–100.0)
Monocytes Absolute: 0.7 10*3/uL (ref 0.1–1.0)
Monocytes Relative: 12 %
NEUTROS PCT: 49 %
Neutro Abs: 2.7 10*3/uL (ref 1.7–7.7)
PLATELETS: 206 10*3/uL (ref 150–400)
RBC: 3.54 MIL/uL — AB (ref 4.22–5.81)
RDW: 15.2 % (ref 11.5–15.5)
WBC: 5.6 10*3/uL (ref 4.0–10.5)

## 2015-07-28 LAB — BRAIN NATRIURETIC PEPTIDE: B Natriuretic Peptide: 69.4 pg/mL (ref 0.0–100.0)

## 2015-07-28 LAB — TROPONIN I

## 2015-07-28 MED ORDER — FUROSEMIDE 10 MG/ML IJ SOLN
20.0000 mg | Freq: Once | INTRAMUSCULAR | Status: AC
  Administered 2015-07-28: 20 mg via INTRAVENOUS
  Filled 2015-07-28: qty 2

## 2015-07-28 MED ORDER — VANCOMYCIN HCL IN DEXTROSE 1-5 GM/200ML-% IV SOLN
1000.0000 mg | Freq: Once | INTRAVENOUS | Status: AC
  Administered 2015-07-28: 1000 mg via INTRAVENOUS
  Filled 2015-07-28: qty 200

## 2015-07-28 MED ORDER — FUROSEMIDE 20 MG PO TABS: 20.0000 mg | ORAL_TABLET | Freq: Every day | ORAL | Status: DC

## 2015-07-28 MED ORDER — SULFAMETHOXAZOLE-TRIMETHOPRIM 800-160 MG PO TABS: 1.0000 | ORAL_TABLET | Freq: Two times a day (BID) | ORAL | Status: AC

## 2015-07-28 NOTE — Discharge Instructions (Signed)
Cellulitis Take the antibiotics and water pills as prescribed. Follow up with Dr. Larose Kells this week. Return to the ED if you develop new or worsening symptoms. Cellulitis is an infection of the skin and the tissue beneath it. The infected area is usually red and tender. Cellulitis occurs most often in the arms and lower legs.  CAUSES  Cellulitis is caused by bacteria that enter the skin through cracks or cuts in the skin. The most common types of bacteria that cause cellulitis are staphylococci and streptococci. SIGNS AND SYMPTOMS   Redness and warmth.  Swelling.  Tenderness or pain.  Fever. DIAGNOSIS  Your health care provider can usually determine what is wrong based on a physical exam. Blood tests may also be done. TREATMENT  Treatment usually involves taking an antibiotic medicine. HOME CARE INSTRUCTIONS   Take your antibiotic medicine as directed by your health care provider. Finish the antibiotic even if you start to feel better.  Keep the infected arm or leg elevated to reduce swelling.  Apply a warm cloth to the affected area up to 4 times per day to relieve pain.  Take medicines only as directed by your health care provider.  Keep all follow-up visits as directed by your health care provider. SEEK MEDICAL CARE IF:   You notice red streaks coming from the infected area.  Your red area gets larger or turns dark in color.  Your bone or joint underneath the infected area becomes painful after the skin has healed.  Your infection returns in the same area or another area.  You notice a swollen bump in the infected area.  You develop new symptoms.  You have a fever. SEEK IMMEDIATE MEDICAL CARE IF:   You feel very sleepy.  You develop vomiting or diarrhea.  You have a general ill feeling (malaise) with muscle aches and pains.   This information is not intended to replace advice given to you by your health care provider. Make sure you discuss any questions you have  with your health care provider.   Document Released: 03/19/2005 Document Revised: 02/28/2015 Document Reviewed: 08/25/2011 Elsevier Interactive Patient Education [employer].

## 2015-07-28 NOTE — ED Notes (Signed)
Both legs bilateral pulses identified with doppler, sites marked

## 2015-07-28 NOTE — ED Notes (Signed)
3 days of Bilateral leg swelling and redness. Denies SOB, denies chest pain. Currently taking antibiotics for UTI

## 2015-07-28 NOTE — ED Notes (Signed)
US at bedside

## 2015-07-28 NOTE — ED Provider Notes (Signed)
CSN: [id number]     Arrival date & time 07/28/15  1228 History  By signing my name below, I, Sonum Patel, attest that this documentation has been prepared under the direction and in the presence of Ezequiel Essex, MD. Electronically Signed: Sonum Patel, Education administrator. 07/28/2015. 1:05 PM.     Chief Complaint  Patient presents with  . Leg Swelling    The history is provided by the patient. No language interpreter was used.     HPI Comments: Robert Gibbs is a 65 y.o. male with past medical history of HTN, OSA, peripheral vascular disease who presents to the Emergency Department complaining of 3 days of worsened bilateral lower leg swelling with associated redness. He reports having bilateral foot and ankle swelling at baseline but states it is increased currently. He has associated bilateral lower leg discomfort which he describes as tightness. He currently has mild SOB, mild HA, and a recent mild cough. He uses a BiPAP machine at home and sleeps in a recliner with feet somewhat elevated. He was started on Flomax and Cipro for UTI 3 days ago. He denies CP, fever, vomiting. He denies history of DM or pulmonary disease. He reports a negative stress test and cardiac catheterization a couple of years ago.   Past Medical History  Diagnosis Date  . GERD (gastroesophageal reflux disease) 03/18/2012  . HTN (hypertension)   . Anxiety and depression   . Vitamin D deficiency   . Spinal stenosis 06-2012    saw neuro w/ "neuropathy", dx w/ spinal stenosis  . Hyperlipidemia   . Erectile dysfunction   . Varicose veins   . CTS (carpal tunnel syndrome)     h/o  . Barrett esophagus   . Wears glasses   . OSA (obstructive sleep apnea) 03/18/2012    on BiPap  . H/O hiatal hernia   . Arthritis   . BPH (benign prostatic hyperplasia)   . Edema of both legs     legs , ankles and feet  . PVD (peripheral vascular disease) [facility])    Past Surgical History  Procedure Laterality Date  . Elbow surgery  1992    for  tennis elbow-rt  . Foot surgery      R, for plantar fasciitis  . Prostate biopsy      2010, (-)  . Back surgery  2002    L4-L5  . Hernia repair      x 2 as a child-ing   . Uvulopalatopharyngoplasty (uppp)/tonsillectomy/septoplasty  1990  . Colonoscopy    . Upper gastrointestinal endoscopy    . Arch bar removal    . Ligation / division saphenous vein      both legs  . Knee arthroscopy with meniscal repair Bilateral 05/04/2013    Procedure: BILATERAL KNEE ARTHROSCOPY WITH POSSIBLE CHONDROPLASTY AND MENISCECTOMY ;  Surgeon: Kerin Salen, MD;  Location: [place];  Service: Orthopedics;  Laterality: Bilateral;  Right chrondroplasty, debridement, , partial medial menisectomy, removal of loose bodies.   . Tonsillectomy    . Eye surgery Right     cataract with lens implant  . 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 Total knee arthroplasty Right 11/07/2013    Procedure: TOTAL KNEE ARTHROPLASTY;  Surgeon: Kerin Salen, MD;  Location: [place];  Service: Orthopedics;  Laterality: Right;  . Total knee arthroplasty Left 03/01/2014    Dr Mayer Camel  . Total knee arthroplasty Left 03/01/2014    Procedure: TOTAL KNEE ARTHROPLASTY;  Surgeon: Kerin Salen, MD;  Location: [place];  Service: Orthopedics;  Laterality: Left;  . Esophagogastroduodenoscopy  04/2015    w/ biopsy, short tongue of Barrett's without obvious nodularity, mass or esophagitis seen   Family History  Problem Relation Age of Onset  . Colon cancer Neg Hx   . Prostate cancer Neg Hx   . Diabetes Neg Hx   . Cancer Neg Hx   . CAD Father     ?  Marland Kitchen Alzheimer's disease Mother    Social History  Substance Use Topics  . Smoking status: Former Smoker -- 1.00 packs/day for 40 years    Quit date: 06/29/2008  . Smokeless tobacco: Never Used     Comment: quit 2010  . Alcohol Use: 1.5 oz/week    3 Standard drinks or equivalent per week     Comment: 2-3 large drinks, Vodka, 2-3 times a week    Review of  Systems  A complete 10 system review of systems was obtained and all systems are negative except as noted in the HPI and PMH.    Allergies  Codeine; Nsaids; Simvastatin; and Tolmetin  Home Medications   Prior to Admission medications   Medication Sig Start Date End Date Taking? Authorizing Provider  ALPRAZolam Duanne Moron) 0.5 MG tablet Take 1 tablet (0.5 mg total) by mouth at bedtime. 01/31/15  Yes Colon Branch, MD  Ascorbic Acid (VITAMIN C) 1000 MG tablet Take 1,000 mg by mouth daily.   Yes Historical Provider, MD  aspirin 81 MG tablet Take 81 mg by mouth daily.   Yes Historical Provider, MD  b complex vitamins capsule Take 1 capsule by mouth daily.   Yes Historical Provider, MD  buPROPion (WELLBUTRIN XL) 300 MG 24 hr tablet TAKE 1 TABLET DAILY 06/13/15  Yes Colon Branch, MD  Calcium Carbonate-Vit D-Min (CALCIUM 1200) 1200-1000 MG-UNIT CHEW Chew 1 tablet by mouth daily.    Yes Historical Provider, MD  ciprofloxacin (CIPRO) 500 MG tablet Take 1 tablet (500 mg total) by mouth 2 (two) times daily. 07/24/15  Yes Colon Branch, MD  Coenzyme Q10 200 MG capsule Take 200 mg by mouth daily.   Yes Historical Provider, MD  escitalopram (LEXAPRO) 20 MG tablet Take 1 tablet (20 mg total) by mouth daily. 01/17/15  Yes Colon Branch, MD  gabapentin (NEURONTIN) 800 MG tablet Take 1 tablet (800 mg total) by mouth 4 (four) times daily. 04/23/15  Yes Colon Branch, MD  lisinopril (PRINIVIL,ZESTRIL) 40 MG tablet Take 1 tablet (40 mg total) by mouth daily. 01/17/15  Yes Colon Branch, MD  magnesium oxide (MAG-OX) 400 MG tablet Take 400 mg by mouth daily.   Yes Historical Provider, MD  Melatonin 5 MG CAPS Take 10 mg by mouth at bedtime. Patient stated that he is taking 10 mg daily   Yes Historical Provider, MD  metoprolol succinate (TOPROL-XL) 100 MG 24 hr tablet Take 1 tablet (100 mg total) by mouth daily. 02/15/15  Yes Colon Branch, MD  Multiple Vitamins-Minerals (MULTIVITAMIN WITH MINERALS) tablet Take 1 tablet by mouth daily.   Yes  Historical Provider, MD  Omega-3 Fat Ac-Cholecalciferol (FLEX OMEGA BENEFITS/VIT D-3 PO) Take 2 capsules by mouth daily.   Yes Historical Provider, MD  omeprazole (PRILOSEC) 40 MG capsule Take 1 capsule (40 mg total) by mouth 2 (two) times daily. 01/31/15  Yes Colon Branch, MD  tamsulosin (FLOMAX) 0.4 MG CAPS capsule Take 1 capsule (0.4 mg total) by mouth daily. 07/25/15  Yes Colon Branch, MD  triamcinolone (NASACORT ALLERGY 24HR) 55 MCG/ACT AERO nasal inhaler Place 2 sprays into the nose at bedtime.   Yes Historical Provider, MD  Vitamin D, Ergocalciferol, (DRISDOL) 50000 UNITS CAPS capsule Take 1 capsule (50,000 Units total) by mouth every 7 (seven) days. For 3 months. 02/05/15  Yes Colon Branch, MD  zolpidem (AMBIEN) 10 MG tablet Take 0.5-1 tablets (5-10 mg total) by mouth at bedtime. 07/03/15  Yes Colon Branch, MD  furosemide (LASIX) 20 MG tablet Take 1 tablet (20 mg total) by mouth daily. As needed for swelling 07/28/15   Ezequiel Essex, MD  sulfamethoxazole-trimethoprim (BACTRIM DS,SEPTRA DS) 800-160 MG tablet Take 1 tablet by mouth 2 (two) times daily. 07/28/15 08/04/15  Ezequiel Essex, MD   BP 144/75 mmHg  Pulse 60  Temp(Src) 98.4 F (36.9 C) (Oral)  Resp 16  Ht 5\' 10"  (1.778 m)  Wt 313 lb 3.2 oz (142.067 kg)  BMI 44.94 kg/m2  SpO2 100% Physical Exam  Constitutional: He is oriented to person, place, and time. He appears well-developed and well-nourished. No distress.  HENT:  Head: Normocephalic and atraumatic.  Mouth/Throat: Oropharynx is clear and moist. No oropharyngeal exudate.  Eyes: Conjunctivae and EOM are normal. Pupils are equal, round, and reactive to light.  Neck: Normal range of motion. Neck supple.  No meningismus.  Cardiovascular: Normal rate, regular rhythm, normal heart sounds and intact distal pulses.   No murmur heard. Diffuse palpable distal pulses.   Pulmonary/Chest: Effort normal and breath sounds normal. No respiratory distress.  Decreased breath sounds at the bases.    Abdominal: Soft. There is no tenderness. There is no rebound and no guarding.  Musculoskeletal: Normal range of motion. He exhibits edema. He exhibits no tenderness.  +3 pitting edema to knees bilaterally.   Neurological: He is alert and oriented to person, place, and time. No cranial nerve deficit. He exhibits normal muscle tone. Coordination normal.  No ataxia on finger to nose bilaterally. No pronator drift. 5/5 strength throughout. CN 2-12 intact.Equal grip strength. Sensation intact.   Skin: Skin is warm. There is erythema.  Circumferential erythema of bilateral legs to knees  Psychiatric: He has a normal mood and affect. His behavior is normal.  Nursing note and vitals reviewed.   ED Course  Procedures (including critical care time)  DIAGNOSTIC STUDIES: Oxygen Saturation is 99% on RA, normal by my interpretation.    COORDINATION OF CARE: 1:10 PM Discussed treatment plan with pt at bedside and pt agreed to plan.   Labs Review Labs Reviewed  CBC WITH DIFFERENTIAL/PLATELET - Abnormal; Notable for the following:    RBC 3.54 (*)    Hemoglobin 11.6 (*)    HCT 36.1 (*)    MCV 102.0 (*)    All other components within normal limits  COMPREHENSIVE METABOLIC PANEL - Abnormal; Notable for the following:    Glucose, Bld 112 (*)    Calcium 8.3 (*)    Total Protein 6.4 (*)    All other components within normal limits  BRAIN NATRIURETIC PEPTIDE  TROPONIN I  URINALYSIS, ROUTINE W REFLEX MICROSCOPIC (NOT AT [facility])    Imaging Review Dg Chest 2 View  07/28/2015  CLINICAL DATA:  Shortness of breath and bilateral leg swelling. EXAM: CHEST  2 VIEW COMPARISON:  10/28/2013 FINDINGS: Heart size is upper limits of normal. The trachea is midline. Both lungs are clear without airspace disease or pulmonary edema. No pleural effusions. Mild degenerative changes in the thoracic spine. IMPRESSION:  No active cardiopulmonary disease. Electronically Signed   By: Markus Daft M.D.   On: 07/28/2015 13:48    US Venous Img Lower Bilateral  07/28/2015  CLINICAL DATA:  Acute bilateral lower extremity swelling. EXAM: BILATERAL LOWER EXTREMITY VENOUS DOPPLER ULTRASOUND TECHNIQUE: Gray-scale sonography with graded compression, as well as color Doppler and duplex ultrasound were performed to evaluate the lower extremity deep venous systems from the level of the common femoral vein and including the common femoral, femoral, profunda femoral, popliteal and calf veins including the posterior tibial, peroneal and gastrocnemius veins when visible. The superficial great saphenous vein was also interrogated. Spectral Doppler was utilized to evaluate flow at rest and with distal augmentation maneuvers in the common femoral, femoral and popliteal veins. COMPARISON:  March 08, 2014. FINDINGS: RIGHT LOWER EXTREMITY Common Femoral Vein: No evidence of thrombus. Normal compressibility, respiratory phasicity and response to augmentation. Saphenofemoral Junction: No evidence of thrombus. Normal compressibility and flow on color Doppler imaging. Profunda Femoral Vein: No evidence of thrombus. Normal compressibility and flow on color Doppler imaging. Femoral Vein: No evidence of thrombus. Normal compressibility, respiratory phasicity and response to augmentation. Popliteal Vein: No evidence of thrombus. Normal compressibility, respiratory phasicity and response to augmentation. Calf Veins: No evidence of thrombus. Normal compressibility and flow on color Doppler imaging. Superficial Great Saphenous Vein: No evidence of thrombus. Normal compressibility and flow on color Doppler imaging. Venous Reflux:  None. Other Findings:  Edema is noted in the soft tissues. LEFT LOWER EXTREMITY Common Femoral Vein: No evidence of thrombus. Normal compressibility, respiratory phasicity and response to augmentation. Saphenofemoral Junction: No evidence of thrombus. Normal compressibility and flow on color Doppler imaging. Profunda Femoral Vein: No  evidence of thrombus. Normal compressibility and flow on color Doppler imaging. Femoral Vein: No evidence of thrombus. Normal compressibility, respiratory phasicity and response to augmentation. Popliteal Vein: No evidence of thrombus. Normal compressibility, respiratory phasicity and response to augmentation. Calf Veins: No evidence of thrombus. Normal compressibility and flow on color Doppler imaging. Superficial Great Saphenous Vein: No evidence of thrombus. Normal compressibility and flow on color Doppler imaging. Venous Reflux:  None. Other Findings:  Edema is noted in the soft tissues. IMPRESSION: No evidence of deep venous thrombosis seen in either lower extremity. Electronically Signed   By: Marijo Conception, M.D.   On: 07/28/2015 14:33   I have personally reviewed and evaluated these images and lab results as part of my medical decision-making.   EKG Interpretation   Date/Time:  Saturday July 28 2015 13:21:58 EST Ventricular Rate:  70 PR Interval:  164 QRS Duration: 162 QT Interval:  471 QTC Calculation: 508 R Axis:   -39 Text Interpretation:  Sinus rhythm Right bundle branch block Baseline  wander in lead(s) V3 No significant change was found Confirmed by Wyvonnia Dusky   MD, Tyjay Galindo [phone]) on 07/28/2015 1:43:09 PM      MDM   Final diagnoses:  Peripheral edema  Cellulitis of lower extremity, unspecified laterality   3 days of bilateral leg swelling and redness. Patient reports some swelling at baseline but this is worse and the redness is new. Denies chest pain or shortness of breath. Denies fever or vomiting.   Chest x-ray is negative. Ultrasounds are negative for DVT. Intact pulses with Doppler.  Suspect possible cellulitis. Patient does not have evidence of acute heart failure exacerbation.  IV lasix, IV vancomycin given.  NO CP or SOB.  D/w worsening peripheral edema with cellulitis.  Observation admission offered, patient declines. Will restart lasix  which he hadn't been  taking due to urinary frequency.  Add bactrim to cipro he is taking for prostatitis. Return to the ED with CP, SOB, worsening swelling or redness or other worsening symptoms.   I personally performed the services described in this documentation, which was scribed in my presence. The recorded information has been reviewed and is accurate.      Ezequiel Essex, MD 07/28/15 [phone]

## 2015-07-30 ENCOUNTER — Telehealth: Payer: Self-pay | Admitting: Internal Medicine

## 2015-07-30 NOTE — Telephone Encounter (Signed)
Called to follow up with patient.  Pt states he has been taking medications as prescribed (Antibx, Flomax, and Lasix).  States legs are not worse, but have not shown much improvement.  Pt states he does not feel like he needs to follow up with PCP at this time, but would call if anything changes.    Pt has an appt with Dr. Larose Kells on 08/07/15 @ 2 pm and plans to keep this appt.

## 2015-07-30 NOTE — Telephone Encounter (Signed)
Pt called to see if Dr. Larose Kells was able to review ER visit. Please return call at [phone].

## 2015-07-30 NOTE — Telephone Encounter (Signed)
Went to the ER with possible cellulitis, please check on the patient, doing better? Does he need to be seen?Marland Kitchen

## 2015-08-03 ENCOUNTER — Ambulatory Visit: Payer: 59 | Admitting: Internal Medicine

## 2015-08-07 ENCOUNTER — Ambulatory Visit: Payer: 59 | Admitting: Internal Medicine

## 2015-08-07 ENCOUNTER — Telehealth: Payer: Self-pay | Admitting: Internal Medicine

## 2015-08-13 NOTE — Telephone Encounter (Signed)
Pt was no show 08/07/15 2:00pm for follow up appt, this is 3rd no within 12 months, pt has not rescheduled, charge or no charge?

## 2015-08-14 ENCOUNTER — Encounter: Payer: Self-pay | Admitting: Internal Medicine

## 2015-08-14 NOTE — Telephone Encounter (Signed)
Charge. 

## 2015-08-14 NOTE — Telephone Encounter (Signed)
Marked to charge and mailing no show letter °

## 2015-08-20 ENCOUNTER — Telehealth: Payer: Self-pay | Admitting: Internal Medicine

## 2015-08-20 NOTE — Telephone Encounter (Signed)
Advise patient, was treated for prostatitis 2 months ago, he is supposed to have a follow-up. If unable to come for a visit, then come back for labs only: PSA, UA, urine culture DX prostatitis, UTI

## 2015-08-21 NOTE — Telephone Encounter (Signed)
Pt saw his urologist Dr. Felipa Eth on 08/03/2015, he has 1 month f/u scheduled w/ him on 08/31/2015. I have printed and OV notes from [place] for you to review, would you like for Pt to still be seen here?

## 2015-08-21 NOTE — Telephone Encounter (Signed)
Thank you, patient had appropriate follow-up for UTI prostatitis.

## 2015-10-01 ENCOUNTER — Telehealth: Payer: Self-pay | Admitting: Internal Medicine

## 2015-10-01 MED ORDER — GABAPENTIN 800 MG PO TABS: 800.0000 mg | ORAL_TABLET | Freq: Four times a day (QID) | ORAL | Status: DC

## 2015-10-01 MED ORDER — OMEPRAZOLE 40 MG PO CPDR: 40.0000 mg | DELAYED_RELEASE_CAPSULE | Freq: Two times a day (BID) | ORAL | Status: DC

## 2015-10-01 MED ORDER — ESCITALOPRAM OXALATE 20 MG PO TABS: 20.0000 mg | ORAL_TABLET | Freq: Every day | ORAL | Status: DC

## 2015-10-01 MED ORDER — METOPROLOL SUCCINATE ER 100 MG PO TB24: 100.0000 mg | ORAL_TABLET | Freq: Every day | ORAL | Status: DC

## 2015-10-01 MED ORDER — LISINOPRIL 40 MG PO TABS: 40.0000 mg | ORAL_TABLET | Freq: Every day | ORAL | Status: DC

## 2015-10-01 MED ORDER — BUPROPION HCL ER (XL) 300 MG PO TB24: 300.0000 mg | ORAL_TABLET | Freq: Every day | ORAL | Status: DC

## 2015-10-01 NOTE — Telephone Encounter (Signed)
Can be reached: [phone] Pharmacy: [facility] [place]  Reason for call: Change of pharmacy to [facility]. Needs 90 day supply for cost savings. Omeprazole - out - pt prefers Dr. Avie Echevaria because it's the only one that works Alprazolam - out  Bupropion - 10 days Gabapentin - 2 weeks Escitalopram - 10 days Lisinopril - 2 tablets Metoprolol - 2 weeks

## 2015-10-01 NOTE — Telephone Encounter (Signed)
Omeprazole, Wellbutrin, Gabapentin, Lexapro, Lisinopril and Metoprolol sent to [facility]. Per telephone note from 02/19/2015, Dr. Larose Kells will no longer prescribe Alprazolam to Pt due to multiple high risk UDS results.

## 2015-10-02 ENCOUNTER — Telehealth: Payer: Self-pay | Admitting: *Deleted

## 2015-10-02 NOTE — Telephone Encounter (Signed)
PA forms completed and faxed to [facility], awaiting determination. JG//CMA

## 2015-10-04 NOTE — Telephone Encounter (Signed)
Received response that PA is not required. JG//CMA

## 2015-10-09 ENCOUNTER — Telehealth: Payer: Self-pay | Admitting: Internal Medicine

## 2015-10-09 NOTE — Telephone Encounter (Signed)
Caller name: Self  Can be reached: [phone]   Reason for call: Patient request a PA for 180/40 mg capsules of omeprazole (PRILOSEC) 40 MG capsule [id number]   States the number for PA is [phone] or fax [phone]

## 2015-10-09 NOTE — Telephone Encounter (Signed)
Pt spoke in length w/ Robert Gibbs regarding why Dr. Larose Kells will no longer prescribe Alprazolam to him (see telephone note dated 02/19/2015).

## 2015-10-09 NOTE — Telephone Encounter (Signed)
Spoke with patient. He says he forgot that alprazolam was switched to Ambien and now requests a refill on the Ambien.  He also wanted to stress that the omeprazole prescription has to be the Dr. Ephriam Jenkins Formula. He says it's the only kind that works for him.  Ambien Last filled: 07/03/15 Amt: 90,1  Sent to [facility]---states Rx needs to be sent to [facility] and requests a 90 day supply

## 2015-10-09 NOTE — Telephone Encounter (Signed)
Pt called back he did not received alprazolam or omeprazole. Told him omeprazole PA was submitted and stated not needed. He will follow up with [facility]. Pt would like call back regarding explanation for Alprazolam.

## 2015-10-09 NOTE — Telephone Encounter (Signed)
Call Documentation      Chaya Jan, CMA at 10/04/2015 10:46 AM     Status: Signed       Expand All Collapse All   Received response that PA is not required. JG//CMA            Chaya Jan, CMA at 10/02/2015 1:33 PM     Status: Signed       Expand All Collapse All   PA forms completed and faxed to [facility], awaiting determination. JG//CMA

## 2015-10-10 NOTE — Telephone Encounter (Signed)
omeprazole (PRILOSEC) 40 MG capsule 180 capsule 1 10/01/2015       Sig - Route: Take 1 capsule (40 mg total) by mouth 2 (two) times daily. - Oral     Notes to Pharmacy: DISPENSE ONLY DR'S READDY FORMULA     E-Prescribing Status: Receipt confirmed by pharmacy (10/01/2015 2:51 PM EDT)

## 2015-10-10 NOTE — Telephone Encounter (Signed)
Robert Gibbs  #90 and 1 refill Okay omeprazole as requested by the patient (If helikes a specific formulation, may be more expensive for him)

## 2015-10-11 MED ORDER — ZOLPIDEM TARTRATE 10 MG PO TABS: 5.0000 mg | ORAL_TABLET | Freq: Every day | ORAL | Status: DC

## 2015-10-11 NOTE — Addendum Note (Signed)
Addended byDamita Dunnings D on: 10/11/2015 07:45 AM   Modules accepted: Orders

## 2015-10-11 NOTE — Telephone Encounter (Signed)
Rx faxed to [facility]

## 2015-10-11 NOTE — Telephone Encounter (Signed)
Ambien Rx printed, awaiting MD signature.

## 2015-10-22 NOTE — Telephone Encounter (Signed)
Please advise 

## 2015-10-22 NOTE — Telephone Encounter (Signed)
Can be reached: [phone] [facility]acy: [id number] [place], [place]  Reason for call: pt would like rx for omeprazole 40mg  sent to local pharmacy (Dr. Alpha Gula formula only) and reduce to 1 time per day so that he can get med filled. He said the hold up is for the Dr. Alpha Gula brand and the 2 times per day.

## 2015-10-23 MED ORDER — OMEPRAZOLE 40 MG PO CPDR: 40.0000 mg | DELAYED_RELEASE_CAPSULE | Freq: Every day | ORAL | Status: DC

## 2015-10-23 NOTE — Addendum Note (Signed)
Addended by: Murtis Sink A on: 10/23/2015 01:39 PM   Modules accepted: Orders

## 2015-10-23 NOTE — Telephone Encounter (Signed)
Okay to change the prescription to one time a day, see notes from  [place]

## 2015-10-23 NOTE — Telephone Encounter (Signed)
Omeprazole 40mg  once daily (Dr. Alpha Gula formula only) [facility]. JG//CMA

## 2016-01-08 ENCOUNTER — Telehealth: Payer: Self-pay | Admitting: Internal Medicine

## 2016-01-08 MED ORDER — LISINOPRIL 40 MG PO TABS: 40.0000 mg | ORAL_TABLET | Freq: Every day | ORAL | Status: DC

## 2016-01-08 MED ORDER — ZOLPIDEM TARTRATE 10 MG PO TABS: 5.0000 mg | ORAL_TABLET | Freq: Every day | ORAL | Status: DC

## 2016-01-08 NOTE — Telephone Encounter (Signed)
°  Relationship to patient: Self Can be reached: [phone]  Pharmacy:  [id number] [place], [place] [phone] (Fax)         Reason for call: request refill on lisinopril (PRINIVIL,ZESTRIL) 40 MG tablet [id number]   zolpidem (AMBIEN) 10 MG tablet [id number]

## 2016-01-08 NOTE — Telephone Encounter (Signed)
[facility], Ambien Rx printed, awaiting MD signature.

## 2016-01-08 NOTE — Telephone Encounter (Signed)
[facility] Please inform Pt that he is due for CPE, per Dr. Larose Kells no further refills until seen. Please schedule at Pt's convenience. Thank you.

## 2016-01-08 NOTE — Telephone Encounter (Signed)
Okay #90, no further refills without office visit. Due for a CPX. Let the patient known

## 2016-01-08 NOTE — Telephone Encounter (Signed)
Pt is requesting refill on Ambien.  Last OV: 07/24/2015, no future appt needed Last Fill: 10/11/2015 #90 and 1RF Pt sig: 1/2 to 1 tab qhs (Was sent to [facility], now using [facility] mail service) UDS: Not needed  Please advise.

## 2016-01-09 ENCOUNTER — Telehealth: Payer: Self-pay | Admitting: Internal Medicine

## 2016-01-09 MED ORDER — GABAPENTIN 800 MG PO TABS: 800.0000 mg | ORAL_TABLET | Freq: Four times a day (QID) | ORAL | Status: DC

## 2016-01-09 NOTE — Telephone Encounter (Signed)
Patient schedule CPE

## 2016-01-09 NOTE — Telephone Encounter (Signed)
Rx sent 

## 2016-01-09 NOTE — Telephone Encounter (Signed)
Relation to [id number] Call back [place]: [id number] [place], [place] [phone] (Phone) [phone] (Fax)         Reason for call:  Patient requesting a 90 day supply gabapentin (NEURONTIN) 800 MG tablet

## 2016-01-30 ENCOUNTER — Other Ambulatory Visit: Payer: Self-pay

## 2016-01-30 MED ORDER — METOPROLOL SUCCINATE ER 100 MG PO TB24: 100.0000 mg | ORAL_TABLET | Freq: Every day | ORAL | 1 refills | Status: DC

## 2016-01-30 MED ORDER — BUPROPION HCL ER (XL) 300 MG PO TB24: 300.0000 mg | ORAL_TABLET | Freq: Every day | ORAL | 1 refills | Status: DC

## 2016-01-30 MED ORDER — ESCITALOPRAM OXALATE 20 MG PO TABS: 20.0000 mg | ORAL_TABLET | Freq: Every day | ORAL | 1 refills | Status: DC

## 2016-02-04 ENCOUNTER — Telehealth: Payer: Self-pay | Admitting: Internal Medicine

## 2016-02-04 NOTE — Telephone Encounter (Signed)
Received medication clarification form for Lexapro on 02/01/2016. Form w/ PCP.

## 2016-02-04 NOTE — Telephone Encounter (Signed)
Pt called in because he says that his [place], [place] is requesting a call in regards to one of his Rx. (escitalopram) Pt says that he was just told to have provider call - [phone]       CB: [phone] - if any questions.

## 2016-02-05 NOTE — Telephone Encounter (Signed)
Medication clarification form completed and faxed back to [facility] at [phone] successfully. Medication clarification form sent for scanning.

## 2016-02-29 ENCOUNTER — Other Ambulatory Visit: Payer: Self-pay | Admitting: Internal Medicine

## 2016-02-29 NOTE — Telephone Encounter (Signed)
Spoke with pharmacy and Rx from 10/23/15 still active and they will get refill ready for pt. Attempted to reach pt. No answer on home number. Sent [employer].

## 2016-02-29 NOTE — Telephone Encounter (Signed)
°  Relationship to patient: Self  Can be reached: [phone]   [facility]acy:  [id number] [place], [place] [phone] (Phone) [phone] (Fax)     Reason for call: omeprazole (PRILOSEC) 40 MG capsule [id number]

## 2016-03-19 DIAGNOSIS — H2512 Age-related nuclear cataract, left eye: Secondary | ICD-10-CM | POA: Diagnosis not present

## 2016-03-19 DIAGNOSIS — H2513 Age-related nuclear cataract, bilateral: Secondary | ICD-10-CM | POA: Diagnosis not present

## 2016-03-21 ENCOUNTER — Ambulatory Visit (INDEPENDENT_AMBULATORY_CARE_PROVIDER_SITE_OTHER): Payer: Medicare Other | Admitting: Internal Medicine

## 2016-03-21 ENCOUNTER — Encounter: Payer: Self-pay | Admitting: Internal Medicine

## 2016-03-21 VITALS — BP 126/80 | HR 68 | Temp 98.1°F | Resp 14 | Ht 70.0 in | Wt 310.2 lb

## 2016-03-21 DIAGNOSIS — E785 Hyperlipidemia, unspecified: Secondary | ICD-10-CM

## 2016-03-21 DIAGNOSIS — E559 Vitamin D deficiency, unspecified: Secondary | ICD-10-CM | POA: Diagnosis not present

## 2016-03-21 DIAGNOSIS — Z87891 Personal history of nicotine dependence: Secondary | ICD-10-CM

## 2016-03-21 DIAGNOSIS — D649 Anemia, unspecified: Secondary | ICD-10-CM

## 2016-03-21 DIAGNOSIS — Z09 Encounter for follow-up examination after completed treatment for conditions other than malignant neoplasm: Secondary | ICD-10-CM

## 2016-03-21 DIAGNOSIS — Z0000 Encounter for general adult medical examination without abnormal findings: Secondary | ICD-10-CM

## 2016-03-21 DIAGNOSIS — Z23 Encounter for immunization: Secondary | ICD-10-CM

## 2016-03-21 DIAGNOSIS — Z114 Encounter for screening for human immunodeficiency virus [HIV]: Secondary | ICD-10-CM

## 2016-03-21 DIAGNOSIS — I10 Essential (primary) hypertension: Secondary | ICD-10-CM

## 2016-03-21 DIAGNOSIS — Z1159 Encounter for screening for other viral diseases: Secondary | ICD-10-CM

## 2016-03-21 DIAGNOSIS — N390 Urinary tract infection, site not specified: Secondary | ICD-10-CM

## 2016-03-21 LAB — CBC WITH DIFFERENTIAL/PLATELET
BASOS ABS: 0 {cells}/uL (ref 0–200)
Basophils Relative: 0 %
EOS PCT: 3 %
Eosinophils Absolute: 216 cells/uL (ref 15–500)
HCT: 42.2 % (ref 38.5–50.0)
Hemoglobin: 14.6 g/dL (ref 13.2–17.1)
Lymphocytes Relative: 36 %
Lymphs Abs: 2592 cells/uL (ref 850–3900)
MCH: 33.5 pg — AB (ref 27.0–33.0)
MCHC: 34.6 g/dL (ref 32.0–36.0)
MCV: 96.8 fL (ref 80.0–100.0)
MONOS PCT: 9 %
MPV: 10.1 fL (ref 7.5–12.5)
Monocytes Absolute: 648 cells/uL (ref 200–950)
NEUTROS ABS: 3744 {cells}/uL (ref 1500–7800)
Neutrophils Relative %: 52 %
PLATELETS: 284 10*3/uL (ref 140–400)
RBC: 4.36 MIL/uL (ref 4.20–5.80)
RDW: 13.2 % (ref 11.0–15.0)
WBC: 7.2 10*3/uL (ref 3.8–10.8)

## 2016-03-21 LAB — BASIC METABOLIC PANEL
BUN: 11 mg/dL (ref 7–25)
CHLORIDE: 100 mmol/L (ref 98–110)
CO2: 26 mmol/L (ref 20–31)
CREATININE: 0.86 mg/dL (ref 0.70–1.25)
Calcium: 9.2 mg/dL (ref 8.6–10.3)
GLUCOSE: 109 mg/dL — AB (ref 65–99)
POTASSIUM: 4.2 mmol/L (ref 3.5–5.3)
Sodium: 138 mmol/L (ref 135–146)

## 2016-03-21 LAB — LIPID PANEL
Cholesterol: 218 mg/dL — ABNORMAL HIGH (ref 125–200)
HDL: 47 mg/dL (ref >=40)
LDL CALC: 148 mg/dL — AB (ref <130)
Total CHOL/HDL Ratio: 4.6 Ratio (ref <=5.0)
Triglycerides: 116 mg/dL (ref <150)
VLDL: 23 mg/dL (ref <30)

## 2016-03-21 NOTE — Assessment & Plan Note (Addendum)
immunizations: Tdap 2014 ;  pnm shot 2014 ; prevnar-- 2015 ; zostavax  2014  CCS: cscope 01-2012, bx neg per old records, next per GI, 5 years? Prostate ca screening: Sees Dr Felipa Eth (urology) regularly Lung cancer screening: Discussed, will arrange  diet-exercise discussed  Labs: BMP, FLP, CBC, vitamin D, UA, urine culture, HIV, hep C

## 2016-03-21 NOTE — Patient Instructions (Signed)
Get your blood work before you leave   Next visit 6 months     Fall Prevention and [place] cause injuries and can affect all age groups. It is possible to use preventive measures to significantly decrease the likelihood of falls. There are many simple measures which can make your home safer and prevent falls. OUTDOORS  Repair cracks and edges of walkways and driveways.  Remove high doorway thresholds.  Trim shrubbery on the main path into your home.  Have good outside lighting.  Clear walkways of tools, rocks, debris, and clutter.  Check that handrails are not broken and are securely fastened. Both sides of steps should have handrails.  Have leaves, snow, and ice cleared regularly.  Use sand or salt on walkways during winter months.  In the garage, clean up grease or oil spills. BATHROOM  Install night lights.  Install grab bars by the toilet and in the tub and shower.  Use non-skid mats or decals in the tub or shower.  Place a plastic non-slip stool in the shower to sit on, if needed.  Keep floors dry and clean up all water on the floor immediately.  Remove soap buildup in the tub or shower on a regular basis.  Secure bath mats with non-slip, double-sided rug tape.  Remove throw rugs and tripping hazards from the floors. BEDROOMS  Install night lights.  Make sure a bedside light is easy to reach.  Do not use oversized bedding.  Keep a telephone by your bedside.  Have a firm chair with side arms to use for getting dressed.  Remove throw rugs and tripping hazards from the floor. KITCHEN  Keep handles on pots and pans turned toward the center of the stove. Use back burners when possible.  Clean up spills quickly and allow time for drying.  Avoid walking on wet floors.  Avoid hot utensils and knives.  Position shelves so they are not too high or low.  Place commonly used objects within easy reach.  If necessary, use a sturdy step stool  with a grab bar when reaching.  Keep electrical cables out of the way.  Do not use floor polish or wax that makes floors slippery. If you must use wax, use non-skid floor wax.  Remove throw rugs and tripping hazards from the floor. STAIRWAYS  Never leave objects on stairs.  Place handrails on both sides of stairways and use them. Fix any loose handrails. Make sure handrails on both sides of the stairways are as long as the stairs.  Check carpeting to make sure it is firmly attached along stairs. Make repairs to worn or loose carpet promptly.  Avoid placing throw rugs at the top or bottom of stairways, or properly secure the rug with carpet tape to prevent slippage. Get rid of throw rugs, if possible.  Have an electrician put in a light switch at the top and bottom of the stairs. OTHER FALL PREVENTION TIPS  Wear low-heel or rubber-soled shoes that are supportive and fit well. Wear closed toe shoes.  When using a stepladder, make sure it is fully opened and both spreaders are firmly locked. Do not climb a closed stepladder.  Add color or contrast paint or tape to grab bars and handrails in your home. Place contrasting color strips on first and last steps.  Learn and use mobility aids as needed. Install an electrical emergency response system.  Turn on lights to avoid dark areas. Replace light bulbs that burn out immediately. Get  Get light switches that glow.  Arrange furniture to create clear pathways. Keep furniture in the same place.  Firmly attach carpet with non-skid or double-sided tape.  Eliminate uneven floor surfaces.  Select a carpet pattern that does not visually hide the edge of steps.  Be aware of all pets. OTHER HOME SAFETY TIPS  Set the water temperature for 120 F (48.8 C).  Keep emergency numbers on or near the telephone.  Keep smoke detectors on every level of the home and near sleeping areas. Document Released: 05/30/2002 Document Revised: 12/09/2011  Document Reviewed: 08/29/2011 ExitCare Patient Information 2015 [employer]. This information is not intended to replace advice given to you by your health care provider. Make sure you discuss any questions you have with your health care provider.   Preventive Care for Adults Ages 65 and over  Blood pressure check.** / Every 1 to 2 years.  Lipid and cholesterol check.**/ Every 5 years beginning at age 20.  Lung cancer screening. / Every year if you are aged 55-80 years and have a 30-pack-year history of smoking and currently smoke or have quit within the past 15 years. Yearly screening is stopped once you have quit smoking for at least 15 years or develop a health problem that would prevent you from having lung cancer treatment.  Fecal occult blood test (FOBT) of stool. / Every year beginning at age 50 and continuing until age 75. You may not have to do this test if you get a colonoscopy every 10 years.  Flexible sigmoidoscopy** or colonoscopy.** / Every 5 years for a flexible sigmoidoscopy or every 10 years for a colonoscopy beginning at age 50 and continuing until age 75.  Hepatitis C blood test.** / For all people born from 1945 through 1965 and any individual with known risks for hepatitis C.  Abdominal aortic aneurysm (AAA) screening.** / A one-time screening for ages 65 to 75 years who are current or former smokers.  Skin self-exam. / Monthly.  Influenza vaccine. / Every year.  Tetanus, diphtheria, and acellular pertussis (Tdap/Td) vaccine.** / 1 dose of Td every 10 years.  Varicella vaccine.** / Consult your health care provider.  Zoster vaccine.** / 1 dose for adults aged 60 years or older.  Pneumococcal 13-valent conjugate (PCV13) vaccine.** / Consult your health care provider.  Pneumococcal polysaccharide (PPSV23) vaccine.** / 1 dose for all adults aged 65 years and older.  Meningococcal vaccine.** / Consult your health care provider.  Hepatitis A vaccine.** /  Consult your health care provider.  Hepatitis B vaccine.** / Consult your health care provider.  Haemophilus influenzae type b (Hib) vaccine.** / Consult your health care provider. **Family history and personal history of risk and conditions may change your health care provider's recommendations. Document Released: 08/05/2001 Document Revised: 06/14/2013 Document Reviewed: 11/04/2010 ExitCare Patient Information 2015 [employer]. This information is not intended to replace advice given to you by your health care provider. Make sure you discuss any questions you have with your health care provider.   

## 2016-03-21 NOTE — Progress Notes (Signed)
Subjective:    Patient ID: Robert Gibbs, male    DOB: [birth year], 65 y.o.   MRN: [id number]  DOS:  03/21/2016 Type of visit - description :  Here for Medicare AWV: 1. Risk factors based on Past M, S, F history: reviewed 2. Physical Activities:  sedentary 3. Depression/mood:  On meds, sx controlled, no suicidal  4. Hearing:  Mild diff hearing, no tinnitus , declined referral  5. ADL's: independent, drives  6. Fall Risk: no recent falls, prevention discussed , see AVS 7. home Safety: does feel safe at home  8. Height, weight, & visual acuity: see VS, sees eye doctor regulalrly 9. Counseling: provided 10. Labs ordered based on risk factors: if needed  11. Referral Coordination: if needed 12. Care Plan, see assessment and plan , written personalized plan provided , see AVS 13. Cognitive Assessment: motor skills and cognition appropriate for age 23. Care team updated today  15. End-of-life care discussed HC POA  In addition, today we discussed the following: Chronic back pain: On gabapentin, symptoms not well-controlled HTN: Good medication compliance without apparent side effects Vitamin D: S/P ergocalciferol, on OTCs  Review of Systems Constitutional: No fever. No chills. No unexplained wt changes. No unusual sweats  HEENT: No dental problems, no ear discharge, no facial swelling, no voice changes. No eye discharge, no eye  redness , no  intolerance to light   Respiratory: No wheezing , no  difficulty breathing. No cough , no mucus production  Cardiovascular: No CP, no palpitations. + Leg swelling, at baseline, not taking Lasix  GI: no nausea, no vomiting, no diarrhea , no  abdominal pain.  No blood in the stools. No dysphagia, no odynophagia    Endocrine: No polyphagia, no polyuria , no polydipsia  GU: Has chronic urinary symptoms, increase for the last few weeks, UTI? No fever or chills.  Musculoskeletal: No joint swellings or unusual aches or pains  Skin: No change  in the color of the skin, palor , no  Rash  Allergic, immunologic: No environmental allergies , no  food allergies  Neurological: No dizziness no  syncope. No headaches. No diplopia, no slurred, no slurred speech, no motor deficits, no facial  Numbness  Hematological: No enlarged lymph nodes, no easy bruising , no unusual bleedings  Psychiatry: No suicidal ideas, no hallucinations, no beavior problems, no confusion.  No unusual/severe anxiety, no depression   Past Medical History:  Diagnosis Date  . Anxiety and depression   . Arthritis   . Barrett esophagus   . BPH (benign prostatic hyperplasia)   . CTS (carpal tunnel syndrome)    h/o  . Edema of both legs    legs , ankles and feet  . Erectile dysfunction   . GERD (gastroesophageal reflux disease) 03/18/2012  . H/O hiatal hernia   . HTN (hypertension)   . Hyperlipidemia   . Microscopic hematuria   . OSA (obstructive sleep apnea) 03/18/2012   on BiPap  . Prostatitis   . PVD (peripheral vascular disease) ([place])   . Spinal stenosis 06-2012   saw neuro w/ "neuropathy", dx w/ spinal stenosis  . Varicose veins   . Vitamin D deficiency   . Wears glasses     Past Surgical History:  Procedure Laterality Date  . ARCH BAR REMOVAL    . BACK SURGERY  2002   L4-L5  . [place]   for tennis elbow-rt  . ESOPHAGOGASTRODUODENOSCOPY  04/2015   w/ biopsy, short tongue  of Barrett's without obvious nodularity, mass or esophagitis seen  . EYE SURGERY Right    cataract with lens implant  . FOOT SURGERY     R, for plantar fasciitis  . HERNIA REPAIR     x 2 as a child-ing   . KNEE ARTHROSCOPY WITH MENISCAL REPAIR Bilateral 05/04/2013   Procedure: BILATERAL KNEE ARTHROSCOPY WITH POSSIBLE CHONDROPLASTY AND MENISCECTOMY ;  Surgeon: Kerin Salen, MD;  Location: [facility];  Service: Orthopedics;  Laterality: Bilateral;  Right chrondroplasty, debridement, , partial medial menisectomy, removal of loose bodies.   Marland Kitchen  LIGATION / DIVISION SAPHENOUS VEIN     both legs  . PROSTATE BIOPSY     2010, (-)  . TONSILLECTOMY    . TOTAL KNEE ARTHROPLASTY Right 11/07/2013   Procedure: TOTAL KNEE ARTHROPLASTY;  Surgeon: Kerin Salen, MD;  Location: [facility] [place];  Service: Orthopedics;  Laterality: Right;  . TOTAL KNEE ARTHROPLASTY Left 03/01/2014   Procedure: TOTAL KNEE ARTHROPLASTY;  Surgeon: Kerin Salen, MD;  Location: [facility];  Service: Orthopedics;  Laterality: Left;  . UPPER GASTROINTESTINAL ENDOSCOPY    . UVULOPALATOPHARYNGOPLASTY (UPPP)/TONSILLECTOMY/SEPTOPLASTY  1990    Social History   Social History  . Marital status: Divorced    Spouse name: N/A  . Number of children: 1  . Years of education: N/A   Occupational History  . retired     Social History Main Topics  . Smoking status: Former Smoker    Packs/day: 1.00    Years: 40.00    Start date: 1972    Quit date: 06/29/2008  . Smokeless tobacco: Never Used     Comment: quit 2010  . Alcohol use 1.5 oz/week    3 Standard drinks or equivalent per week     Comment: 2-3 large drinks, Vodka, 2-3 times a week  . Drug use: No  . Sexual activity: Not on file   Other Topics Concern  . Not on file   Social History Narrative   Lives w/  fiance of > 20 years             Family History  Problem Relation Age of Onset  . CAD Father     ?  Marland Kitchen Alzheimer's disease Mother   . Colon cancer Neg Hx   . Prostate cancer Neg Hx   . Diabetes Neg Hx   . Cancer Neg Hx        Medication List       Accurate as of 03/21/16  5:43 PM. Always use your most recent med list.          aspirin 81 MG tablet Take 81 mg by mouth daily.   b complex vitamins capsule Take 1 capsule by mouth daily.   buPROPion 300 MG 24 hr tablet Commonly known as:  WELLBUTRIN XL Take 1 tablet (300 mg total) by mouth daily.   CALCIUM 1200 1200-1000 MG-UNIT Chew Chew 1 tablet by mouth daily.   Coenzyme Q10 200 MG capsule Take 200 mg by mouth daily.   escitalopram 20 MG  tablet Commonly known as:  LEXAPRO Take 1 tablet (20 mg total) by mouth daily.   FLEX OMEGA BENEFITS/VIT D-3 PO Take 2 capsules by mouth daily.   gabapentin 800 MG tablet Commonly known as:  NEURONTIN Take 1 tablet (800 mg total) by mouth 4 (four) times daily.   lisinopril 40 MG tablet Commonly known as:  PRINIVIL,ZESTRIL Take 1 tablet (40 mg total) by mouth daily.  magnesium oxide 400 MG tablet Commonly known as:  MAG-OX Take 400 mg by mouth daily.   Melatonin 5 MG Caps Take 10 mg by mouth at bedtime. Patient stated that he is taking 10 mg daily   metoprolol succinate 100 MG 24 hr tablet Commonly known as:  TOPROL-XL Take 1 tablet (100 mg total) by mouth daily.   multivitamin with minerals tablet Take 1 tablet by mouth daily.   NASACORT ALLERGY 24HR 55 MCG/ACT Aero nasal inhaler Generic drug:  triamcinolone Place 2 sprays into the nose at bedtime.   omeprazole 40 MG capsule Commonly known as:  PRILOSEC Take 1 capsule (40 mg total) by mouth daily.   vitamin C 1000 MG tablet Take 1,000 mg by mouth daily.   Vitamin D (Ergocalciferol) 50000 units Caps capsule Commonly known as:  DRISDOL Take 1 capsule (50,000 Units total) by mouth every 7 (seven) days. For 3 months.   zolpidem 10 MG tablet Commonly known as:  AMBIEN Take 0.5-1 tablets (5-10 mg total) by mouth at bedtime.          Objective:   Physical Exam BP 126/80 (BP Location: Left Arm, Patient Position: Sitting, Cuff Size: Normal)   Pulse 68   Temp 98.1 F (36.7 C) (Oral)   Resp 14   Ht 5\' 10"  (1.778 m)   Wt (!) 310 lb 4 oz (140.7 kg)   SpO2 97%   BMI 44.52 kg/m   General:   Well developed, well nourished . NAD.  Neck: No  thyromegaly  HEENT:  Normocephalic . Face symmetric, atraumatic Lungs:  CTA B Normal respiratory effort, no intercostal retractions, no accessory muscle use. Heart: RRR,  no murmur.  +/+++ pretibial edema bilaterally  Abdomen:  Not distended, soft, non-tender. No rebound  or rigidity.  + Diastasis recti Skin: Exposed areas without rash. Not pale. Not jaundice Neurologic:  alert & oriented X3.  Speech normal, gait appropriate for age and unassisted Strength symmetric and appropriate for age.  Psych: Cognition and judgment appear intact.  Cooperative with normal attention span and concentration.  Behavior appropriate. No anxious or depressed appearing.    Assessment & Plan:   Assessment HTN Hyperlipidemia  Chronic lower extremity edemaWith some pretibial redness  (ER 2-17, [place] neg DVT, BNP wnl) anxiety depression  GI: GERD, Barrett's  esophagus OSA, BiPAP, per pulmonary GU: Dr Felipa Eth  --LUTS , failed a # of medications --BPH, elevated PSA:  (-)  biopsy 2009, PSA 05/2014 2.92 --ED --Hematuria, CT, cystoscopy 2009 negative Vitamin D deficiency-  MSK: --Dx w/ Spinal stenosis after he saw neurology (no neuropathy, see OV note 08-07-2012), used to see pain med (Dr Vira Blanco) --CTS Diastasis recti  PLAN HTN: Continue lisinopril, Toprol. Check a BMP. Hyperlipidemia: Diet control, check a FLP. Chronic lower extremity edema: Does not like to take Lasix because makehis urinary frequency worse. Then his treatment options are weight loss, low-salt diet, leg elevation and compression stockings Anxiety and depression: Well-controlled with Lexapro and Wellbutrin however would like to take something like Cymbalta which could help with chronic back pain. Iagree, will have to transition him gradually, no guarantee that he will get better. Pt states will think about it. I also suggested lyrica, states he couldn't affor it before. Back pain, spinal stenosis, no neuropathy:see above  Anemia: CBC show low hemoglobin when he went to the ER a few months ago. Recheck Low vitamin D. Labs , on OTCs UTI? Check a UA, urine culture. If negative needs to see urology RTC  6 months    Today, I spent more than 30   min with the patient (in addition to his Medicare wellness),>50%  of the time counseling regards lower extremity edema management, possibly change therapy for anxiety, depression, reviewing the chart including labs done at the ER a few months ago, addressing a number of questions from the patient.

## 2016-03-21 NOTE — Progress Notes (Signed)
Pre visit review using our clinic review tool, if applicable. No additional management support is needed unless otherwise documented below in the visit note. 

## 2016-03-21 NOTE — Assessment & Plan Note (Signed)
HTN: Continue lisinopril, Toprol. Check a BMP. Hyperlipidemia: Diet control, check a FLP. Chronic lower extremity edema: Does not like to take Lasix because makehis urinary frequency worse. Then his treatment options are weight loss, low-salt diet, leg elevation and compression stockings Anxiety and depression: Well-controlled with Lexapro and Wellbutrin however would like to take something like Cymbalta which could help with chronic back pain. Iagree, will have to transition him gradually, no guarantee that he will get better. Pt states will think about it. I also suggested lyrica, states he couldn't affor it before. Back pain, spinal stenosis, no neuropathy:see above  Anemia: CBC show low hemoglobin when he went to the ER a few months ago. Recheck Low vitamin D. Labs , on OTCs UTI? Check a UA, urine culture. If negative needs to see urology RTC 6 months

## 2016-03-22 LAB — URINALYSIS, ROUTINE W REFLEX MICROSCOPIC
BILIRUBIN URINE: NEGATIVE
Glucose, UA: NEGATIVE
KETONES UR: NEGATIVE
Leukocytes, UA: NEGATIVE
Nitrite: NEGATIVE
PH: 6 (ref 5.0–8.0)
Protein, ur: NEGATIVE
SPECIFIC GRAVITY, URINE: 1.018 (ref 1.001–1.035)

## 2016-03-22 LAB — URINALYSIS, MICROSCOPIC ONLY
BACTERIA UA: NONE SEEN [HPF]
CASTS: NONE SEEN [LPF]
CRYSTALS: NONE SEEN [HPF]
Squamous Epithelial / LPF: NONE SEEN [HPF] (ref <=5)
WBC, UA: NONE SEEN WBC/HPF (ref <=5)
YEAST: NONE SEEN [HPF]

## 2016-03-22 LAB — HEPATITIS C ANTIBODY: HCV Ab: NEGATIVE

## 2016-03-22 LAB — HIV ANTIBODY (ROUTINE TESTING W REFLEX): HIV: NONREACTIVE

## 2016-03-23 LAB — URINE CULTURE: ORGANISM ID, BACTERIA: NO GROWTH

## 2016-03-24 ENCOUNTER — Telehealth: Payer: Self-pay | Admitting: Internal Medicine

## 2016-03-24 LAB — VITAMIN D 1,25 DIHYDROXY
VITAMIN D 1, 25 (OH) TOTAL: 57 pg/mL (ref 18–72)
VITAMIN D3 1, 25 (OH): 57 pg/mL
Vitamin D2 1, 25 (OH)2: <8 pg/mL

## 2016-03-24 NOTE — Telephone Encounter (Signed)
Relation to [id number] Call back [phone]   Reason for call:  Patient inquiring about lab results.

## 2016-03-24 NOTE — Telephone Encounter (Signed)
Notes Recorded by Damita Dunnings, CMA on 03/24/2016 at 3:13 PM EDT Results released to [facility]. ------  Notes Recorded by Colon Branch, MD on 03/24/2016 at 3:08 PM EDT Release these results to MyChart with attached comments  Urine culture showed no infection. Anemia resolved. Blood sugar slightly elevated. Cholesterol has increased compared to last year, needs to watch diet more closely.

## 2016-03-24 NOTE — Telephone Encounter (Signed)
Please advise 

## 2016-03-24 NOTE — Telephone Encounter (Signed)
I just commented on results, please release them

## 2016-03-26 ENCOUNTER — Ambulatory Visit (HOSPITAL_BASED_OUTPATIENT_CLINIC_OR_DEPARTMENT_OTHER): Payer: Medicare Other

## 2016-03-28 ENCOUNTER — Ambulatory Visit (HOSPITAL_BASED_OUTPATIENT_CLINIC_OR_DEPARTMENT_OTHER)
Admission: RE | Admit: 2016-03-28 | Discharge: 2016-03-28 | Disposition: A | Discharge status: Home / Self Care | Payer: Medicare Other | Source: Ambulatory Visit | Attending: Internal Medicine | Admitting: Internal Medicine

## 2016-03-28 DIAGNOSIS — J439 Emphysema, unspecified: Secondary | ICD-10-CM | POA: Diagnosis not present

## 2016-03-28 DIAGNOSIS — I2510 Atherosclerotic heart disease of native coronary artery without angina pectoris: Secondary | ICD-10-CM | POA: Insufficient documentation

## 2016-03-28 DIAGNOSIS — Z87891 Personal history of nicotine dependence: Secondary | ICD-10-CM | POA: Diagnosis not present

## 2016-04-09 ENCOUNTER — Other Ambulatory Visit: Payer: Self-pay | Admitting: Internal Medicine

## 2016-04-18 ENCOUNTER — Ambulatory Visit: Payer: Medicare Other | Admitting: Family Medicine

## 2016-04-21 ENCOUNTER — Ambulatory Visit (INDEPENDENT_AMBULATORY_CARE_PROVIDER_SITE_OTHER): Payer: Medicare Other | Admitting: Family Medicine

## 2016-04-21 ENCOUNTER — Ambulatory Visit (HOSPITAL_BASED_OUTPATIENT_CLINIC_OR_DEPARTMENT_OTHER)
Admission: RE | Admit: 2016-04-21 | Discharge: 2016-04-21 | Disposition: A | Discharge status: Home / Self Care | Payer: Medicare Other | Source: Ambulatory Visit | Attending: Family Medicine | Admitting: Family Medicine

## 2016-04-21 ENCOUNTER — Encounter: Payer: Self-pay | Admitting: Family Medicine

## 2016-04-21 VITALS — BP 148/82 | HR 63 | Temp 98.0°F | Resp 20 | Ht 70.0 in | Wt 310.6 lb

## 2016-04-21 DIAGNOSIS — M546 Pain in thoracic spine: Secondary | ICD-10-CM

## 2016-04-21 DIAGNOSIS — R1032 Left lower quadrant pain: Secondary | ICD-10-CM | POA: Diagnosis not present

## 2016-04-21 DIAGNOSIS — S79912A Unspecified injury of left hip, initial encounter: Secondary | ICD-10-CM | POA: Diagnosis not present

## 2016-04-21 MED ORDER — TRAMADOL HCL 50 MG PO TABS: 50.0000 mg | ORAL_TABLET | Freq: Three times a day (TID) | ORAL | 0 refills | Status: DC | PRN

## 2016-04-21 NOTE — Progress Notes (Signed)
Pre visit review using our clinic review tool, if applicable. No additional management support is needed unless otherwise documented below in the visit note. 

## 2016-04-21 NOTE — Progress Notes (Signed)
Musculoskeletal Exam  Patient: Robert Gibbs DOB: 12-18-1950  DOS: 04/21/2016  SUBJECTIVE:  Chief Complaint:   Chief Complaint  Patient presents with  . Back Pain    Pt reports fall doing yard work 3 weeks ago. Has had right abdomen and lower back pain since the fall. Also has pain in left groin.    PAO GHAN is a 65 y.o. male for evaluation and treatment of back pain.   Onset:  3 weeks ago. Fell down, does not remember if he jerked his body or landed on affected areas.  Location: R side of mid back, R anterior chest wall and L groin Character:  sharp  Progression of issue:  is unchanged Associated symptoms: None- denies bruising or swelling Treatment: to date has been OTC NSAIDS.   Neurovascular symptoms: no Has B/l knee replacements, most recently was 05/08/15 and has had 5 falls since then. He describes these falls as being uneasy on his feet.  ROS: Musculoskeletal/Extremities: +back pain Neurologic: no numbness, tingling no weakness   Past Medical History:  Diagnosis Date  . Anxiety and depression   . Arthritis   . Barrett esophagus   . BPH (benign prostatic hyperplasia)   . CTS (carpal tunnel syndrome)    h/o  . Edema of both legs    legs , ankles and feet  . Erectile dysfunction   . GERD (gastroesophageal reflux disease) 03/18/2012  . H/O hiatal hernia   . HTN (hypertension)   . Hyperlipidemia   . Microscopic hematuria   . OSA (obstructive sleep apnea) 03/18/2012   on BiPap  . Prostatitis   . PVD (peripheral vascular disease) ([place])   . Spinal stenosis 06-2012   saw neuro w/ "neuropathy", dx w/ spinal stenosis  . Varicose veins   . Vitamin D deficiency   . Wears glasses    Past Surgical History:  Procedure Laterality Date  . ARCH BAR REMOVAL    . BACK SURGERY  2002   L4-L5  . [place]   for tennis elbow-rt  . ESOPHAGOGASTRODUODENOSCOPY  04/2015   w/ biopsy, short tongue of Barrett's without obvious nodularity, mass or esophagitis  seen  . EYE SURGERY Right    cataract with lens implant  . FOOT SURGERY     R, for plantar fasciitis  . HERNIA REPAIR     x 2 as a child-ing   . KNEE ARTHROSCOPY WITH MENISCAL REPAIR Bilateral 05/04/2013   Procedure: BILATERAL KNEE ARTHROSCOPY WITH POSSIBLE CHONDROPLASTY AND MENISCECTOMY ;  Surgeon: Kerin Salen, MD;  Location: [facility];  Service: Orthopedics;  Laterality: Bilateral;  Right chrondroplasty, debridement, , partial medial menisectomy, removal of loose bodies.   Marland Kitchen LIGATION / DIVISION SAPHENOUS VEIN     both legs  . PROSTATE BIOPSY     2010, (-)  . TONSILLECTOMY    . TOTAL KNEE ARTHROPLASTY Right 11/07/2013   Procedure: TOTAL KNEE ARTHROPLASTY;  Surgeon: Kerin Salen, MD;  Location: [facility];  Service: Orthopedics;  Laterality: Right;  . TOTAL KNEE ARTHROPLASTY Left 03/01/2014   Procedure: TOTAL KNEE ARTHROPLASTY;  Surgeon: Kerin Salen, MD;  Location: [facility];  Service: Orthopedics;  Laterality: Left;  . UPPER GASTROINTESTINAL ENDOSCOPY    . UVULOPALATOPHARYNGOPLASTY (UPPP)/TONSILLECTOMY/SEPTOPLASTY  1990   Family History  Problem Relation Age of Onset  . CAD Father     ?  Marland Kitchen Alzheimer's disease Mother   . Colon cancer Neg Hx   . Prostate cancer Neg  Hx   . Diabetes Neg Hx   . Cancer Neg Hx    Current Outpatient Prescriptions  Medication Sig Dispense Refill  . Ascorbic Acid (VITAMIN C) 1000 MG tablet Take 1,000 mg by mouth daily.    Marland Kitchen aspirin 81 MG tablet Take 81 mg by mouth daily.    Marland Kitchen b complex vitamins capsule Take 1 capsule by mouth daily.    Marland Kitchen buPROPion (WELLBUTRIN XL) 300 MG 24 hr tablet Take 1 tablet (300 mg total) by mouth daily. 90 tablet 1  . Calcium Carbonate-Vit D-Min (CALCIUM 1200) 1200-1000 MG-UNIT CHEW Chew 1 tablet by mouth daily.     . Coenzyme Q10 200 MG capsule Take 200 mg by mouth daily.    Marland Kitchen escitalopram (LEXAPRO) 20 MG tablet Take 1 tablet (20 mg total) by mouth daily. 90 tablet 1  . gabapentin (NEURONTIN) 800 MG tablet Take 1  tablet (800 mg total) by mouth 4 (four) times daily. 360 tablet 0  . lisinopril (PRINIVIL,ZESTRIL) 40 MG tablet Take 1 tablet (40 mg total) by mouth daily. 90 tablet 3  . magnesium oxide (MAG-OX) 400 MG tablet Take 400 mg by mouth daily.    . Melatonin 5 MG CAPS Take 10 mg by mouth at bedtime. Patient stated that he is taking 10 mg daily    . metoprolol succinate (TOPROL-XL) 100 MG 24 hr tablet Take 1 tablet (100 mg total) by mouth daily. 90 tablet 1  . Multiple Vitamins-Minerals (MULTIVITAMIN WITH MINERALS) tablet Take 1 tablet by mouth daily.    . Omega-3 Fat Ac-Cholecalciferol (FLEX OMEGA BENEFITS/VIT D-3 PO) Take 2 capsules by mouth daily.    Marland Kitchen omeprazole (PRILOSEC) 40 MG capsule Take 1 capsule (40 mg total) by mouth daily. 180 capsule 1  . triamcinolone (NASACORT ALLERGY 24HR) 55 MCG/ACT AERO nasal inhaler Place 2 sprays into the nose at bedtime.    Marland Kitchen zolpidem (AMBIEN) 10 MG tablet Take 0.5-1 tablets (5-10 mg total) by mouth at bedtime. 90 tablet 0  . traMADol (ULTRAM) 50 MG tablet Take 1 tablet (50 mg total) by mouth every 8 (eight) hours as needed for moderate pain or severe pain. 30 tablet 0   Allergies  Allergen Reactions  . Codeine Itching  . Nsaids     Acid Reflux  . Simvastatin     Mimics heart attack  . Tolmetin     Acid Reflux   Social History   Social History  . Marital status: Divorced  . Number of children: 1   Occupational History  . retired     Social History Main Topics  . Smoking status: Former Smoker    Packs/day: 1.00    Years: 40.00    Start date: 1972    Quit date: 06/29/2008  . Smokeless tobacco: Never Used     Comment: quit 2010  . Alcohol use 1.5 oz/week    3 Standard drinks or equivalent per week     Comment: 2-3 large drinks, Vodka, 2-3 times a week  . Drug use: No   Social History Narrative   Lives w/  fiance of > 20 years            Objective: VITAL SIGNS: BP (!) 148/82 (BP Location: Right Arm, Patient Position: Sitting, Cuff Size:  Large)   Pulse 63   Temp 98 F (36.7 C) (Oral)   Resp 20   Ht 5\' 10"  (1.778 m)   Wt (!) 310 lb 9.6 oz (140.9 kg)   SpO2 96% Comment:  ra  BMI 44.57 kg/m  Constitutional: Well formed, well developed. No acute distress. Cardiovascular: RRR, no murmurs Thorax & Lungs: No accessory muscle use, CTAB Extremities: No clubbing. No cyanosis. No edema.  Skin: Warm. Dry. No erythema. No rash.  Musculoskeletal: L hip.   Normal active range of motion: yes.   Normal passive range of motion: yes Tenderness to palpation: yes Deformity: no Ecchymosis: no Tests positive: Stinchfield Tests negative: Log roll, Patricks, FADDIR TTP over the R thoracic erector spinae group There is also TTP over the rib cage on R anterior axillary line, R 6-7 Neurologic: Normal sensory function. No focal deficits noted. DTR's equal and symmetric in LE's. No clonus. Psychiatric: Normal mood. Age appropriate judgment and insight. Alert & oriented x 3.    Assessment:  Acute right-sided thoracic back pain - Plan: traMADol (ULTRAM) 50 MG tablet  Groin pain, left - Plan: DG HIP UNILAT WITH PELVIS 2-3 VIEWS LEFT, traMADol (ULTRAM) 50 MG tablet  Plan: Orders as above. Likely msk in etiology. Probably jolted and strained muscles when he fell. Will ensure there is no hip pathosis with XR. Discussed XR of ribs and how it would not change management. I told him that he is to leave assuming his XR is going to be unremarkable. If any abnormalities arise, we will contact him. Tylenol and Aleve prn. Tramadol for breakthrough pain. Discussed considering PT for his instability. Will think about it and discuss with Dr. Larose Kells. I'm OK with BP given age. The patient voiced understanding and agreement to the plan.   [place], DO 04/21/16  2:38 PM

## 2016-05-02 ENCOUNTER — Telehealth: Payer: Self-pay | Admitting: Internal Medicine

## 2016-05-02 NOTE — Telephone Encounter (Addendum)
Relation to [id number] Call back number:[phone]   Reason for call:  Patient states symptoms from last 04/21/16 appointment didn't improve, requesting imaging orders to check upper extremities, patient states body is sore, please advise.

## 2016-05-05 ENCOUNTER — Ambulatory Visit: Payer: Medicare Other | Admitting: Internal Medicine

## 2016-05-05 NOTE — Telephone Encounter (Signed)
Patient declined appointment and states over the weekend he started to feel better.

## 2016-05-05 NOTE — Telephone Encounter (Signed)
Yea, I want to see him. Thanks.

## 2016-05-05 NOTE — Telephone Encounter (Signed)
Does patient need to be scheduled for evaluation?

## 2016-05-06 ENCOUNTER — Telehealth: Payer: Self-pay | Admitting: Internal Medicine

## 2016-05-06 MED ORDER — GABAPENTIN 800 MG PO TABS: 800.0000 mg | ORAL_TABLET | Freq: Four times a day (QID) | ORAL | 1 refills | Status: DC

## 2016-05-06 NOTE — Telephone Encounter (Signed)
Gabapentin Rx sent. Lisinopril Rx sent on 04/09/2016.

## 2016-05-06 NOTE — Telephone Encounter (Signed)
Relation to [id number] Call back [place]: [id number] - [place], [place] [phone] ([phone] (Fax)     Reason for call:  Patient requesting gabapentin (NEURONTIN) 800 MG tablet and lisinopril (PRINIVIL,ZESTRIL) 40 MG tablet

## 2016-05-08 ENCOUNTER — Telehealth: Payer: Self-pay | Admitting: Internal Medicine

## 2016-05-08 ENCOUNTER — Other Ambulatory Visit: Payer: Self-pay | Admitting: Internal Medicine

## 2016-05-08 MED ORDER — METOPROLOL SUCCINATE ER 100 MG PO TB24: 100.0000 mg | ORAL_TABLET | Freq: Every day | ORAL | 1 refills | Status: DC

## 2016-05-08 MED ORDER — ESCITALOPRAM OXALATE 20 MG PO TABS: 20.0000 mg | ORAL_TABLET | Freq: Every day | ORAL | 1 refills | Status: DC

## 2016-05-08 NOTE — Telephone Encounter (Signed)
Pt is requesting refill on Ambien.  Last OV: 03/21/2016 Last Fill: 01/08/2016 #90 and 0RF UDS: Not needed for Ambien per PCP   Please advise.

## 2016-05-08 NOTE — Telephone Encounter (Signed)
Pharmacy: [id number] - [place], [place]  Reason for call: pt called for refills on escitalopram, metoprolol, and ambien. He had refills for 2 at [facility] order but [facility]. Pt said he hasn't had ambien in a while.

## 2016-05-09 MED ORDER — ZOLPIDEM TARTRATE 10 MG PO TABS: 5.0000 mg | ORAL_TABLET | Freq: Every day | ORAL | 1 refills | Status: DC

## 2016-05-09 NOTE — Telephone Encounter (Signed)
[facility]  

## 2016-05-09 NOTE — Telephone Encounter (Signed)
Rx printed, awaiting MD signature.  

## 2016-05-09 NOTE — Telephone Encounter (Signed)
Ok 90 and 1 RF 

## 2016-06-18 DIAGNOSIS — H2513 Age-related nuclear cataract, bilateral: Secondary | ICD-10-CM | POA: Diagnosis not present

## 2016-06-18 DIAGNOSIS — H2512 Age-related nuclear cataract, left eye: Secondary | ICD-10-CM | POA: Diagnosis not present

## 2016-07-02 ENCOUNTER — Telehealth: Payer: Self-pay | Admitting: Internal Medicine

## 2016-07-02 MED ORDER — OMEPRAZOLE 40 MG PO CPDR: 40.0000 mg | DELAYED_RELEASE_CAPSULE | Freq: Every day | ORAL | 1 refills | Status: DC

## 2016-07-02 NOTE — Telephone Encounter (Signed)
Relation to [id number] Call back [place]: [facility] [address] [place], [place] [address] [phone]  Reason for call:  Patient requesting a refill omeprazole (PRILOSEC) 40 MG caps DISPENSE ONLY DR'S READDY FORMULA 2 tablets daily 3 month supply

## 2016-07-02 NOTE — Telephone Encounter (Signed)
Rx sent 

## 2016-07-14 ENCOUNTER — Telehealth: Payer: Self-pay | Admitting: Internal Medicine

## 2016-07-14 MED ORDER — BUPROPION HCL ER (XL) 300 MG PO TB24: 300.0000 mg | ORAL_TABLET | Freq: Every day | ORAL | 1 refills | Status: DC

## 2016-07-14 NOTE — Telephone Encounter (Signed)
Relation to [id number] Call back [place], [place] - [address] [phone] (Phone) [phone] (Fax)     Reason for call:    Patient requesting a refill buPROPion (WELLBUTRIN XL) 300 MG 24 hr tablet

## 2016-07-14 NOTE — Telephone Encounter (Signed)
90 day supply originally sent.

## 2016-07-14 NOTE — Telephone Encounter (Signed)
Rx sent 

## 2016-07-14 NOTE — Telephone Encounter (Signed)
Patient called back requesting 90 day supply, please advise

## 2016-09-26 ENCOUNTER — Ambulatory Visit (INDEPENDENT_AMBULATORY_CARE_PROVIDER_SITE_OTHER): Payer: Medicare Other | Admitting: Internal Medicine

## 2016-09-26 ENCOUNTER — Encounter: Payer: Self-pay | Admitting: Internal Medicine

## 2016-09-26 VITALS — BP 128/70 | HR 67 | Temp 97.7°F | Resp 14 | Ht 70.0 in | Wt 313.2 lb

## 2016-09-26 DIAGNOSIS — F32A Depression, unspecified: Secondary | ICD-10-CM

## 2016-09-26 DIAGNOSIS — I10 Essential (primary) hypertension: Secondary | ICD-10-CM

## 2016-09-26 DIAGNOSIS — F418 Other specified anxiety disorders: Secondary | ICD-10-CM | POA: Diagnosis not present

## 2016-09-26 DIAGNOSIS — E785 Hyperlipidemia, unspecified: Secondary | ICD-10-CM | POA: Diagnosis not present

## 2016-09-26 DIAGNOSIS — J40 Bronchitis, not specified as acute or chronic: Secondary | ICD-10-CM | POA: Diagnosis not present

## 2016-09-26 DIAGNOSIS — F419 Anxiety disorder, unspecified: Secondary | ICD-10-CM

## 2016-09-26 DIAGNOSIS — F329 Major depressive disorder, single episode, unspecified: Secondary | ICD-10-CM

## 2016-09-26 LAB — BASIC METABOLIC PANEL
BUN: 13 mg/dL (ref 7–25)
CALCIUM: 9.2 mg/dL (ref 8.6–10.3)
CO2: 27 mmol/L (ref 20–31)
Chloride: 101 mmol/L (ref 98–110)
Creat: 0.83 mg/dL (ref 0.70–1.25)
GLUCOSE: 112 mg/dL — AB (ref 65–99)
Potassium: 4.8 mmol/L (ref 3.5–5.3)
SODIUM: 138 mmol/L (ref 135–146)

## 2016-09-26 MED ORDER — ALBUTEROL SULFATE HFA 108 (90 BASE) MCG/ACT IN AERS: 2.0000 | INHALATION_SPRAY | Freq: Four times a day (QID) | RESPIRATORY_TRACT | 1 refills | Status: DC | PRN

## 2016-09-26 MED ORDER — AZITHROMYCIN 250 MG PO TABS: ORAL_TABLET | ORAL | 0 refills | Status: DC

## 2016-09-26 NOTE — Patient Instructions (Signed)
GO TO THE LAB : Get the blood work     GO TO THE FRONT DESK Schedule your next appointment for a  physical exam September 2018  For cough: Mucinex DM OTC twice a day Take a Z-Pak (antibiotic) Lots of fluids.  If not better in the next 3-4 days: start albuterol: 2 puffs every 6 hours as needed  If not back  to normal in 3 weeks or you get worse please let us know

## 2016-09-26 NOTE — Progress Notes (Signed)
Subjective:    Patient ID: Robert Gibbs, male    DOB: [birth year], 66 y.o.   MRN: [id number]  DOS:  09/26/2016 Type of visit - description : rov Interval history:  Anxiety depression: After last visit, he decided not to change his medication. Feeling okay emotionally. Had a URI 2 months ago, still having cough and chest congestion. Still coughing occasionally thick green mucus. HTN: Good medication compliance.  Review of Systems Denies any fever chills. Occasional wheezing . Denies any history of asthma. Edema: About the same  Past Medical History:  Diagnosis Date  . Anxiety and depression   . Arthritis   . Barrett esophagus   . BPH (benign prostatic hyperplasia)   . CTS (carpal tunnel syndrome)    h/o  . Edema of both legs    legs , ankles and feet  . Erectile dysfunction   . GERD (gastroesophageal reflux disease) 03/18/2012  . H/O hiatal hernia   . HTN (hypertension)   . Hyperlipidemia   . Microscopic hematuria   . OSA (obstructive sleep apnea) 03/18/2012   on BiPap  . Prostatitis   . PVD (peripheral vascular disease) ([place])   . Spinal stenosis 06-2012   saw neuro w/ "neuropathy", dx w/ spinal stenosis  . Varicose veins   . Vitamin D deficiency   . Wears glasses     Past Surgical History:  Procedure Laterality Date  . ARCH BAR REMOVAL    . BACK SURGERY  2002   L4-L5  . [place]   for tennis elbow-rt  . ESOPHAGOGASTRODUODENOSCOPY  04/2015   w/ biopsy, short tongue of Barrett's without obvious nodularity, mass or esophagitis seen  . EYE SURGERY Right    cataract with lens implant  . FOOT SURGERY     R, for plantar fasciitis  . HERNIA REPAIR     x 2 as a child-ing   . KNEE ARTHROSCOPY WITH MENISCAL REPAIR Bilateral 05/04/2013   Procedure: BILATERAL KNEE ARTHROSCOPY WITH POSSIBLE CHONDROPLASTY AND MENISCECTOMY ;  Surgeon: Kerin Salen, MD;  Location: [place];  Service: Orthopedics;  Laterality: Bilateral;  Right chrondroplasty,  debridement, , partial medial menisectomy, removal of loose bodies.   Marland Kitchen LIGATION / DIVISION SAPHENOUS VEIN     both legs  . PROSTATE BIOPSY     2010, (-)  . TONSILLECTOMY    . TOTAL KNEE ARTHROPLASTY Right 11/07/2013   Procedure: TOTAL KNEE ARTHROPLASTY;  Surgeon: Kerin Salen, MD;  Location: [place];  Service: Orthopedics;  Laterality: Right;  . TOTAL KNEE ARTHROPLASTY Left 03/01/2014   Procedure: TOTAL KNEE ARTHROPLASTY;  Surgeon: Kerin Salen, MD;  Location: [place];  Service: Orthopedics;  Laterality: Left;  . UPPER GASTROINTESTINAL ENDOSCOPY    . UVULOPALATOPHARYNGOPLASTY (UPPP)/TONSILLECTOMY/SEPTOPLASTY  1990    Social History   Social History  . Marital status: Divorced    Spouse name: N/A  . Number of children: 1  . Years of education: N/A   Occupational History  . retired     Social History Main Topics  . Smoking status: Former Smoker    Packs/day: 1.00    Years: 40.00    Start date: 1972    Quit date: 06/29/2008  . Smokeless tobacco: Never Used     Comment: quit 2010  . Alcohol use 1.5 oz/week    3 Standard drinks or equivalent per week     Comment: 2-3 large drinks, Vodka, 2-3 times a week  . Drug use:  No  . Sexual activity: Not on file   Other Topics Concern  . Not on file   Social History Narrative   Lives w/  fiance of > 20 years              Allergies as of 09/26/2016      Reactions   Codeine Itching   Nsaids    Acid Reflux   Simvastatin    Mimics heart attack   Tolmetin    Acid Reflux      Medication List       Accurate as of 09/26/16 11:59 PM. Always use your most recent med list.          albuterol 108 (90 Base) MCG/ACT inhaler Commonly known as:  VENTOLIN HFA Inhale 2 puffs into the lungs every 6 (six) hours as needed for wheezing or shortness of breath.   aspirin 81 MG tablet Take 81 mg by mouth daily.   azithromycin 250 MG tablet Commonly known as:  ZITHROMAX Z-PAK 2 tabs a day the first day, then 1 tab a day x 4 days   b  complex vitamins capsule Take 1 capsule by mouth daily.   buPROPion 300 MG 24 hr tablet Commonly known as:  WELLBUTRIN XL Take 1 tablet (300 mg total) by mouth daily.   CALCIUM 1200 1200-1000 MG-UNIT Chew Chew 1 tablet by mouth daily.   Coenzyme Q10 200 MG capsule Take 200 mg by mouth daily.   escitalopram 20 MG tablet Commonly known as:  LEXAPRO Take 1 tablet (20 mg total) by mouth daily.   FLEX OMEGA BENEFITS/VIT D-3 PO Take 2 capsules by mouth daily.   gabapentin 800 MG tablet Commonly known as:  NEURONTIN Take 1 tablet (800 mg total) by mouth 4 (four) times daily.   lisinopril 40 MG tablet Commonly known as:  PRINIVIL,ZESTRIL Take 1 tablet (40 mg total) by mouth daily.   magnesium oxide 400 MG tablet Commonly known as:  MAG-OX Take 400 mg by mouth daily.   Melatonin 5 MG Caps Take 10 mg by mouth at bedtime. Patient stated that he is taking 10 mg daily   metoprolol succinate 100 MG 24 hr tablet Commonly known as:  TOPROL-XL Take 1 tablet (100 mg total) by mouth daily.   multivitamin with minerals tablet Take 1 tablet by mouth daily.   NASACORT ALLERGY 24HR 55 MCG/ACT Aero nasal inhaler Generic drug:  triamcinolone Place 2 sprays into the nose at bedtime.   omeprazole 40 MG capsule Commonly known as:  PRILOSEC Take 1 capsule (40 mg total) by mouth daily.   vitamin C 1000 MG tablet Take 1,000 mg by mouth daily.   zolpidem 10 MG tablet Commonly known as:  AMBIEN Take 0.5-1 tablets (5-10 mg total) by mouth at bedtime.          Objective:   Physical Exam BP 128/70 (BP Location: Left Arm, Patient Position: Sitting, Cuff Size: Normal)   Pulse 67   Temp 97.7 F (36.5 C) (Oral)   Resp 14   Ht 5\' 10"  (1.778 m)   Wt (!) 313 lb 4 oz (142.1 kg)   SpO2 98%   BMI 44.95 kg/m  General:   Well developed, well nourished . NAD.  HEENT:  Normocephalic . Face symmetric, atraumatic. TMs, throat and nose: Normal Lungs:  Few rhonchi with cough, few end  expiratory wheezes. Normal respiratory effort, no intercostal retractions, no accessory muscle use. Heart: RRR,  no murmur.  No pretibial edema bilaterally  Skin: Not pale. Not jaundice Neurologic:  alert & oriented X3.  Speech normal, gait appropriate for age and unassisted Psych--  Cognition and judgment appear intact.  Cooperative with normal attention span and concentration.  Behavior appropriate. No anxious or depressed appearing.      Assessment & Plan:  Assessment HTN Hyperlipidemia  Chronic lower extremity edemaWith some pretibial redness  (ER 2-17, [place] neg DVT, BNP wnl) anxiety depression  GI: GERD, Barrett's  esophagus OSA, BiPAP, per pulmonary GU: Dr Felipa Eth  --LUTS , failed a # of medications --BPH, elevated PSA:  (-)  biopsy 2009, PSA 05/2014 2.92 --ED --Hematuria, CT, cystoscopy 2009 negative Vitamin D deficiency-  MSK: --Dx w/ Spinal stenosis after he saw neurology (no neuropathy, see OV note 08-07-2012), used to see pain med (Dr Vira Blanco) --CTS Diastasis recti  PLAN HTN: Continue lisinopril Toprol. Check a BMP Hyperlipidemia: Last LDL 148. Recommend to watch diet more closely. Lower extremity edema: At baseline, no new recommendations Anxiety-depression: See last visit, and then he decided to stay on Lexapro and Wellbutrin did not transition to Cymbalta. He is feeling well.. Bronchitis: Having respiratory symptoms since  URI several weeks ago. Probably bronchitis. Would recommend Mucinex DM and a Zithromax. Will use albuterol for mild spasm if no better ( no history of asthma). RTC 6 months CPX.Marland Kitchen

## 2016-09-26 NOTE — Progress Notes (Signed)
Pre visit review using our clinic review tool, if applicable. No additional management support is needed unless otherwise documented below in the visit note. 

## 2016-09-27 NOTE — Assessment & Plan Note (Signed)
HTN: Continue lisinopril Toprol. Check a BMP Hyperlipidemia: Last LDL 148. Recommend to watch diet more closely. Lower extremity edema: At baseline, no new recommendations Anxiety-depression: See last visit, and then he decided to stay on Lexapro and Wellbutrin did not transition to Cymbalta. He is feeling well.. Bronchitis: Having respiratory symptoms since  URI several weeks ago. Probably bronchitis. Would recommend Mucinex DM and a Zithromax. Will use albuterol for mild spasm if no better ( no history of asthma). RTC 6 months CPX.Marland Kitchen

## 2016-10-31 ENCOUNTER — Telehealth: Payer: Self-pay

## 2016-10-31 MED ORDER — ZOLPIDEM TARTRATE 10 MG PO TABS: 5.0000 mg | ORAL_TABLET | Freq: Every day | ORAL | 1 refills | Status: DC

## 2016-10-31 NOTE — Telephone Encounter (Signed)
Rx printed, awaiting MD signature.  

## 2016-10-31 NOTE — Telephone Encounter (Signed)
Pt is requesting refill on Ambien.  Last OV: 09/26/2016 Last Fill: 04/29/2016 #90 and 1RF  UDS: Not needed for Ambien per PCP  Please advise.

## 2016-10-31 NOTE — Telephone Encounter (Signed)
Ok 90 and one refill

## 2016-10-31 NOTE — Telephone Encounter (Signed)
Rx faxed to [facility] pharmacy.  

## 2016-11-06 ENCOUNTER — Other Ambulatory Visit: Payer: Self-pay | Admitting: *Deleted

## 2016-11-06 MED ORDER — METOPROLOL SUCCINATE ER 100 MG PO TB24: 100.0000 mg | ORAL_TABLET | Freq: Every day | ORAL | 1 refills | Status: DC

## 2016-11-06 NOTE — Telephone Encounter (Signed)
Rx sent to the pharmacy by e-script.//AB/CMA 

## 2016-11-25 ENCOUNTER — Other Ambulatory Visit: Payer: Self-pay

## 2016-11-25 MED ORDER — ESCITALOPRAM OXALATE 20 MG PO TABS: 20.0000 mg | ORAL_TABLET | Freq: Every day | ORAL | 1 refills | Status: DC

## 2017-01-26 ENCOUNTER — Other Ambulatory Visit: Payer: Self-pay

## 2017-01-26 MED ORDER — GABAPENTIN 800 MG PO TABS: 800.0000 mg | ORAL_TABLET | Freq: Four times a day (QID) | ORAL | 1 refills | Status: DC

## 2017-02-02 ENCOUNTER — Other Ambulatory Visit: Payer: Self-pay | Admitting: Internal Medicine

## 2017-04-01 ENCOUNTER — Ambulatory Visit: Payer: Medicare Other | Admitting: Internal Medicine

## 2017-05-05 ENCOUNTER — Telehealth: Payer: Self-pay | Admitting: Internal Medicine

## 2017-05-05 NOTE — Telephone Encounter (Signed)
Called patient to schedule AWV. Lvm for patient to call office to schedule appt.   Last AWV 03/21/2016. Appt can be scheduled after 03/21/2017. SF

## 2017-05-09 ENCOUNTER — Other Ambulatory Visit: Payer: Self-pay | Admitting: Internal Medicine

## 2017-05-12 ENCOUNTER — Other Ambulatory Visit: Payer: Self-pay

## 2017-05-12 MED ORDER — LISINOPRIL 40 MG PO TABS: 40.0000 mg | ORAL_TABLET | Freq: Every day | ORAL | 0 refills | Status: DC

## 2017-05-13 ENCOUNTER — Telehealth: Payer: Self-pay | Admitting: Internal Medicine

## 2017-05-13 MED ORDER — ZOLPIDEM TARTRATE 10 MG PO TABS: 5.0000 mg | ORAL_TABLET | Freq: Every evening | ORAL | 0 refills | Status: DC | PRN

## 2017-05-13 NOTE — Telephone Encounter (Signed)
Rx printed, awaiting MD signature.  

## 2017-05-13 NOTE — Telephone Encounter (Signed)
Medication needs provider approval. Thanks. 

## 2017-05-13 NOTE — Telephone Encounter (Signed)
Pt is requesting refill on Ambien 10mg .  Last OV: 09/26/2016, was to f/u in 6 months, next appt scheduled in 06/2017? Last Fill: 10/31/2016 #90 and 1RF  (Pt sig: 1/2 to 1 tab qhs prn) UDS: Not needed for Ambien per PCP  NCCR printed; no issues noted  Please advise.

## 2017-05-13 NOTE — Telephone Encounter (Signed)
Okay #30, no refill, sent to local pharmacy. We will see him 06-2017 as schedule

## 2017-05-13 NOTE — Telephone Encounter (Signed)
Rx faxed to [facility] pharmacy.  

## 2017-05-13 NOTE — Telephone Encounter (Signed)
Copied from [place]. >> May 13, 2017  9:40 AM Neva Seat wrote:  Will be seeing Dr. Larose Kells needs in Jan. Will be out before the visit.    Needs approval Zolpidem 10mg   1 pill left  [facility] - [id number], [place]

## 2017-05-29 ENCOUNTER — Ambulatory Visit (INDEPENDENT_AMBULATORY_CARE_PROVIDER_SITE_OTHER): Payer: Medicare Other

## 2017-05-29 DIAGNOSIS — Z23 Encounter for immunization: Secondary | ICD-10-CM

## 2017-06-08 ENCOUNTER — Other Ambulatory Visit: Payer: Self-pay | Admitting: Internal Medicine

## 2017-06-20 ENCOUNTER — Other Ambulatory Visit: Payer: Self-pay | Admitting: Internal Medicine

## 2017-06-24 ENCOUNTER — Encounter: Payer: Self-pay | Admitting: Internal Medicine

## 2017-06-24 ENCOUNTER — Ambulatory Visit (INDEPENDENT_AMBULATORY_CARE_PROVIDER_SITE_OTHER): Payer: Medicare Other | Admitting: Internal Medicine

## 2017-06-24 VITALS — BP 128/78 | HR 59 | Temp 97.9°F | Resp 14 | Ht 70.0 in | Wt 325.5 lb

## 2017-06-24 DIAGNOSIS — Z87891 Personal history of nicotine dependence: Secondary | ICD-10-CM | POA: Diagnosis not present

## 2017-06-24 DIAGNOSIS — R739 Hyperglycemia, unspecified: Secondary | ICD-10-CM | POA: Diagnosis not present

## 2017-06-24 DIAGNOSIS — Z122 Encounter for screening for malignant neoplasm of respiratory organs: Secondary | ICD-10-CM | POA: Diagnosis not present

## 2017-06-24 DIAGNOSIS — E785 Hyperlipidemia, unspecified: Secondary | ICD-10-CM

## 2017-06-24 DIAGNOSIS — I10 Essential (primary) hypertension: Secondary | ICD-10-CM | POA: Diagnosis not present

## 2017-06-24 MED ORDER — LISINOPRIL 40 MG PO TABS: 40.0000 mg | ORAL_TABLET | Freq: Every day | ORAL | 1 refills | Status: DC

## 2017-06-24 MED ORDER — OMEPRAZOLE 40 MG PO CPDR: 40.0000 mg | DELAYED_RELEASE_CAPSULE | Freq: Every day | ORAL | 1 refills | Status: DC

## 2017-06-24 MED ORDER — ZOLPIDEM TARTRATE 10 MG PO TABS: 5.0000 mg | ORAL_TABLET | Freq: Every evening | ORAL | 1 refills | Status: DC | PRN

## 2017-06-24 MED ORDER — GABAPENTIN 800 MG PO TABS: 800.0000 mg | ORAL_TABLET | Freq: Four times a day (QID) | ORAL | 1 refills | Status: DC

## 2017-06-24 MED ORDER — METOPROLOL SUCCINATE ER 100 MG PO TB24: 100.0000 mg | ORAL_TABLET | Freq: Every day | ORAL | 1 refills | Status: DC

## 2017-06-24 NOTE — Patient Instructions (Addendum)
GO TO THE LAB : Get the blood work     GO TO THE FRONT DESK Schedule your next appointment for a checkup in 6 months  Please see your gastroenterologist regards a colonoscopy and EGD due to your history of Barrett's  Please see your urologist, I am not checking your prostate  Consider see one  of the bariatric doctors in town.       Preventing Unhealthy Weight Gain, Adult Staying at a healthy weight is important. When fat builds up in your body, you may become overweight or obese. These conditions put you at greater risk for developing certain health problems, such as heart disease, diabetes, sleeping problems, joint problems, and some cancers. Unhealthy weight gain is often the result of making unhealthy choices in what you eat. It is also a result of not getting enough exercise. You can make changes to your lifestyle to prevent obesity and stay as healthy as possible. What nutrition changes can be made? To maintain a healthy weight and prevent obesity:  Eat only as much as your body needs. To do this: ? Pay attention to signs that you are hungry or full. Stop eating as soon as you feel full. ? If you feel hungry, try drinking water first. Drink enough water so your urine is clear or pale yellow. ? Eat smaller portions. ? Look at serving sizes on food labels. Most foods contain more than one serving per container. ? Eat the recommended amount of calories for your gender and activity level. While most active people should eat around 2,000 calories per day, if you are trying to lose weight or are not very active, you main need to eat less calories. Talk to your health care provider or dietitian about how many calories you should eat each day.  Choose healthy foods, such as: ? Fruits and vegetables. Try to fill at least half of your plate at each meal with fruits and vegetables. ? Whole grains, such as whole wheat bread, brown rice, and quinoa. ? Lean meats, such as chicken or  fish. ? Other healthy proteins, such as beans, eggs, or tofu. ? Healthy fats, such as nuts, seeds, fatty fish, and olive oil. ? Low-fat or fat-free dairy.  Check food labels and avoid food and drinks that: ? Are high in calories. ? Have added sugar. ? Are high in sodium. ? Have saturated fats or trans fats.  Limit how much you eat of the following foods: ? Prepackaged meals. ? Fast food. ? Fried foods. ? Processed meat, such as bacon, sausage, and deli meats. ? Fatty cuts of red meat and poultry with skin.  Cook foods in healthier ways, such as by baking, broiling, or grilling.  When grocery shopping, try to shop around the outside of the store. This helps you buy mostly fresh foods and avoid canned and prepackaged foods.  What lifestyle changes can be made?  Exercise at least 30 minutes 5 or more days each week. Exercising includes brisk walking, yard work, biking, running, swimming, and team sports like basketball and soccer. Ask your health care provider which exercises are safe for you.  Do not use any products that contain nicotine or tobacco, such as cigarettes and e-cigarettes. If you need help quitting, ask your health care provider.  Limit alcohol intake to no more than 1 drink a day for nonpregnant women and 2 drinks a day for men. One drink equals 12 oz of beer, 5 oz of wine, or 1 oz of  hard liquor.  Try to get 7-9 hours of sleep each night. What other changes can be made?  Keep a food and activity journal to keep track of: ? What you ate and how many calories you had. Remember to count sauces, dressings, and side dishes. ? Whether you were active, and what exercises you did. ? Your calorie, weight, and activity goals.  Check your weight regularly. Track any changes. If you notice you have gained weight, make changes to your diet or activity routine.  Avoid taking weight-loss medicines or supplements. Talk to your health care provider before starting any new  medicine or supplement.  Talk to your health care provider before trying any new diet or exercise plan. Why are these changes important? Eating healthy, staying active, and having healthy habits not only help prevent obesity, they also:  Help you to manage stress and emotions.  Help you to connect with friends and family.  Improve your self-esteem.  Improve your sleep.  Prevent long-term health problems.  What can happen if changes are not made? Being obese or overweight can cause you to develop joint or bone problems, which can make it hard for you to stay active or do activities you enjoy. Being obese or overweight also puts stress on your heart and lungs and can lead to health problems like diabetes, heart disease, and some cancers. Where to find more information: Talk with your health care provider or a dietitian about healthy eating and healthy lifestyle choices. You may also find other information through these resources:  U.S. Department of Agriculture MyPlate: FormerBoss.no  American Heart Association: www.heart.org  Centers for Disease Control and Prevention: http://www.wolf.info/  Summary  Staying at a healthy weight is important. It helps prevent certain diseases and health problems, such as heart disease, diabetes, joint problems, sleep disorders, and some cancers.  Being obese or overweight can cause you to develop joint or bone problems, which can make it hard for you to stay active or do activities you enjoy.  You can prevent unhealthy weight gain by eating a healthy diet, exercising regularly, not smoking, limiting alcohol, and getting enough sleep.  Talk with your health care provider or a dietitian for guidance about healthy eating and healthy lifestyle choices. This information is not intended to replace advice given to you by your health care provider. Make sure you discuss any questions you have with your health care provider. Document Released: 06/10/2016  Document Revised: 07/16/2016 Document Reviewed: 07/16/2016 Elsevier Interactive Patient Education  [employer].

## 2017-06-24 NOTE — Assessment & Plan Note (Addendum)
-   Tdap 2014 ;  pnm shot 2014 ; prevnar-- 2015 ; zostavax  2014 ; shingrix discussed, got a flu shot  -CCS: cscope 01-2012, bx neg per old records, next per GI, 5 years?  Plans to contact GI -Prostate ca screening: Sees Dr Felipa Eth (urology) , plans to call and schedule a OV -Lung cancer screening: Negative CT 03-2016 (+ COPD).  Repeat a CT  -diet-exercise discussed -Labs: CMP, FLP, CBC, A1c, TSH

## 2017-06-24 NOTE — Progress Notes (Signed)
Subjective:    Patient ID: Robert Gibbs, male    DOB: Oct 14, 1950, 67 y.o.   MRN: [id number]  DOS:  06/24/2017 Type of visit - description : Routine visit Interval history: HTN: Good compliance with medication, no apparent side effects. Anxiety depression: Good compliance with meds, seems controlled Insomnia: On Ambien as needed. COPD per CT last year, was prescribed albuterol, never used it, continue with very mild symptoms, occasional cough and wheezing. GERD: Well controlled as long as he takes his medication Morbid obesity: Steadily gaining weight.  Wt Readings from Last 3 Encounters:  06/24/17 (!) 325 lb 8 oz (147.6 kg)  09/26/16 (!) 313 lb 4 oz (142.1 kg)  04/21/16 (!) 310 lb 9.6 oz (140.9 kg)     Review of Systems Denies chest pain or difficulty breathing.  Edema at baseline No nausea, vomiting, diarrhea or blood in the stools  Past Medical History:  Diagnosis Date  . Anxiety and depression   . Arthritis   . Barrett esophagus   . BPH (benign prostatic hyperplasia)   . CTS (carpal tunnel syndrome)    h/o  . Edema of both legs    legs , ankles and feet  . Erectile dysfunction   . GERD (gastroesophageal reflux disease) 03/18/2012  . H/O hiatal hernia   . HTN (hypertension)   . Hyperlipidemia   . Microscopic hematuria   . OSA (obstructive sleep apnea) 03/18/2012   on BiPap  . Prostatitis   . PVD (peripheral vascular disease) ([place])   . Spinal stenosis 06-2012   saw neuro w/ "neuropathy", dx w/ spinal stenosis  . Varicose veins   . Vitamin D deficiency   . Wears glasses     Past Surgical History:  Procedure Laterality Date  . ARCH BAR REMOVAL    . BACK SURGERY  2002   L4-L5  . [place]   for tennis elbow-rt  . ESOPHAGOGASTRODUODENOSCOPY  04/2015   w/ biopsy, short tongue of Barrett's without obvious nodularity, mass or esophagitis seen  . EYE SURGERY Right    cataract with lens implant  . FOOT SURGERY     R, for plantar fasciitis  . HERNIA  REPAIR     x 2 as a child-ing   . KNEE ARTHROSCOPY WITH MENISCAL REPAIR Bilateral 05/04/2013   Procedure: BILATERAL KNEE ARTHROSCOPY WITH POSSIBLE CHONDROPLASTY AND MENISCECTOMY ;  Surgeon: Kerin Salen, MD;  Location: [place];  Service: Orthopedics;  Laterality: Bilateral;  Right chrondroplasty, debridement, , partial medial menisectomy, removal of loose bodies.   Marland Kitchen LIGATION / DIVISION SAPHENOUS VEIN     both legs  . PROSTATE BIOPSY     2010, (-)  . TONSILLECTOMY    . TOTAL KNEE ARTHROPLASTY Right 11/07/2013   Procedure: TOTAL KNEE ARTHROPLASTY;  Surgeon: Kerin Salen, MD;  Location: [place];  Service: Orthopedics;  Laterality: Right;  . TOTAL KNEE ARTHROPLASTY Left 03/01/2014   Procedure: TOTAL KNEE ARTHROPLASTY;  Surgeon: Kerin Salen, MD;  Location: [place];  Service: Orthopedics;  Laterality: Left;  . UPPER GASTROINTESTINAL ENDOSCOPY    . UVULOPALATOPHARYNGOPLASTY (UPPP)/TONSILLECTOMY/SEPTOPLASTY  1990    Social History   Socioeconomic History  . Marital status: Divorced    Spouse name: Not on file  . Number of children: 1  . Years of education: Not on file  . Highest education level: Not on file  Social Needs  . Financial resource strain: Not on file  . Food insecurity - worry:  Not on file  . Food insecurity - inability: Not on file  . Transportation needs - medical: Not on file  . Transportation needs - non-medical: Not on file  Occupational History  . Occupation: retired   Tobacco Use  . Smoking status: Former Smoker    Packs/day: 1.00    Years: 40.00    Pack years: 40.00    Start date: 1972    Last attempt to quit: 06/29/2008    Years since quitting: 8.9  . Smokeless tobacco: Never Used  . Tobacco comment: quit 2010  Substance and Sexual Activity  . Alcohol use: Yes    Alcohol/week: 1.5 oz    Types: 3 Standard drinks or equivalent per week    Comment: 2-3 large drinks, Vodka, 2-3 times a week  . Drug use: No  . Sexual activity: Not on file    Other Topics Concern  . Not on file  Social History Narrative   Lives w/  fiance of > 20 years              Allergies as of 06/24/2017      Reactions   Codeine Itching   Nsaids    Acid Reflux   Simvastatin    Mimics heart attack   Tolmetin    Acid Reflux      Medication List        Accurate as of 06/24/17 11:59 PM. Always use your most recent med list.          albuterol 108 (90 Base) MCG/ACT inhaler Commonly known as:  VENTOLIN HFA Inhale 2 puffs into the lungs every 6 (six) hours as needed for wheezing or shortness of breath.   aspirin 81 MG tablet Take 81 mg by mouth daily.   b complex vitamins capsule Take 1 capsule by mouth daily.   buPROPion 300 MG 24 hr tablet Commonly known as:  WELLBUTRIN XL Take 1 tablet (300 mg total) by mouth daily.   CALCIUM 1200 1200-1000 MG-UNIT Chew Chew 1 tablet by mouth daily.   Coenzyme Q10 200 MG capsule Take 200 mg by mouth daily.   escitalopram 20 MG tablet Commonly known as:  LEXAPRO Take 1 tablet (20 mg total) by mouth daily.   FLEX OMEGA BENEFITS/VIT D-3 PO Take 2 capsules by mouth daily.   gabapentin 800 MG tablet Commonly known as:  NEURONTIN Take 1 tablet (800 mg total) by mouth 4 (four) times daily.   lisinopril 40 MG tablet Commonly known as:  PRINIVIL,ZESTRIL Take 1 tablet (40 mg total) by mouth daily.   magnesium oxide 400 MG tablet Commonly known as:  MAG-OX Take 400 mg by mouth daily.   Melatonin 5 MG Caps Take 10 mg by mouth at bedtime. Patient stated that he is taking 10 mg daily   metoprolol succinate 100 MG 24 hr tablet Commonly known as:  TOPROL-XL Take 1 tablet (100 mg total) by mouth daily. Take with or immediately following a meal.   multivitamin with minerals tablet Take 1 tablet by mouth daily.   NASACORT ALLERGY 24HR 55 MCG/ACT Aero nasal inhaler Generic drug:  triamcinolone Place 2 sprays into the nose at bedtime.   omeprazole 40 MG capsule Commonly known as:  PRILOSEC Take  1 capsule (40 mg total) by mouth daily.   vitamin C 1000 MG tablet Take 1,000 mg by mouth daily.   zolpidem 10 MG tablet Commonly known as:  AMBIEN Take 0.5-1 tablets (5-10 mg total) by mouth at bedtime as needed  for sleep.          Objective:   Physical Exam BP 128/78 (BP Location: Left Arm, Patient Position: Sitting, Cuff Size: Normal)   Pulse (!) 59   Temp 97.9 F (36.6 C) (Oral)   Resp 14   Ht 5\' 10"  (1.778 m)   Wt (!) 325 lb 8 oz (147.6 kg)   SpO2 96%   BMI 46.70 kg/m  General:   Well developed, morbidly obese appearing.  NAD.  Neck: No  thyromegaly  HEENT:  Normocephalic . Face symmetric, atraumatic Lungs:  CTA B Normal respiratory effort, no intercostal retractions, no accessory muscle use. Heart: RRR,  no murmur.  Trace  pretibial edema bilaterally  Abdomen:  Not distended, soft, non-tender. No rebound or rigidity.   Skin: Exposed areas without rash. Not pale. Not jaundice Neurologic:  alert & oriented X3.  Speech normal, gait appropriate for age and unassisted Strength symmetric and appropriate for age.  Psych: Cognition and judgment appear intact.  Cooperative with normal attention span and concentration.  Behavior appropriate. No anxious or depressed appearing.     Assessment & Plan:     Assessment HTN Hyperlipidemia  Chronic lower extremity edema w/ some pretibial redness  (ER 2-17, [place] neg DVT, BNP wnl) anxiety depression, insomnia COPD: Per CT 03-2016 GI: GERD, Barrett's  esophagus OSA, BiPAP, per pulmonary GU: Dr Felipa Eth  --LUTS , failed a # of medications --BPH, elevated PSA:  (-)  biopsy 2009, PSA 05/2014 2.92 --ED --Hematuria, CT, cystoscopy 2009 negative Vitamin D deficiency-  MSK: --Dx w/ Spinal stenosis after he saw neurology (no neuropathy, see OV note 08-07-2012), used to see pain med (Dr Vira Blanco) -High Risk UDS 2016 --CTS Diastasis recti  PLAN HTN: Seems well controlled on lisinopril, metoprolol.  Check CMP,  CBC Hyperlipidemia: Diet controlled, check a FLP Edema: At baseline Mild hyperglycemia noted: Check A1c Anxiety, depression insomnia: On Wellbutrin, Lexapro and occasional Ambien. COPD: minimal sx, occasional cough and wheezing, not using any inhalers. Barrett's esophagus: Recommend to discuss with GI next EGD.  Symptoms controlled on PPIs. Morbid obesity: Risks discussed, information about the bariatric office provided, check a TSH. OSA: Reports good compliance with BiPAP RTC 6 months

## 2017-06-24 NOTE — Progress Notes (Signed)
Pre visit review using our clinic review tool, if applicable. No additional management support is needed unless otherwise documented below in the visit note. 

## 2017-06-25 LAB — LIPID PANEL
Cholesterol: 184 mg/dL (ref 0–200)
HDL: 38 mg/dL — ABNORMAL LOW (ref >39.00)
LDL Cholesterol: 128 mg/dL — ABNORMAL HIGH (ref 0–99)
NonHDL: 146.3
TRIGLYCERIDES: 90 mg/dL (ref 0.0–149.0)
Total CHOL/HDL Ratio: 5
VLDL: 18 mg/dL (ref 0.0–40.0)

## 2017-06-25 LAB — COMPREHENSIVE METABOLIC PANEL
ALBUMIN: 4.5 g/dL (ref 3.5–5.2)
ALK PHOS: 68 U/L (ref 39–117)
ALT: 25 U/L (ref 0–53)
AST: 15 U/L (ref 0–37)
BILIRUBIN TOTAL: 0.5 mg/dL (ref 0.2–1.2)
BUN: 15 mg/dL (ref 6–23)
CALCIUM: 9.4 mg/dL (ref 8.4–10.5)
CHLORIDE: 101 meq/L (ref 96–112)
CO2: 31 mEq/L (ref 19–32)
Creatinine, Ser: 0.89 mg/dL (ref 0.40–1.50)
GFR: 90.71 mL/min (ref >60.00)
Glucose, Bld: 119 mg/dL — ABNORMAL HIGH (ref 70–99)
Potassium: 5.1 mEq/L (ref 3.5–5.1)
SODIUM: 140 meq/L (ref 135–145)
TOTAL PROTEIN: 6.9 g/dL (ref 6.0–8.3)

## 2017-06-25 LAB — CBC WITH DIFFERENTIAL/PLATELET
BASOS PCT: 1.2 % (ref 0.0–3.0)
Basophils Absolute: 0.1 10*3/uL (ref 0.0–0.1)
EOS ABS: 0.2 10*3/uL (ref 0.0–0.7)
EOS PCT: 3.9 % (ref 0.0–5.0)
HEMATOCRIT: 45.9 % (ref 39.0–52.0)
HEMOGLOBIN: 15.1 g/dL (ref 13.0–17.0)
Lymphocytes Relative: 35.8 % (ref 12.0–46.0)
Lymphs Abs: 2.3 10*3/uL (ref 0.7–4.0)
MCHC: 33 g/dL (ref 30.0–36.0)
MCV: 101.7 fl — AB (ref 78.0–100.0)
MONOS PCT: 11.3 % (ref 3.0–12.0)
Monocytes Absolute: 0.7 10*3/uL (ref 0.1–1.0)
NEUTROS ABS: 3 10*3/uL (ref 1.4–7.7)
Neutrophils Relative %: 47.8 % (ref 43.0–77.0)
PLATELETS: 265 10*3/uL (ref 150.0–400.0)
RBC: 4.51 Mil/uL (ref 4.22–5.81)
RDW: 14 % (ref 11.5–15.5)
WBC: 6.4 10*3/uL (ref 4.0–10.5)

## 2017-06-25 LAB — TSH: TSH: 1.49 u[IU]/mL (ref 0.35–4.50)

## 2017-06-25 LAB — HEMOGLOBIN A1C: Hgb A1c MFr Bld: 5.9 % (ref 4.6–6.5)

## 2017-06-25 NOTE — Assessment & Plan Note (Signed)
HTN: Seems well controlled on lisinopril, metoprolol.  Check CMP, CBC Hyperlipidemia: Diet controlled, check a FLP Edema: At baseline Mild hyperglycemia noted: Check A1c Anxiety, depression insomnia: On Wellbutrin, Lexapro and occasional Ambien. COPD: minimal sx, occasional cough and wheezing, not using any inhalers. Barrett's esophagus: Recommend to discuss with GI next EGD.  Symptoms controlled on PPIs. Morbid obesity: Risks discussed, information about the bariatric office provided, check a TSH. OSA: Reports good compliance with BiPAP RTC 6 months

## 2017-06-30 ENCOUNTER — Other Ambulatory Visit: Payer: Self-pay | Admitting: Internal Medicine

## 2017-07-01 ENCOUNTER — Other Ambulatory Visit: Payer: Self-pay | Admitting: Internal Medicine

## 2017-07-01 NOTE — Telephone Encounter (Signed)
Medication Detail    Disp Refills Start End   gabapentin (NEURONTIN) 800 MG tablet 360 tablet 1 06/24/2017    Sig - Route: Take 1 tablet (800 mg total) by mouth 4 (four) times daily. - Oral   Sent to pharmacy as: gabapentin (NEURONTIN) 800 MG tablet   E-Prescribing Status: Receipt confirmed by pharmacy (06/24/2017 3:07 PM EST)     Gabapentin refilled on 06/24/2017 see above.

## 2017-07-01 NOTE — Telephone Encounter (Signed)
Gabapentin refill

## 2017-07-01 NOTE — Telephone Encounter (Signed)
Copied from [place] [phone]. Topic: General - Other >> Jul 01, 2017  3:19 PM Carolyn Stare wrote:  The following rx was sent to pt mail order and is requesting that it be sent to    gabapentin (NEURONTIN) 800 MG tablet  [facility] [address]

## 2017-07-02 ENCOUNTER — Other Ambulatory Visit: Payer: Self-pay | Admitting: Internal Medicine

## 2017-07-02 NOTE — Telephone Encounter (Signed)
Patient is requesting 90 day supply of Gabapentin instead of 30 day supply.

## 2017-07-02 NOTE — Telephone Encounter (Signed)
Copied from [place] [phone]. Topic: Quick Communication - Rx Refill/Question >> Jul 02, 2017  3:06 PM [name], NT wrote: Medication: gabapentin 90 day supply instead of a 30 supplly Has the patient contacted their pharmacy? {yes n (Agent: If no, request that the patient contact the pharmacy for the refill.) Preferred Pharmacy (with phone number or street name): [facility] in [place] [address], [address] [phone] Agent: Please be advised that RX refills may take up to 3 business days. We ask that you follow-up with your pharmacy. Patient would like this and any prescriptions sent to the [facility]  until further notice

## 2017-07-03 MED ORDER — GABAPENTIN 800 MG PO TABS: 800.0000 mg | ORAL_TABLET | Freq: Four times a day (QID) | ORAL | 1 refills | Status: DC

## 2017-07-03 NOTE — Telephone Encounter (Signed)
Rx sent to [facility]

## 2017-07-17 ENCOUNTER — Telehealth: Payer: Self-pay | Admitting: Internal Medicine

## 2017-07-17 ENCOUNTER — Ambulatory Visit (HOSPITAL_BASED_OUTPATIENT_CLINIC_OR_DEPARTMENT_OTHER): Payer: Medicare Other

## 2017-07-17 MED ORDER — CYCLOBENZAPRINE HCL 10 MG PO TABS: 10.0000 mg | ORAL_TABLET | Freq: Two times a day (BID) | ORAL | 0 refills | Status: DC | PRN

## 2017-07-17 NOTE — Telephone Encounter (Signed)
If he have severe symptoms, fever, chills, any unusual lower extremity symptoms or paresthesias: Go to the ER Otherwise recommend Tylenol and will call Flexeril, a muscle relaxant 10 mg twice a day as needed #20 no refills. Advised patient will cause drowsiness Needs to be seen next week either here or at pain management

## 2017-07-17 NOTE — Telephone Encounter (Signed)
Please advise 

## 2017-07-17 NOTE — Telephone Encounter (Signed)
Copied from [place] [phone]. Topic: General - Other >> Jul 17, 2017  3:09 PM Yvette Rack wrote: Reason for CRM: patient calling stating that he need something for his back its been bothering him for the last three days he states that he can hardly get up or walk he would like for a pain medicine or muscle relaxer sent to his pharmacy [facility] - [place], [place] - [address] AT [place] [phone] (Phone) [phone] (Fax)

## 2017-07-17 NOTE — Telephone Encounter (Signed)
Spoke w/ Pt, he is not running fever, chills. Rx sent to [facility]. He will call Monday AM to schedule appt. He understands to go to ED or urgent care if he becomes worse over weekend.

## 2017-07-30 ENCOUNTER — Other Ambulatory Visit: Payer: Self-pay | Admitting: Internal Medicine

## 2017-07-30 ENCOUNTER — Other Ambulatory Visit: Payer: Self-pay | Admitting: *Deleted

## 2017-07-30 MED ORDER — LISINOPRIL 40 MG PO TABS: 40.0000 mg | ORAL_TABLET | Freq: Every day | ORAL | 1 refills | Status: DC

## 2017-07-30 MED ORDER — METOPROLOL SUCCINATE ER 100 MG PO TB24: 100.0000 mg | ORAL_TABLET | Freq: Every day | ORAL | 1 refills | Status: DC

## 2017-07-30 NOTE — Telephone Encounter (Signed)
Metoprolol and Lisinopril sent to pharmacy requested-  Provider will have to resend Zolpidem Rx.  Patient is requesting Rx sent to [facility]

## 2017-07-30 NOTE — Telephone Encounter (Signed)
Copied from [place]. Topic: Quick Communication - Rx Refill/Question >> Jul 30, 2017  3:39 PM Lysle Morales L, NT wrote: Medication: metoprolol 100 mg ,zolpidem 10 mg and also lisinopril 40 Has the patient contacted their pharmacy? {yes  (Agent: If no, request that the patient contact the pharmacy for the refill.) Preferred Pharmacy (with phone number or street name): [facility] ,[place]  [phone] Agent: Please be advised that RX refills may take up to 3 business days. We ask that you follow-up with your pharmacy.  Patient is requesting a 90 day supply for each medication

## 2017-07-31 NOTE — Telephone Encounter (Signed)
Last zolpidem Rx sent 06/24/17, #30 x 1 refill.  Spoke with pharmacist and verified refill still remaining. He will rx for pt. Left message for pt to return my call to notify him to check with the pharmacy later today.

## 2017-09-01 ENCOUNTER — Telehealth: Payer: Self-pay | Admitting: Internal Medicine

## 2017-09-01 NOTE — Telephone Encounter (Signed)
Sent!

## 2017-09-01 NOTE — Telephone Encounter (Signed)
Pt is requesting refill on Ambien.   Last OV: 06/24/2017 Last Fill: 06/24/2017 #30 and 1RF UDS: Not needed for Ambien only per PCP  NCCR printed- no issues noted  Please advise.

## 2017-09-21 ENCOUNTER — Telehealth: Payer: Self-pay | Admitting: *Deleted

## 2017-09-21 DIAGNOSIS — H9190 Unspecified hearing loss, unspecified ear: Secondary | ICD-10-CM

## 2017-09-21 DIAGNOSIS — H9319 Tinnitus, unspecified ear: Secondary | ICD-10-CM

## 2017-09-21 NOTE — Telephone Encounter (Signed)
Please advise if okay to place audiology referral.

## 2017-09-21 NOTE — Telephone Encounter (Signed)
If he has tinnitus and decreased hearing, please arrange ENT referral: Tinnitus Alternatively, if he desires, we could see him here  to be sure it is not something simple like cerumen impaction.

## 2017-09-21 NOTE — Telephone Encounter (Signed)
Copied from [place]. Topic: Referral - Request >> Sep 21, 2017  3:51 PM Vernona Rieger wrote: Reason for CRM: pt wants a referral to get his hearing checked. Please advise. He said he has ringing in his ears and everyone sounds like they are mumbling.  [phone]

## 2017-09-21 NOTE — Telephone Encounter (Signed)
ENT referral placed.

## 2017-10-06 ENCOUNTER — Other Ambulatory Visit: Payer: Self-pay | Admitting: Internal Medicine

## 2017-11-17 ENCOUNTER — Other Ambulatory Visit: Payer: Self-pay

## 2017-11-18 ENCOUNTER — Ambulatory Visit: Payer: Medicare Other | Admitting: Internal Medicine

## 2017-11-19 ENCOUNTER — Encounter: Payer: Self-pay | Admitting: Medical

## 2017-11-19 ENCOUNTER — Ambulatory Visit (INDEPENDENT_AMBULATORY_CARE_PROVIDER_SITE_OTHER): Payer: Medicare Other | Admitting: Medical

## 2017-11-19 VITALS — BP 152/81 | HR 84 | Temp 98.5°F | Resp 16 | Ht 70.0 in | Wt 323.6 lb

## 2017-11-19 DIAGNOSIS — R350 Frequency of micturition: Secondary | ICD-10-CM

## 2017-11-19 DIAGNOSIS — R300 Dysuria: Secondary | ICD-10-CM | POA: Diagnosis not present

## 2017-11-19 LAB — POC URINALSYSI DIPSTICK (AUTOMATED)
BILIRUBIN UA: NEGATIVE
GLUCOSE UA: NEGATIVE
NITRITE UA: NEGATIVE
Protein, UA: POSITIVE — AB
Spec Grav, UA: 1.025 (ref 1.010–1.025)
Urobilinogen, UA: 1 E.U./dL
pH, UA: 6 (ref 5.0–8.0)

## 2017-11-19 MED ORDER — CIPROFLOXACIN HCL 500 MG PO TABS: 500.0000 mg | ORAL_TABLET | Freq: Two times a day (BID) | ORAL | 0 refills | Status: DC

## 2017-11-19 NOTE — Patient Instructions (Addendum)
You do have signs or symptoms of probable UTI/prostatitis as well. Your urine showed infection fighting cells and blood.  Please get PSA and CBC today.  Your urine culture and sensitivity studies are pending.  I am prescribing Cipro antibiotic.   Follow-up in 10 to 14 days or as needed.

## 2017-11-19 NOTE — Progress Notes (Signed)
Subjective:    Patient ID: Robert Gibbs, male    DOB: 12/15/1950, 67 y.o.   MRN: [id number]  HPI  Pt in with some frequent urination, some hesitant flow, some dribbling flow at times and pinkish color to urine. Pt states symptoms have been present over a week.  Known bph as well.  No fever or sweats. States faint chills yesterday. But no chill.  States does feel tired.  Reports no back pain.   No testicle pain, scrotum or perineum pain   Review of Systems  Constitutional: Negative for chills, fatigue and fever.  Respiratory: Negative for cough, chest tightness, shortness of breath and wheezing.   Cardiovascular: Negative for chest pain and palpitations.  Genitourinary: Positive for dysuria, frequency and hematuria. Negative for difficulty urinating.  Neurological: Negative for dizziness and headaches.  Hematological: Negative for adenopathy. Does not bruise/bleed easily.  Psychiatric/Behavioral: Negative for behavioral problems.    Past Medical History:  Diagnosis Date  . Anxiety and depression   . Arthritis   . Barrett esophagus   . BPH (benign prostatic hyperplasia)   . CTS (carpal tunnel syndrome)    h/o  . Edema of both legs    legs , ankles and feet  . Erectile dysfunction   . GERD (gastroesophageal reflux disease) 03/18/2012  . H/O hiatal hernia   . HTN (hypertension)   . Hyperlipidemia   . Microscopic hematuria   . OSA (obstructive sleep apnea) 03/18/2012   on BiPap  . Prostatitis   . PVD (peripheral vascular disease) ([place])   . Spinal stenosis 06-2012   saw neuro w/ "neuropathy", dx w/ spinal stenosis  . Varicose veins   . Vitamin D deficiency   . Wears glasses      Social History   Socioeconomic History  . Marital status: Divorced    Spouse name: Not on file  . Number of children: 1  . Years of education: Not on file  . Highest education level: Not on file  Occupational History  . Occupation: retired   Scientific laboratory technician  . Financial resource  strain: Not on file  . Food insecurity:    Worry: Not on file    Inability: Not on file  . Transportation needs:    Medical: Not on file    Non-medical: Not on file  Tobacco Use  . Smoking status: Former Smoker    Packs/day: 1.00    Years: 40.00    Pack years: 40.00    Start date: 1972    Last attempt to quit: 06/29/2008    Years since quitting: 9.3  . Smokeless tobacco: Never Used  . Tobacco comment: quit 2010  Substance and Sexual Activity  . Alcohol use: Yes    Alcohol/week: 1.5 oz    Types: 3 Standard drinks or equivalent per week    Comment: 2-3 large drinks, Vodka, 2-3 times a week  . Drug use: No  . Sexual activity: Not on file  Lifestyle  . Physical activity:    Days per week: Not on file    Minutes per session: Not on file  . Stress: Not on file  Relationships  . Social connections:    Talks on phone: Not on file    Gets together: Not on file    Attends religious service: Not on file    Active member of club or organization: Not on file    Attends meetings of clubs or organizations: Not on file    Relationship status: Not  on file  . Intimate partner violence:    Fear of current or ex partner: Not on file    Emotionally abused: Not on file    Physically abused: Not on file    Forced sexual activity: Not on file  Other Topics Concern  . Not on file  Social History Narrative   Lives w/  fiance of > 20 years            Past Surgical History:  Procedure Laterality Date  . ARCH BAR REMOVAL    . BACK SURGERY  2002   L4-L5  . [place]   for tennis elbow-rt  . ESOPHAGOGASTRODUODENOSCOPY  04/2015   w/ biopsy, short tongue of Barrett's without obvious nodularity, mass or esophagitis seen  . EYE SURGERY Right    cataract with lens implant  . FOOT SURGERY     R, for plantar fasciitis  . HERNIA REPAIR     x 2 as a child-ing   . KNEE ARTHROSCOPY WITH MENISCAL REPAIR Bilateral 05/04/2013   Procedure: BILATERAL KNEE ARTHROSCOPY WITH POSSIBLE  CHONDROPLASTY AND MENISCECTOMY ;  Surgeon: Kerin Salen, MD;  Location: [facility];  Service: Orthopedics;  Laterality: Bilateral;  Right chrondroplasty, debridement, , partial medial menisectomy, removal of loose bodies.   Marland Kitchen LIGATION / DIVISION SAPHENOUS VEIN     both legs  . PROSTATE BIOPSY     2010, (-)  . TONSILLECTOMY    . TOTAL KNEE ARTHROPLASTY Right 11/07/2013   Procedure: TOTAL KNEE ARTHROPLASTY;  Surgeon: Kerin Salen, MD;  Location: [facility];  Service: Orthopedics;  Laterality: Right;  . TOTAL KNEE ARTHROPLASTY Left 03/01/2014   Procedure: TOTAL KNEE ARTHROPLASTY;  Surgeon: Kerin Salen, MD;  Location: [facility];  Service: Orthopedics;  Laterality: Left;  . UPPER GASTROINTESTINAL ENDOSCOPY    . UVULOPALATOPHARYNGOPLASTY (UPPP)/TONSILLECTOMY/SEPTOPLASTY  1990    Family History  Problem Relation Age of Onset  . CAD Father        ?  Marland Kitchen Alzheimer's disease Mother   . Colon cancer Neg Hx   . Prostate cancer Neg Hx   . Diabetes Neg Hx   . Cancer Neg Hx     Allergies  Allergen Reactions  . Codeine Itching  . Nsaids     Acid Reflux  . Simvastatin     Mimics heart attack  . Tolmetin     Acid Reflux    Current Outpatient Medications on File Prior to Visit  Medication Sig Dispense Refill  . albuterol (VENTOLIN HFA) 108 (90 Base) MCG/ACT inhaler Inhale 2 puffs into the lungs every 6 (six) hours as needed for wheezing or shortness of breath. (Patient not taking: Reported on 06/24/2017) 1 Inhaler 1  . Ascorbic Acid (VITAMIN C) 1000 MG tablet Take 1,000 mg by mouth daily.    Marland Kitchen aspirin 81 MG tablet Take 81 mg by mouth daily.    Marland Kitchen b complex vitamins capsule Take 1 capsule by mouth daily.    Marland Kitchen buPROPion (WELLBUTRIN XL) 300 MG 24 hr tablet Take 1 tablet (300 mg total) by mouth daily. 90 tablet 1  . Calcium Carbonate-Vit D-Min (CALCIUM 1200) 1200-1000 MG-UNIT CHEW Chew 1 tablet by mouth daily.     . Coenzyme Q10 200 MG capsule Take 200 mg by mouth daily.    . cyclobenzaprine  (FLEXERIL) 10 MG tablet Take 1 tablet (10 mg total) by mouth 2 (two) times daily as needed for muscle spasms. 20 tablet 0  .  escitalopram (LEXAPRO) 20 MG tablet Take 1 tablet (20 mg total) by mouth daily. 90 tablet 1  . gabapentin (NEURONTIN) 800 MG tablet Take 1 tablet (800 mg total) by mouth 4 (four) times daily. 360 tablet 1  . lisinopril (PRINIVIL,ZESTRIL) 40 MG tablet Take 1 tablet (40 mg total) by mouth daily. 90 tablet 1  . magnesium oxide (MAG-OX) 400 MG tablet Take 400 mg by mouth daily.    . Melatonin 5 MG CAPS Take 10 mg by mouth at bedtime. Patient stated that he is taking 10 mg daily    . metoprolol succinate (TOPROL-XL) 100 MG 24 hr tablet Take 1 tablet (100 mg total) by mouth daily. Take with or immediately following a meal. 90 tablet 1  . Multiple Vitamins-Minerals (MULTIVITAMIN WITH MINERALS) tablet Take 1 tablet by mouth daily.    . Omega-3 Fat Ac-Cholecalciferol (FLEX OMEGA BENEFITS/VIT D-3 PO) Take 2 capsules by mouth daily.    Marland Kitchen omeprazole (PRILOSEC) 40 MG capsule Take 1 capsule (40 mg total) by mouth daily. 90 capsule 1  . triamcinolone (NASACORT ALLERGY 24HR) 55 MCG/ACT AERO nasal inhaler Place 2 sprays into the nose at bedtime.    Marland Kitchen zolpidem (AMBIEN) 10 MG tablet TAKE 1/2 TO 1 TABLET(5 TO 10 MG) BY MOUTH AT BEDTIME AS NEEDED FOR SLEEP 30 tablet 5   No current facility-administered medications on file prior to visit.     BP (!) 152/81   Pulse 84   Temp 98.5 F (36.9 C) (Oral)   Resp 16   Ht 5\' 10"  (1.778 m)   Wt (!) 323 lb 9.6 oz (146.8 kg)   SpO2 98%   BMI 46.43 kg/m       Objective:   Physical Exam  General Appearance- Not in acute distress.  HEENT Eyes- Scleraeral/Conjuntiva-bilat- Not Yellow. Mouth & Throat- Normal.  Chest and Lung Exam Auscultation: Breath sounds:-Normal. Adventitious sounds:- No Adventitious sounds.  Cardiovascular Auscultation:Rythm - Regular. Heart Sounds -Normal heart sounds.  Abdomen Inspection:-Inspection Normal.    Palpation/Perucssion: Palpation and Percussion of the abdomen reveal- Non-Tender, No Rebound tenderness, No rigidity(Guarding) and No Palpable abdominal masses.  Liver:-Normal.  Spleen:- Normal.    Back- no cva tenderness      Assessment & Plan:  You do have signs or symptoms of probable UTI/ prostatitis as well. Your urine showed infection fighting cells and blood.  Please get PSA and CBC today.  Your urine culture and sensitivity studies are pending.  I am prescribing Cipro antibiotic.   Follow-up in 10 to 14 days or as needed.

## 2017-11-20 LAB — CBC WITH DIFFERENTIAL/PLATELET
Basophils Absolute: 0.1 10*3/uL (ref 0.0–0.1)
Basophils Relative: 0.8 % (ref 0.0–3.0)
EOS PCT: 1.5 % (ref 0.0–5.0)
Eosinophils Absolute: 0.1 10*3/uL (ref 0.0–0.7)
HEMATOCRIT: 41.8 % (ref 39.0–52.0)
Hemoglobin: 14.4 g/dL (ref 13.0–17.0)
LYMPHS ABS: 1.4 10*3/uL (ref 0.7–4.0)
Lymphocytes Relative: 13.7 % (ref 12.0–46.0)
MCHC: 34.4 g/dL (ref 30.0–36.0)
MCV: 97.8 fl (ref 78.0–100.0)
MONOS PCT: 9.4 % (ref 3.0–12.0)
Monocytes Absolute: 0.9 10*3/uL (ref 0.1–1.0)
NEUTROS ABS: 7.4 10*3/uL (ref 1.4–7.7)
NEUTROS PCT: 74.6 % (ref 43.0–77.0)
PLATELETS: 217 10*3/uL (ref 150.0–400.0)
RBC: 4.27 Mil/uL (ref 4.22–5.81)
RDW: 13.5 % (ref 11.5–15.5)
WBC: 10 10*3/uL (ref 4.0–10.5)

## 2017-11-20 LAB — PSA: PSA: 25.47 ng/mL — ABNORMAL HIGH (ref 0.10–4.00)

## 2017-11-21 LAB — URINE CULTURE
MICRO NUMBER: 90652520
SPECIMEN QUALITY: ADEQUATE

## 2017-12-01 ENCOUNTER — Ambulatory Visit (INDEPENDENT_AMBULATORY_CARE_PROVIDER_SITE_OTHER): Payer: Medicare Other | Admitting: Internal Medicine

## 2017-12-01 ENCOUNTER — Encounter: Payer: Self-pay | Admitting: Internal Medicine

## 2017-12-01 VITALS — BP 128/68 | HR 58 | Temp 97.8°F | Resp 16 | Ht 70.0 in | Wt 325.5 lb

## 2017-12-01 DIAGNOSIS — M4800 Spinal stenosis, site unspecified: Secondary | ICD-10-CM

## 2017-12-01 DIAGNOSIS — I10 Essential (primary) hypertension: Secondary | ICD-10-CM | POA: Diagnosis not present

## 2017-12-01 DIAGNOSIS — F32A Depression, unspecified: Secondary | ICD-10-CM

## 2017-12-01 DIAGNOSIS — F329 Major depressive disorder, single episode, unspecified: Secondary | ICD-10-CM

## 2017-12-01 DIAGNOSIS — F419 Anxiety disorder, unspecified: Secondary | ICD-10-CM

## 2017-12-01 DIAGNOSIS — N410 Acute prostatitis: Secondary | ICD-10-CM

## 2017-12-01 DIAGNOSIS — R739 Hyperglycemia, unspecified: Secondary | ICD-10-CM | POA: Diagnosis not present

## 2017-12-01 MED ORDER — CIPROFLOXACIN HCL 500 MG PO TABS: 500.0000 mg | ORAL_TABLET | Freq: Two times a day (BID) | ORAL | 0 refills | Status: DC

## 2017-12-01 MED ORDER — BUPROPION HCL ER (XL) 300 MG PO TB24: 300.0000 mg | ORAL_TABLET | Freq: Every day | ORAL | 1 refills | Status: DC

## 2017-12-01 MED ORDER — ZOLPIDEM TARTRATE 10 MG PO TABS: ORAL_TABLET | ORAL | 3 refills | Status: DC

## 2017-12-01 MED ORDER — GABAPENTIN 800 MG PO TABS: 800.0000 mg | ORAL_TABLET | Freq: Four times a day (QID) | ORAL | 1 refills | Status: DC

## 2017-12-01 NOTE — Progress Notes (Signed)
Pre visit review using our clinic review tool, if applicable. No additional management support is needed unless otherwise documented below in the visit note. 

## 2017-12-01 NOTE — Patient Instructions (Signed)
GO TO THE LAB : Get the blood work     GO TO THE FRONT DESK Schedule your next appointment for a physical exam in 6 months  Continue ciprofloxacin for 11 additional days  See your urologist, please call if you need referral  Call if you are not back to normal in the next few days

## 2017-12-01 NOTE — Progress Notes (Signed)
Subjective:    Patient ID: Robert Gibbs, male    DOB: [birth year], 67 y.o.   MRN: [id number]  DOS:  12/01/2017 Type of visit - description : f/u Interval history:  Was seen 11/19/2017, frequent urination, hesitant flow, some dribbling. Culture show with E. coli PSA was 25   CBC wnl Treated with antibiotics, feeling better.  Today we are also addressing his routine problems : HTN: Good compliance of medication, no apparent side effects.  spinal stenosis: Symptoms controlled, needs a refill on gabapentin Diabetes: Due for A1c Anxiety depression insomnia: Well-controlled, due for refills.  Review of Systems At this point he is doing well. No fever chills No nausea, vomiting, diarrhea Still has some residual dysuria but no gross hematuria.   Past Medical History:  Diagnosis Date  . Anxiety and depression   . Arthritis   . Barrett esophagus   . BPH (benign prostatic hyperplasia)   . CTS (carpal tunnel syndrome)    h/o  . Edema of both legs    legs , ankles and feet  . Erectile dysfunction   . GERD (gastroesophageal reflux disease) 03/18/2012  . H/O hiatal hernia   . HTN (hypertension)   . Hyperlipidemia   . Microscopic hematuria   . OSA (obstructive sleep apnea) 03/18/2012   on BiPap  . Prostatitis   . PVD (peripheral vascular disease) ([place])   . Spinal stenosis 06-2012   saw neuro w/ "neuropathy", dx w/ spinal stenosis  . Varicose veins   . Vitamin D deficiency   . Wears glasses     Past Surgical History:  Procedure Laterality Date  . ARCH BAR REMOVAL    . BACK SURGERY  2002   L4-L5  . [place]   for tennis elbow-rt  . ESOPHAGOGASTRODUODENOSCOPY  04/2015   w/ biopsy, short tongue of Barrett's without obvious nodularity, mass or esophagitis seen  . EYE SURGERY Right    cataract with lens implant  . FOOT SURGERY     R, for plantar fasciitis  . HERNIA REPAIR     x 2 as a child-ing   . KNEE ARTHROSCOPY WITH MENISCAL REPAIR Bilateral 05/04/2013   Procedure: BILATERAL KNEE ARTHROSCOPY WITH POSSIBLE CHONDROPLASTY AND MENISCECTOMY ;  Surgeon: Kerin Salen, MD;  Location: [facility];  Service: Orthopedics;  Laterality: Bilateral;  Right chrondroplasty, debridement, , partial medial menisectomy, removal of loose bodies.   Marland Kitchen LIGATION / DIVISION SAPHENOUS VEIN     both legs  . PROSTATE BIOPSY     2010, (-)  . TONSILLECTOMY    . TOTAL KNEE ARTHROPLASTY Right 11/07/2013   Procedure: TOTAL KNEE ARTHROPLASTY;  Surgeon: Kerin Salen, MD;  Location: [place] [facility];  Service: Orthopedics;  Laterality: Right;  . TOTAL KNEE ARTHROPLASTY Left 03/01/2014   Procedure: TOTAL KNEE ARTHROPLASTY;  Surgeon: Kerin Salen, MD;  Location: [facility];  Service: Orthopedics;  Laterality: Left;  . UPPER GASTROINTESTINAL ENDOSCOPY    . UVULOPALATOPHARYNGOPLASTY (UPPP)/TONSILLECTOMY/SEPTOPLASTY  1990    Social History   Socioeconomic History  . Marital status: Divorced    Spouse name: Not on file  . Number of children: 1  . Years of education: Not on file  . Highest education level: Not on file  Occupational History  . Occupation: retired   Scientific laboratory technician  . Financial resource strain: Not on file  . Food insecurity:    Worry: Not on file    Inability: Not on file  . Transportation needs:  Medical: Not on file    Non-medical: Not on file  Tobacco Use  . Smoking status: Former Smoker    Packs/day: 1.00    Years: 40.00    Pack years: 40.00    Start date: 1972    Last attempt to quit: 06/29/2008    Years since quitting: 9.4  . Smokeless tobacco: Never Used  . Tobacco comment: quit 2010  Substance and Sexual Activity  . Alcohol use: Yes    Alcohol/week: 1.5 oz    Types: 3 Standard drinks or equivalent per week    Comment: 2-3 large drinks, Vodka, 2-3 times a week  . Drug use: No  . Sexual activity: Not on file  Lifestyle  . Physical activity:    Days per week: Not on file    Minutes per session: Not on file  . Stress: Not on file    Relationships  . Social connections:    Talks on phone: Not on file    Gets together: Not on file    Attends religious service: Not on file    Active member of club or organization: Not on file    Attends meetings of clubs or organizations: Not on file    Relationship status: Not on file  . Intimate partner violence:    Fear of current or ex partner: Not on file    Emotionally abused: Not on file    Physically abused: Not on file    Forced sexual activity: Not on file  Other Topics Concern  . Not on file  Social History Narrative   Lives w/  fiance of > 20 years              Allergies as of 12/01/2017      Reactions   Codeine Itching   Nsaids    Acid Reflux   Simvastatin    Mimics heart attack   Tolmetin    Acid Reflux      Medication List        Accurate as of 12/01/17 11:59 PM. Always use your most recent med list.          albuterol 108 (90 Base) MCG/ACT inhaler Commonly known as:  VENTOLIN HFA Inhale 2 puffs into the lungs every 6 (six) hours as needed for wheezing or shortness of breath.   aspirin 81 MG tablet Take 81 mg by mouth daily.   b complex vitamins capsule Take 1 capsule by mouth daily.   buPROPion 300 MG 24 hr tablet Commonly known as:  WELLBUTRIN XL Take 1 tablet (300 mg total) by mouth daily.   CALCIUM 1200 1200-1000 MG-UNIT Chew Chew 1 tablet by mouth daily.   ciprofloxacin 500 MG tablet Commonly known as:  CIPRO Take 1 tablet (500 mg total) by mouth 2 (two) times daily.   Coenzyme Q10 200 MG capsule Take 200 mg by mouth daily.   cyclobenzaprine 10 MG tablet Commonly known as:  FLEXERIL Take 1 tablet (10 mg total) by mouth 2 (two) times daily as needed for muscle spasms.   escitalopram 20 MG tablet Commonly known as:  LEXAPRO Take 1 tablet (20 mg total) by mouth daily.   FLEX OMEGA BENEFITS/VIT D-3 PO Take 2 capsules by mouth daily.   gabapentin 800 MG tablet Commonly known as:  NEURONTIN Take 1 tablet (800 mg total) by  mouth 4 (four) times daily.   lisinopril 40 MG tablet Commonly known as:  PRINIVIL,ZESTRIL Take 1 tablet (40 mg total) by mouth daily.  magnesium oxide 400 MG tablet Commonly known as:  MAG-OX Take 400 mg by mouth daily.   Melatonin 5 MG Caps Take 10 mg by mouth at bedtime. Patient stated that he is taking 10 mg daily   metoprolol succinate 100 MG 24 hr tablet Commonly known as:  TOPROL-XL Take 1 tablet (100 mg total) by mouth daily. Take with or immediately following a meal.   multivitamin with minerals tablet Take 1 tablet by mouth daily.   NASACORT ALLERGY 24HR 55 MCG/ACT Aero nasal inhaler Generic drug:  triamcinolone Place 2 sprays into the nose at bedtime.   omeprazole 40 MG capsule Commonly known as:  PRILOSEC Take 1 capsule (40 mg total) by mouth daily.   vitamin C 1000 MG tablet Take 1,000 mg by mouth daily.   zolpidem 10 MG tablet Commonly known as:  AMBIEN TAKE 1/2 TO 1 TABLET(5 TO 10 MG) BY MOUTH AT BEDTIME AS NEEDED FOR SLEEP          Objective:   Physical Exam BP 128/68 (BP Location: Left Arm, Patient Position: Sitting, Cuff Size: Normal)   Pulse (!) 58   Temp 97.8 F (36.6 C) (Oral)   Resp 16   Ht 5\' 10"  (1.778 m)   Wt (!) 325 lb 8 oz (147.6 kg)   SpO2 97%   BMI 46.70 kg/m  General:   Well developed, well nourished . NAD.  HEENT:  Normocephalic . Face symmetric, atraumatic Lungs:  CTA B Normal respiratory effort, no intercostal retractions, no accessory muscle use. Heart: RRR,  no murmur.  Trace pretibial edema bilaterally  Abdomen:  Not distended, soft, non-tender. No rebound or rigidity.  No CVA tenderness Skin: Not pale. Not jaundice Neurologic:  alert & oriented X3.  Speech normal, gait appropriate for age and unassisted Psych--  Cognition and judgment appear intact.  Cooperative with normal attention span and concentration.  Behavior appropriate. No anxious or depressed appearing.     Assessment & Plan:    Assessment Hyperglycemia HTN Hyperlipidemia  Chronic lower extremity edema w/ some pretibial redness  (ER 2-17, [place] neg DVT, BNP wnl) anxiety depression, insomnia COPD: Per CT 03-2016 GI: GERD, Barrett's  esophagus OSA, BiPAP, per pulmonary GU: Dr Felipa Eth  --LUTS , failed a # of medications --BPH, elevated PSA:  (-)  biopsy 2009, PSA 05/2014 2.92 --ED --Hematuria, CT, cystoscopy 2009 negative Vitamin D deficiency-  MSK: -- Spinal stenosis : saw neurology w/ sx of paresthesias feet-ankle, sx felt to be d/t spinal stenosis not neuropathy, see OV note 08-07-2012 , used to see pain med (Dr Vira Blanco) -High Risk UDS 2016 --CTS Diastasis recti  PLAN Hyperglycemia: Check a A1c HTN: Seems controlled on lisinopril, metoprolol.  Check a CMP Hyperlipidemia: Relatively well controlled on diet.  See last FLP Anxiety depression insomnia: Controlled and on Wellbutrin, Lexapro, Ambien. UTI, prostatitis: Recently seen with urinary symptoms, no DRE was stone, PSA was 25, urine culture E. coli.  Status post 10 days of Cipro, will send 11 additional days of antibiotics to complete 3 weeks.  He is getting better.  Needs to see urology, states he will.   Spinal  stenosis: With feet and ankle paresthesia, at baseline, on gabapentin. RF sent  RTC 6 months, CPX

## 2017-12-02 LAB — COMPREHENSIVE METABOLIC PANEL
ALT: 24 U/L (ref 0–53)
AST: 14 U/L (ref 0–37)
Albumin: 4.1 g/dL (ref 3.5–5.2)
Alkaline Phosphatase: 69 U/L (ref 39–117)
BILIRUBIN TOTAL: 0.4 mg/dL (ref 0.2–1.2)
BUN: 11 mg/dL (ref 6–23)
CALCIUM: 9 mg/dL (ref 8.4–10.5)
CHLORIDE: 101 meq/L (ref 96–112)
CO2: 29 mEq/L (ref 19–32)
Creatinine, Ser: 0.9 mg/dL (ref 0.40–1.50)
GFR: 89.43 mL/min (ref >60.00)
GLUCOSE: 115 mg/dL — AB (ref 70–99)
POTASSIUM: 4.7 meq/L (ref 3.5–5.1)
Sodium: 138 mEq/L (ref 135–145)
Total Protein: 6.2 g/dL (ref 6.0–8.3)

## 2017-12-02 LAB — HEMOGLOBIN A1C: Hgb A1c MFr Bld: 6.2 % (ref 4.6–6.5)

## 2017-12-02 NOTE — Assessment & Plan Note (Signed)
Hyperglycemia: Check a A1c HTN: Seems controlled on lisinopril, metoprolol.  Check a CMP Hyperlipidemia: Relatively well controlled on diet.  See last FLP Anxiety depression insomnia: Controlled and on Wellbutrin, Lexapro, Ambien. UTI, prostatitis: Recently seen with urinary symptoms, no DRE was stone, PSA was 25, urine culture E. coli.  Status post 10 days of Cipro, will send 11 additional days of antibiotics to complete 3 weeks.  He is getting better.  Needs to see urology, states he will.   Spinal  stenosis: With feet and ankle paresthesia, at baseline, on gabapentin. RF sent  RTC 6 months, CPX

## 2017-12-22 ENCOUNTER — Ambulatory Visit: Payer: Medicare Other | Admitting: Internal Medicine

## 2017-12-29 ENCOUNTER — Other Ambulatory Visit: Payer: Self-pay | Admitting: Internal Medicine

## 2018-02-03 ENCOUNTER — Other Ambulatory Visit: Payer: Self-pay | Admitting: Internal Medicine

## 2018-04-20 ENCOUNTER — Encounter: Payer: Self-pay | Admitting: Internal Medicine

## 2018-04-20 ENCOUNTER — Ambulatory Visit (INDEPENDENT_AMBULATORY_CARE_PROVIDER_SITE_OTHER): Payer: Medicare Other | Admitting: Internal Medicine

## 2018-04-20 VITALS — BP 122/70 | HR 47 | Temp 98.8°F | Resp 16 | Ht 70.0 in | Wt 315.0 lb

## 2018-04-20 DIAGNOSIS — R519 Headache, unspecified: Secondary | ICD-10-CM

## 2018-04-20 DIAGNOSIS — R51 Headache: Secondary | ICD-10-CM | POA: Diagnosis not present

## 2018-04-20 DIAGNOSIS — R399 Unspecified symptoms and signs involving the genitourinary system: Secondary | ICD-10-CM

## 2018-04-20 DIAGNOSIS — R319 Hematuria, unspecified: Secondary | ICD-10-CM

## 2018-04-20 DIAGNOSIS — N390 Urinary tract infection, site not specified: Secondary | ICD-10-CM | POA: Diagnosis not present

## 2018-04-20 DIAGNOSIS — N410 Acute prostatitis: Secondary | ICD-10-CM | POA: Diagnosis not present

## 2018-04-20 DIAGNOSIS — N418 Other inflammatory diseases of prostate: Secondary | ICD-10-CM

## 2018-04-20 LAB — POC URINALSYSI DIPSTICK (AUTOMATED)
Bilirubin, UA: NEGATIVE
Glucose, UA: NEGATIVE
KETONES UA: NEGATIVE
LEUKOCYTES UA: NEGATIVE
Nitrite, UA: NEGATIVE
PROTEIN UA: NEGATIVE
UROBILINOGEN UA: 0.2 U/dL
pH, UA: 6 (ref 5.0–8.0)

## 2018-04-20 MED ORDER — SULFAMETHOXAZOLE-TRIMETHOPRIM 800-160 MG PO TABS: 1.0000 | ORAL_TABLET | Freq: Two times a day (BID) | ORAL | 0 refills | Status: DC

## 2018-04-20 NOTE — Progress Notes (Signed)
Subjective:    Patient ID: Robert Gibbs, male    DOB: [birth year], 67 y.o.   MRN: [id number]  DOS:  04/20/2018 Type of visit - description : acute Interval history: Symptoms of started 5 days ago, noted that his urine "smell funny".  He also developed urinary frequency but n dysuria. Also, having headache for the last 5 days, it is mild but persisting, not the worst of his life.  Sometimes associated with nausea.  No history of migraines.  No light or noise intolerance.  Review of Systems Denies fever chills No gross hematuria. No flank or abdominal pain Admits to mild difficulty urinating which is at baseline.   Past Medical History:  Diagnosis Date  . Anxiety and depression   . Arthritis   . Barrett esophagus   . BPH (benign prostatic hyperplasia)   . CTS (carpal tunnel syndrome)    h/o  . Edema of both legs    legs , ankles and feet  . Erectile dysfunction   . GERD (gastroesophageal reflux disease) 03/18/2012  . H/O hiatal hernia   . HTN (hypertension)   . Hyperlipidemia   . Microscopic hematuria   . OSA (obstructive sleep apnea) 03/18/2012   on BiPap  . Prostatitis   . PVD (peripheral vascular disease) ([place])   . Spinal stenosis 06-2012   saw neuro w/ "neuropathy", dx w/ spinal stenosis  . Varicose veins   . Vitamin D deficiency   . Wears glasses     Past Surgical History:  Procedure Laterality Date  . ARCH BAR REMOVAL    . BACK SURGERY  2002   L4-L5  . [place]   for tennis elbow-rt  . ESOPHAGOGASTRODUODENOSCOPY  04/2015   w/ biopsy, short tongue of Barrett's without obvious nodularity, mass or esophagitis seen  . EYE SURGERY Right    cataract with lens implant  . FOOT SURGERY     R, for plantar fasciitis  . HERNIA REPAIR     x 2 as a child-ing   . KNEE ARTHROSCOPY WITH MENISCAL REPAIR Bilateral 05/04/2013   Procedure: BILATERAL KNEE ARTHROSCOPY WITH POSSIBLE CHONDROPLASTY AND MENISCECTOMY ;  Surgeon: Kerin Salen, MD;  Location: [facility];  Service: Orthopedics;  Laterality: Bilateral;  Right chrondroplasty, debridement, , partial medial menisectomy, removal of loose bodies.   Marland Kitchen LIGATION / DIVISION SAPHENOUS VEIN     both legs  . PROSTATE BIOPSY     2010, (-)  . TONSILLECTOMY    . TOTAL KNEE ARTHROPLASTY Right 11/07/2013   Procedure: TOTAL KNEE ARTHROPLASTY;  Surgeon: Kerin Salen, MD;  Location: [facility];  Service: Orthopedics;  Laterality: Right;  . TOTAL KNEE ARTHROPLASTY Left 03/01/2014   Procedure: TOTAL KNEE ARTHROPLASTY;  Surgeon: Kerin Salen, MD;  Location: [facility];  Service: Orthopedics;  Laterality: Left;  . UPPER GASTROINTESTINAL ENDOSCOPY    . UVULOPALATOPHARYNGOPLASTY (UPPP)/TONSILLECTOMY/SEPTOPLASTY  1990    Social History   Socioeconomic History  . Marital status: Divorced    Spouse name: Not on file  . Number of children: 1  . Years of education: Not on file  . Highest education level: Not on file  Occupational History  . Occupation: retired   Scientific laboratory technician  . Financial resource strain: Not on file  . Food insecurity:    Worry: Not on file    Inability: Not on file  . Transportation needs:    Medical: Not on file    Non-medical: Not on  file  Tobacco Use  . Smoking status: Former Smoker    Packs/day: 1.00    Years: 40.00    Pack years: 40.00    Start date: 1972    Last attempt to quit: 06/29/2008    Years since quitting: 9.8  . Smokeless tobacco: Never Used  . Tobacco comment: quit 2010  Substance and Sexual Activity  . Alcohol use: Yes    Alcohol/week: 3.0 standard drinks    Types: 3 Standard drinks or equivalent per week    Comment: 2-3 large drinks, Vodka, 2-3 times a week  . Drug use: No  . Sexual activity: Yes  Lifestyle  . Physical activity:    Days per week: Not on file    Minutes per session: Not on file  . Stress: Not on file  Relationships  . Social connections:    Talks on phone: Not on file    Gets together: Not on file    Attends religious service: Not  on file    Active member of club or organization: Not on file    Attends meetings of clubs or organizations: Not on file    Relationship status: Not on file  . Intimate partner violence:    Fear of current or ex partner: Not on file    Emotionally abused: Not on file    Physically abused: Not on file    Forced sexual activity: Not on file  Other Topics Concern  . Not on file  Social History Narrative   Lives w/  fiance of > 20 years              Allergies as of 04/20/2018      Reactions   Codeine Itching   Nsaids    Acid Reflux   Simvastatin    Mimics heart attack   Tolmetin    Acid Reflux      Medication List        Accurate as of 04/20/18 11:59 PM. Always use your most recent med list.          albuterol 108 (90 Base) MCG/ACT inhaler Commonly known as:  PROVENTIL HFA;VENTOLIN HFA Inhale 2 puffs into the lungs every 6 (six) hours as needed for wheezing or shortness of breath.   aspirin 81 MG tablet Take 81 mg by mouth daily.   b complex vitamins capsule Take 1 capsule by mouth daily.   buPROPion 300 MG 24 hr tablet Commonly known as:  WELLBUTRIN XL Take 1 tablet (300 mg total) by mouth daily.   CALCIUM 1200 1200-1000 MG-UNIT Chew Chew 1 tablet by mouth daily.   Coenzyme Q10 200 MG capsule Take 200 mg by mouth daily.   cyclobenzaprine 10 MG tablet Commonly known as:  FLEXERIL Take 1 tablet (10 mg total) by mouth 2 (two) times daily as needed for muscle spasms.   escitalopram 20 MG tablet Commonly known as:  LEXAPRO Take 1 tablet (20 mg total) by mouth daily.   FLEX OMEGA BENEFITS/VIT D-3 PO Take 2 capsules by mouth daily.   gabapentin 800 MG tablet Commonly known as:  NEURONTIN Take 1 tablet (800 mg total) by mouth 4 (four) times daily.   lisinopril 40 MG tablet Commonly known as:  PRINIVIL,ZESTRIL Take 1 tablet (40 mg total) by mouth daily.   magnesium oxide 400 MG tablet Commonly known as:  MAG-OX Take 400 mg by mouth daily.     Melatonin 5 MG Caps Take 10 mg by mouth at bedtime. Patient stated  that he is taking 10 mg daily   metoprolol succinate 100 MG 24 hr tablet Commonly known as:  TOPROL-XL TAKE 1 TABLET BY MOUTH DAILY WITH OR IMMEDIATELY FOLLOWING A MEAL   multivitamin with minerals tablet Take 1 tablet by mouth daily.   NASACORT ALLERGY 24HR 55 MCG/ACT Aero nasal inhaler Generic drug:  triamcinolone Place 2 sprays into the nose at bedtime.   omeprazole 40 MG capsule Commonly known as:  PRILOSEC Take 1 capsule (40 mg total) by mouth daily.   sulfamethoxazole-trimethoprim 800-160 MG tablet Commonly known as:  BACTRIM DS,SEPTRA DS Take 1 tablet by mouth 2 (two) times daily.   vitamin C 1000 MG tablet Take 1,000 mg by mouth daily.   zolpidem 10 MG tablet Commonly known as:  AMBIEN TAKE 1/2 TO 1 TABLET(5 TO 10 MG) BY MOUTH AT BEDTIME AS NEEDED FOR SLEEP          Objective:   Physical Exam BP 122/70 (BP Location: Left Arm, Patient Position: Sitting, Cuff Size: Normal)   Pulse (!) 47   Temp 98.8 F (37.1 C) (Oral)   Resp 16   Ht 5\' 10"  (1.778 m)   Wt (!) 315 lb (142.9 kg)   SpO2 93%   BMI 45.20 kg/m  General:   Well developed, NAD, see BMI.  HEENT:  Normocephalic . Face symmetric, atraumatic Neck: No TTP, range of motion normal Lungs:  CTA B Normal respiratory effort, no intercostal retractions, no accessory muscle use. Heart: RRR,  no murmur.  no pretibial edema bilaterally  Abdomen:  Not distended, soft, non-tender. No rebound or rigidity.  No CVA tenderness Skin: Not pale. Not jaundice DRE: Declined Neurologic:  alert & oriented X3.  Speech normal, gait appropriate for age and unassisted. EOMI, motor symmetric.   Psych--  Cognition and judgment appear intact.  Cooperative with normal attention span and concentration.  Behavior appropriate. No anxious or depressed appearing.     Assessment & Plan:    Assessment Hyperglycemia HTN Hyperlipidemia  Chronic lower  extremity edema w/ some pretibial redness  (ER 2-17, [place] neg DVT, BNP wnl) anxiety depression, insomnia COPD: Per CT 03-2016 GI: GERD, Barrett's  esophagus OSA, BiPAP, per pulmonary GU: Dr Felipa Eth  --LUTS , failed a # of medications --BPH, elevated PSA:  (-)  biopsy 2009, PSA 05/2014 2.92 --ED --Hematuria, CT, cystoscopy 2009 negative Vitamin D deficiency-  MSK: -- Spinal stenosis : saw neurology w/ sx of paresthesias feet-ankle, sx felt to be d/t spinal stenosis not neuropathy, see OV note 08-07-2012 , used to see pain med (Dr Vira Blanco) -High Risk UDS 2016 --CTS Diastasis recti  PLAN UTI versus prostatitis:   On May 2019 he had E. coli UTI with a PSA of 25 felt to be d/t prostatitis, treated with ciprofloxacin, he improved significantly and become asymptomatic until 5 days ago when LUTS resurface.  Did not follow-up with urology after last prostatitis. Patient declined a DRE today. Urinalysis today shows some blood.  We will send a UA, urine culture.  Start empiric Bactrim DS.  See AVS. Referral to enter for urology, strongly encouraged to keep the appointment and call if he doesn't hear from urology. Headache: Neurological exam is nonfocal, not the worst headache of his life, unclear etiology, recommend supportive treatment with Tylenol and Excedrin and call if something is different or if he has severe headache.

## 2018-04-20 NOTE — Progress Notes (Signed)
Pre visit review using our clinic review tool, if applicable. No additional management support is needed unless otherwise documented below in the visit note. 

## 2018-04-20 NOTE — Patient Instructions (Addendum)
Start taking Bactrim twice a day for 2 weeks  Avoid excessive sun exposure while on antibiotics  Drink plenty fluids  We are referring you to the urologist, if you do not hear from them in 3 days please call his office  Call if not gradually better  Call if fever, chills, increased urinary symptoms  For the headache: Take Tylenol or Excedrin OTC.  If you are not gradually better let us know.

## 2018-04-21 LAB — URINALYSIS, ROUTINE W REFLEX MICROSCOPIC
BILIRUBIN URINE: NEGATIVE
Ketones, ur: NEGATIVE
LEUKOCYTES UA: NEGATIVE
NITRITE: NEGATIVE
Specific Gravity, Urine: 1.025 (ref 1.000–1.030)
Total Protein, Urine: NEGATIVE
Urine Glucose: NEGATIVE
Urobilinogen, UA: 0.2 (ref 0.0–1.0)
pH: 6 (ref 5.0–8.0)

## 2018-04-21 LAB — URINE CULTURE
MICRO NUMBER: 91300193
Result:: NO GROWTH
SPECIMEN QUALITY: ADEQUATE

## 2018-04-21 NOTE — Assessment & Plan Note (Signed)
UTI versus prostatitis:   On May 2019 he had E. coli UTI with a PSA of 25 felt to be d/t prostatitis, treated with ciprofloxacin, he improved significantly and become asymptomatic until 5 days ago when LUTS resurface.  Did not follow-up with urology after last prostatitis. Patient declined a DRE today. Urinalysis today shows some blood.  We will send a UA, urine culture.  Start empiric Bactrim DS.  See AVS. Referral to enter for urology, strongly encouraged to keep the appointment and call if he doesn't hear from urology. Headache: Neurological exam is nonfocal, not the worst headache of his life, unclear etiology, recommend supportive treatment with Tylenol and Excedrin and call if something is different or if he has severe headache.

## 2018-04-28 ENCOUNTER — Other Ambulatory Visit: Payer: Self-pay | Admitting: Internal Medicine

## 2018-05-04 ENCOUNTER — Ambulatory Visit: Payer: Medicare Other | Admitting: Internal Medicine

## 2018-05-10 ENCOUNTER — Telehealth: Payer: Self-pay | Admitting: Internal Medicine

## 2018-05-11 NOTE — Telephone Encounter (Signed)
Pt is requesting refill on Ambien 10mg .   Last OV: 04/20/2018 Last Fill: 12/01/2017 #30 and 3RF UDS: None needed for Ambien  NCCR printed- no discrepancies noted- sent for scanning

## 2018-05-11 NOTE — Telephone Encounter (Signed)
Sent!

## 2018-05-14 DIAGNOSIS — N419 Inflammatory disease of prostate, unspecified: Secondary | ICD-10-CM | POA: Diagnosis not present

## 2018-05-14 DIAGNOSIS — N401 Enlarged prostate with lower urinary tract symptoms: Secondary | ICD-10-CM | POA: Diagnosis not present

## 2018-05-14 DIAGNOSIS — R3129 Other microscopic hematuria: Secondary | ICD-10-CM | POA: Diagnosis not present

## 2018-05-14 DIAGNOSIS — R972 Elevated prostate specific antigen [PSA]: Secondary | ICD-10-CM | POA: Diagnosis not present

## 2018-06-02 ENCOUNTER — Encounter: Payer: Self-pay | Admitting: Internal Medicine

## 2018-06-02 ENCOUNTER — Ambulatory Visit (INDEPENDENT_AMBULATORY_CARE_PROVIDER_SITE_OTHER): Payer: Medicare Other | Admitting: Internal Medicine

## 2018-06-02 VITALS — BP 126/74 | HR 62 | Temp 98.4°F | Resp 16 | Ht 70.0 in | Wt 320.1 lb

## 2018-06-02 DIAGNOSIS — F329 Major depressive disorder, single episode, unspecified: Secondary | ICD-10-CM

## 2018-06-02 DIAGNOSIS — M4800 Spinal stenosis, site unspecified: Secondary | ICD-10-CM

## 2018-06-02 DIAGNOSIS — I10 Essential (primary) hypertension: Secondary | ICD-10-CM | POA: Diagnosis not present

## 2018-06-02 DIAGNOSIS — Z09 Encounter for follow-up examination after completed treatment for conditions other than malignant neoplasm: Secondary | ICD-10-CM | POA: Diagnosis not present

## 2018-06-02 DIAGNOSIS — Z23 Encounter for immunization: Secondary | ICD-10-CM | POA: Diagnosis not present

## 2018-06-02 DIAGNOSIS — G4700 Insomnia, unspecified: Secondary | ICD-10-CM | POA: Diagnosis not present

## 2018-06-02 DIAGNOSIS — F419 Anxiety disorder, unspecified: Secondary | ICD-10-CM

## 2018-06-02 DIAGNOSIS — E785 Hyperlipidemia, unspecified: Secondary | ICD-10-CM

## 2018-06-02 MED ORDER — ZOSTER VAC RECOMB ADJUVANTED 50 MCG/0.5ML IM SUSR: 0.5000 mL | Freq: Once | INTRAMUSCULAR | 1 refills | Status: AC

## 2018-06-02 MED ORDER — GABAPENTIN 800 MG PO TABS: 800.0000 mg | ORAL_TABLET | Freq: Four times a day (QID) | ORAL | 1 refills | Status: DC

## 2018-06-02 NOTE — Patient Instructions (Addendum)
Please schedule Medicare Wellness with Glenard Haring.   GO TO THE LAB : Get the blood work     GO TO THE FRONT DESK Schedule your next appointment for a  Check up in 4 months

## 2018-06-02 NOTE — Progress Notes (Addendum)
Subjective:    Patient ID: Robert Gibbs, male    DOB: [birth year], 67 y.o.   MRN: [id number]  DOS:  06/02/2018 Type of visit - description : f/u Several issues were discussed today. He continue with back pain, feet pain. PHQ 9: Scored 6.  He is not completely well.   Review of Systems Currently with no fever chills.  Still has some urinary frequency. Was seen with a headache, see last visit, resolved  Past Medical History:  Diagnosis Date  . Anxiety and depression   . Arthritis   . Barrett esophagus   . BPH (benign prostatic hyperplasia)   . CTS (carpal tunnel syndrome)    h/o  . Edema of both legs    legs , ankles and feet  . Erectile dysfunction   . GERD (gastroesophageal reflux disease) 03/18/2012  . H/O hiatal hernia   . HTN (hypertension)   . Hyperlipidemia   . Microscopic hematuria   . OSA (obstructive sleep apnea) 03/18/2012   on BiPap  . Prostatitis   . PVD (peripheral vascular disease) ([place])   . Spinal stenosis 06-2012   saw neuro w/ "neuropathy", dx w/ spinal stenosis  . Varicose veins   . Vitamin D deficiency   . Wears glasses     Past Surgical History:  Procedure Laterality Date  . ARCH BAR REMOVAL    . BACK SURGERY  2002   L4-L5  . [place]   for tennis elbow-rt  . ESOPHAGOGASTRODUODENOSCOPY  04/2015   w/ biopsy, short tongue of Barrett's without obvious nodularity, mass or esophagitis seen  . EYE SURGERY Right    cataract with lens implant  . FOOT SURGERY     R, for plantar fasciitis  . HERNIA REPAIR     x 2 as a child-ing   . KNEE ARTHROSCOPY WITH MENISCAL REPAIR Bilateral 05/04/2013   Procedure: BILATERAL KNEE ARTHROSCOPY WITH POSSIBLE CHONDROPLASTY AND MENISCECTOMY ;  Surgeon: Kerin Salen, MD;  Location: [facility];  Service: Orthopedics;  Laterality: Bilateral;  Right chrondroplasty, debridement, , partial medial menisectomy, removal of loose bodies.   Marland Kitchen LIGATION / DIVISION SAPHENOUS VEIN     both legs  .  PROSTATE BIOPSY     2010, (-)  . TONSILLECTOMY    . TOTAL KNEE ARTHROPLASTY Right 11/07/2013   Procedure: TOTAL KNEE ARTHROPLASTY;  Surgeon: Kerin Salen, MD;  Location: [facility];  Service: Orthopedics;  Laterality: Right;  . TOTAL KNEE ARTHROPLASTY Left 03/01/2014   Procedure: TOTAL KNEE ARTHROPLASTY;  Surgeon: Kerin Salen, MD;  Location: [facility];  Service: Orthopedics;  Laterality: Left;  . UPPER GASTROINTESTINAL ENDOSCOPY    . UVULOPALATOPHARYNGOPLASTY (UPPP)/TONSILLECTOMY/SEPTOPLASTY  1990    Social History   Socioeconomic History  . Marital status: Divorced    Spouse name: Not on file  . Number of children: 1  . Years of education: Not on file  . Highest education level: Not on file  Occupational History  . Occupation: retired   Scientific laboratory technician  . Financial resource strain: Not on file  . Food insecurity:    Worry: Not on file    Inability: Not on file  . Transportation needs:    Medical: Not on file    Non-medical: Not on file  Tobacco Use  . Smoking status: Former Smoker    Packs/day: 1.00    Years: 40.00    Pack years: 40.00    Start date: 1972  Last attempt to quit: 06/29/2008    Years since quitting: 9.9  . Smokeless tobacco: Never Used  . Tobacco comment: quit 2010  Substance and Sexual Activity  . Alcohol use: Yes    Alcohol/week: 3.0 standard drinks    Types: 3 Standard drinks or equivalent per week    Comment: 2-3 large drinks, Vodka, 2-3 times a week  . Drug use: No  . Sexual activity: Yes  Lifestyle  . Physical activity:    Days per week: Not on file    Minutes per session: Not on file  . Stress: Not on file  Relationships  . Social connections:    Talks on phone: Not on file    Gets together: Not on file    Attends religious service: Not on file    Active member of club or organization: Not on file    Attends meetings of clubs or organizations: Not on file    Relationship status: Not on file  . Intimate partner violence:    Fear of current or ex  partner: Not on file    Emotionally abused: Not on file    Physically abused: Not on file    Forced sexual activity: Not on file  Other Topics Concern  . Not on file  Social History Narrative   Lives w/  fiance of > 20 years              Allergies as of 06/02/2018      Reactions   Codeine Itching   Nsaids    Acid Reflux   Simvastatin    Mimics heart attack   Tolmetin    Acid Reflux      Medication List       Accurate as of June 02, 2018 11:59 PM. Always use your most recent med list.        albuterol 108 (90 Base) MCG/ACT inhaler Commonly known as:  VENTOLIN HFA Inhale 2 puffs into the lungs every 6 (six) hours as needed for wheezing or shortness of breath.   aspirin 81 MG tablet Take 81 mg by mouth daily.   b complex vitamins capsule Take 1 capsule by mouth daily.   buPROPion 300 MG 24 hr tablet Commonly known as:  WELLBUTRIN XL Take 1 tablet (300 mg total) by mouth daily.   CALCIUM 1200 1200-1000 MG-UNIT Chew Chew 1 tablet by mouth daily.   Coenzyme Q10 200 MG capsule Take 200 mg by mouth daily.   cyclobenzaprine 10 MG tablet Commonly known as:  FLEXERIL Take 1 tablet (10 mg total) by mouth 2 (two) times daily as needed for muscle spasms.   escitalopram 20 MG tablet Commonly known as:  LEXAPRO Take 1 tablet (20 mg total) by mouth daily.   FLEX OMEGA BENEFITS/VIT D-3 PO Take 2 capsules by mouth daily.   gabapentin 800 MG tablet Commonly known as:  NEURONTIN Take 1 tablet (800 mg total) by mouth 4 (four) times daily.   lisinopril 40 MG tablet Commonly known as:  PRINIVIL,ZESTRIL Take 1 tablet (40 mg total) by mouth daily.   magnesium oxide 400 MG tablet Commonly known as:  MAG-OX Take 400 mg by mouth daily.   Melatonin 5 MG Caps Take 10 mg by mouth at bedtime. Patient stated that he is taking 10 mg daily   metoprolol succinate 100 MG 24 hr tablet Commonly known as:  TOPROL-XL TAKE 1 TABLET BY MOUTH DAILY WITH OR IMMEDIATELY FOLLOWING  A MEAL   multivitamin with minerals  tablet Take 1 tablet by mouth daily.   NASACORT ALLERGY 24HR 55 MCG/ACT Aero nasal inhaler Generic drug:  triamcinolone Place 2 sprays into the nose at bedtime.   omeprazole 40 MG capsule Commonly known as:  PRILOSEC Take 1 capsule (40 mg total) by mouth daily.   vitamin C 1000 MG tablet Take 1,000 mg by mouth daily.   zolpidem 10 MG tablet Commonly known as:  AMBIEN TAKE 1/2 TO 1 TABLET(5 TO 10 MG) BY MOUTH AT BEDTIME AS NEEDED FOR SLEEP   Zoster Vaccine Adjuvanted injection Commonly known as:  SHINGRIX Inject 0.5 mLs into the muscle once for 1 dose.           Objective:   Physical Exam BP 126/74 (BP Location: Left Arm, Patient Position: Sitting, Cuff Size: Normal)   Pulse 62   Temp 98.4 F (36.9 C) (Oral)   Resp 16   Ht 5\' 10"  (1.778 m)   Wt (!) 320 lb 2 oz (145.2 kg)   SpO2 95%   BMI 45.93 kg/m  General:   Well developed, NAD, BMI noted. HEENT:  Normocephalic . Face symmetric, atraumatic Lungs:  CTA B Normal respiratory effort, no intercostal retractions, no accessory muscle use. Heart: RRR,  no murmur.  Trace pretibial edema bilaterally  Skin: Not pale. Not jaundice Neurologic:  alert & oriented X3.  Speech normal, gait somewhat limited by pain and BMI Psych--  Cognition and judgment appear intact.  Cooperative with normal attention span and concentration.  Behavior appropriate. No anxious or depressed appearing.      Assessment & Plan:    Assessment Hyperglycemia HTN Hyperlipidemia  Chronic lower extremity edema w/ some pretibial redness  (ER 2-17, [place] neg DVT, BNP wnl) anxiety depression, insomnia COPD: Per CT 03-2016 GI: GERD, Barrett's  esophagus OSA, BiPAP, per pulmonary GU: Dr Felipa Eth  --LUTS , failed a # of medications --BPH, elevated PSA:  (-)  biopsy 2009, PSA 05/2014 2.92 --ED --Hematuria, CT, cystoscopy 2009 negative Vitamin D deficiency-  MSK: -- Spinal stenosis : saw neurology w/ sx of  paresthesias feet-ankle, sx felt to be d/t spinal stenosis not neuropathy, see OV note 08-07-2012 , used to see pain med (Dr Vira Blanco) -High Risk UDS 2016 --CTS Diastasis recti  PLAN Prostatitis: Since the last visit, saw urology, they agreed with the diagnosis of prostatitis, still taking antibiotics, clinically improving. Depression, anxiety, insomnia.:  Currently on Wellbutrin, Lexapro, Ambien.  PHQ 9 scored 6.  Would like to get better control, he reports some issues with her long-term girlfriend b/c he is unable to do much d/t pain and LE edema.  For now we agreed on same meds but encouraged formal counseling. Spinal stenosis, chronic back pain: Symptoms suboptimally controlled, continue with gabapentin; chronic pain is affecting his mood (see above)  I offered him to revisit the issue by the specialist, he agreed, refer to [facility]. GERD: Got a phone call from GI, planning to call them back.   Preventive care: Flu shot today, Shingrix prescription provided. RTC 4 months   Today, I spent more than 25   min with the patient: >50% of the time counseling regards depression, anxiety, discussing possible reassessment of chronic pain.  I also provided listening therapy for anxiety and depression.

## 2018-06-02 NOTE — Progress Notes (Signed)
Pre visit review using our clinic review tool, if applicable. No additional management support is needed unless otherwise documented below in the visit note. 

## 2018-06-03 DIAGNOSIS — G4700 Insomnia, unspecified: Secondary | ICD-10-CM | POA: Insufficient documentation

## 2018-06-03 LAB — BASIC METABOLIC PANEL
BUN: 13 mg/dL (ref 6–23)
CO2: 31 mEq/L (ref 19–32)
Calcium: 8.6 mg/dL (ref 8.4–10.5)
Chloride: 103 mEq/L (ref 96–112)
Creatinine, Ser: 0.86 mg/dL (ref 0.40–1.50)
GFR: 94.1 mL/min (ref >60.00)
Glucose, Bld: 98 mg/dL (ref 70–99)
Potassium: 4.1 mEq/L (ref 3.5–5.1)
Sodium: 140 mEq/L (ref 135–145)

## 2018-06-03 LAB — LIPID PANEL
Cholesterol: 167 mg/dL (ref 0–200)
HDL: 43.3 mg/dL (ref >39.00)
LDL Cholesterol: 106 mg/dL — ABNORMAL HIGH (ref 0–99)
NonHDL: 123.37
Total CHOL/HDL Ratio: 4
Triglycerides: 88 mg/dL (ref 0.0–149.0)
VLDL: 17.6 mg/dL (ref 0.0–40.0)

## 2018-06-03 NOTE — Assessment & Plan Note (Addendum)
Prostatitis: Since the last visit, saw urology, they agreed with the diagnosis of prostatitis, still taking antibiotics, clinically improving. Depression, anxiety, insomnia.:  Currently on Wellbutrin, Lexapro, Ambien.  PHQ 9 scored 6.  Would like to get better control, he reports some issues with her long-term girlfriend b/c he is unable to do much d/t pain and LE edema.  For now we agreed on same meds but encouraged formal counseling. Spinal stenosis, chronic back pain: Symptoms suboptimally controlled, continue with gabapentin; chronic pain is affecting his mood (see above)  I offered him to revisit the issue by the specialist, he agreed, refer to [facility]. GERD: Got a phone call from GI, planning to call them back.   Preventive care: Flu shot today, Shingrix prescription provided. RTC 4 months

## 2018-06-09 ENCOUNTER — Other Ambulatory Visit: Payer: Self-pay | Admitting: Internal Medicine

## 2018-06-09 ENCOUNTER — Telehealth: Payer: Self-pay | Admitting: Internal Medicine

## 2018-06-09 MED ORDER — BUPROPION HCL ER (XL) 300 MG PO TB24: 300.0000 mg | ORAL_TABLET | Freq: Every day | ORAL | 1 refills | Status: DC

## 2018-06-09 MED ORDER — GABAPENTIN 800 MG PO TABS: 800.0000 mg | ORAL_TABLET | Freq: Four times a day (QID) | ORAL | 1 refills | Status: DC

## 2018-06-09 NOTE — Telephone Encounter (Signed)
Copied from [facility] #[id number]. Topic: Quick Communication - Rx Refill/Question >> Jun 09, 2018  3:46 PM [name], NT wrote: Medication:  gabapentin (NEURONTIN) 800 MG tablet   Has the patient contacted their pharmacy? yes (Agent: If no, request that the patient contact the pharmacy for the refill. (Agent: If yes, when and what did the pharmacy advise?  Preferred Pharmacy (with phone number or street name [id number] - [place], [place] - [address] AT [address] [phone] (Phone) [phone] (Fax)    Agent: Please be advised that RX refills may take up to 3 business days. We ask that you follow-up with your pharmacy. Patient says he cancelled the request that was sent to [facility] and would like this filled  By the above pharmacy instead

## 2018-06-09 NOTE — Telephone Encounter (Signed)
Copied from [place] [id number]. Topic: Quick Communication - See Telephone Encounter >> Jun 09, 2018  2:51 PM Conception Chancy, NT wrote: CRM for notification. See Telephone encounter for: 06/09/18.'  Patient had her physical on buPROPion (WELLBUTRIN XL) 300 MG 24 hr tablet and states he forgot to mention this at his visit.  [id number] - [place], [place] - [address] [place] [address] Phone: [phone] Fax: [phone]

## 2018-06-09 NOTE — Telephone Encounter (Signed)
Rx sent 

## 2018-06-11 NOTE — Telephone Encounter (Signed)
Patient would like Dr Larose Kells to call and cancel his script at [facility] because he did receive it in the mail today through [facility]. Thank you.

## 2018-06-11 NOTE — Telephone Encounter (Signed)
Pt can call [facility] and inform he no longer needs.

## 2018-06-29 DIAGNOSIS — N419 Inflammatory disease of prostate, unspecified: Secondary | ICD-10-CM | POA: Diagnosis not present

## 2018-06-29 DIAGNOSIS — R339 Retention of urine, unspecified: Secondary | ICD-10-CM | POA: Diagnosis not present

## 2018-06-29 DIAGNOSIS — R972 Elevated prostate specific antigen [PSA]: Secondary | ICD-10-CM | POA: Diagnosis not present

## 2018-06-29 DIAGNOSIS — N401 Enlarged prostate with lower urinary tract symptoms: Secondary | ICD-10-CM | POA: Diagnosis not present

## 2018-06-29 DIAGNOSIS — R350 Frequency of micturition: Secondary | ICD-10-CM | POA: Diagnosis not present

## 2018-06-29 DIAGNOSIS — R3129 Other microscopic hematuria: Secondary | ICD-10-CM | POA: Diagnosis not present

## 2018-06-29 DIAGNOSIS — R3912 Poor urinary stream: Secondary | ICD-10-CM | POA: Diagnosis not present

## 2018-06-29 DIAGNOSIS — N529 Male erectile dysfunction, unspecified: Secondary | ICD-10-CM | POA: Diagnosis not present

## 2018-06-29 DIAGNOSIS — N281 Cyst of kidney, acquired: Secondary | ICD-10-CM | POA: Diagnosis not present

## 2018-07-12 ENCOUNTER — Other Ambulatory Visit: Payer: Self-pay | Admitting: Internal Medicine

## 2018-07-22 ENCOUNTER — Telehealth: Payer: Self-pay

## 2018-07-22 DIAGNOSIS — K22719 Barrett's esophagus with dysplasia, unspecified: Secondary | ICD-10-CM

## 2018-07-22 DIAGNOSIS — Z1211 Encounter for screening for malignant neoplasm of colon: Secondary | ICD-10-CM

## 2018-07-22 NOTE — Telephone Encounter (Signed)
Copied from [place] [id number]. Topic: Referral - Request for Referral >> Jul 22, 2018  3:23 PM Sheran Luz wrote: Has patient seen PCP for this complaint? Patient states that he has spoken with Dr. Larose Kells about this previously  Referral for which specialty: gastroenterologist  Preferred provider/office: No preference  Reason for referral: Barrett's disease- Requesting endoscopy

## 2018-07-22 NOTE — Telephone Encounter (Signed)
Referral placed.

## 2018-08-09 ENCOUNTER — Other Ambulatory Visit: Payer: Self-pay | Admitting: Internal Medicine

## 2018-08-18 ENCOUNTER — Other Ambulatory Visit: Payer: Self-pay | Admitting: Internal Medicine

## 2018-10-05 ENCOUNTER — Ambulatory Visit: Payer: Medicare Other | Admitting: Internal Medicine

## 2018-10-07 ENCOUNTER — Telehealth: Payer: Self-pay | Admitting: Internal Medicine

## 2018-10-08 NOTE — Telephone Encounter (Signed)
Pt is requesting refill on Ambien.   Last OV: 06/02/2018 Last Fill: 05/11/2018 #30 and 1RF UDS: Ambien only

## 2018-10-08 NOTE — Telephone Encounter (Signed)
Sent!

## 2018-10-27 DIAGNOSIS — Z86010 Personal history of colonic polyps: Secondary | ICD-10-CM | POA: Diagnosis not present

## 2018-10-27 DIAGNOSIS — K22719 Barrett's esophagus with dysplasia, unspecified: Secondary | ICD-10-CM | POA: Diagnosis not present

## 2018-10-27 DIAGNOSIS — K219 Gastro-esophageal reflux disease without esophagitis: Secondary | ICD-10-CM | POA: Diagnosis not present

## 2018-11-23 ENCOUNTER — Other Ambulatory Visit: Payer: Self-pay | Admitting: Internal Medicine

## 2018-11-24 ENCOUNTER — Other Ambulatory Visit: Payer: Self-pay | Admitting: Internal Medicine

## 2018-12-02 DIAGNOSIS — D126 Benign neoplasm of colon, unspecified: Secondary | ICD-10-CM | POA: Diagnosis not present

## 2018-12-02 DIAGNOSIS — Z86010 Personal history of colonic polyps: Secondary | ICD-10-CM | POA: Diagnosis not present

## 2018-12-02 DIAGNOSIS — K2270 Barrett's esophagus without dysplasia: Secondary | ICD-10-CM | POA: Diagnosis not present

## 2018-12-02 DIAGNOSIS — D124 Benign neoplasm of descending colon: Secondary | ICD-10-CM | POA: Diagnosis not present

## 2018-12-02 DIAGNOSIS — K640 First degree hemorrhoids: Secondary | ICD-10-CM | POA: Diagnosis not present

## 2018-12-02 DIAGNOSIS — K5730 Diverticulosis of large intestine without perforation or abscess without bleeding: Secondary | ICD-10-CM | POA: Diagnosis not present

## 2018-12-02 DIAGNOSIS — Z1211 Encounter for screening for malignant neoplasm of colon: Secondary | ICD-10-CM | POA: Diagnosis not present

## 2018-12-02 DIAGNOSIS — K641 Second degree hemorrhoids: Secondary | ICD-10-CM | POA: Diagnosis not present

## 2018-12-02 DIAGNOSIS — D122 Benign neoplasm of ascending colon: Secondary | ICD-10-CM | POA: Diagnosis not present

## 2018-12-02 LAB — HM COLONOSCOPY

## 2018-12-10 ENCOUNTER — Encounter: Payer: Self-pay | Admitting: Internal Medicine

## 2018-12-29 ENCOUNTER — Other Ambulatory Visit: Payer: Self-pay | Admitting: Internal Medicine

## 2018-12-31 ENCOUNTER — Ambulatory Visit (INDEPENDENT_AMBULATORY_CARE_PROVIDER_SITE_OTHER): Payer: Medicare Other | Admitting: Internal Medicine

## 2018-12-31 ENCOUNTER — Other Ambulatory Visit: Payer: Self-pay

## 2018-12-31 DIAGNOSIS — F329 Major depressive disorder, single episode, unspecified: Secondary | ICD-10-CM

## 2018-12-31 DIAGNOSIS — I10 Essential (primary) hypertension: Secondary | ICD-10-CM | POA: Diagnosis not present

## 2018-12-31 DIAGNOSIS — M4800 Spinal stenosis, site unspecified: Secondary | ICD-10-CM | POA: Diagnosis not present

## 2018-12-31 DIAGNOSIS — F419 Anxiety disorder, unspecified: Secondary | ICD-10-CM

## 2018-12-31 DIAGNOSIS — R739 Hyperglycemia, unspecified: Secondary | ICD-10-CM

## 2018-12-31 MED ORDER — LISINOPRIL 40 MG PO TABS: ORAL_TABLET | ORAL | 1 refills | Status: DC

## 2018-12-31 NOTE — Progress Notes (Signed)
Subjective:    Patient ID: Robert Gibbs, male    DOB: Jul 25, 1950, 68 y.o.   MRN: [id number]  DOS:  12/31/2018 Type of visit - description: Virtual Visit via Video Note  I connected with@ on 01/02/19 at  3:20 PM EDT by a video enabled telemedicine application and verified that I am speaking with the correct person using two identifiers.   THIS ENCOUNTER IS A VIRTUAL VISIT DUE TO COVID-19 - PATIENT WAS NOT SEEN IN THE OFFICE. PATIENT HAS CONSENTED TO VIRTUAL VISIT / TELEMEDICINE VISIT   Location of patient: home  Location of provider: office  I discussed the limitations of evaluation and management by telemedicine and the availability of in person appointments. The patient expressed understanding and agreed to proceed.  History of Present Illness: Follow-up The patient needs a refill on lisinopril. We review his recent labs. We reviewed his medication list, he has decrease his intake of gabapentin to twice a day to see if he can manage spinal stenosis with less gabapentin. Ambulatory blood pressures are normal Anxiety depression insomnia: Controlled with current meds.    Review of Systems  Denies fever chills No chest pain or difficulty breathing No nausea, vomiting, diarrhea  Past Medical History:  Diagnosis Date  . Anxiety and depression   . Arthritis   . Barrett esophagus   . BPH (benign prostatic hyperplasia)   . CTS (carpal tunnel syndrome)    h/o  . Edema of both legs    legs , ankles and feet  . Erectile dysfunction   . GERD (gastroesophageal reflux disease) 03/18/2012  . H/O hiatal hernia   . HTN (hypertension)   . Hyperlipidemia   . Microscopic hematuria   . OSA (obstructive sleep apnea) 03/18/2012   on BiPap  . Prostatitis   . PVD (peripheral vascular disease) ([place])   . Spinal stenosis 06-2012   saw neuro w/ "neuropathy", dx w/ spinal stenosis  . Varicose veins   . Vitamin D deficiency   . Wears glasses     Past Surgical History:  Procedure  Laterality Date  . ARCH BAR REMOVAL    . BACK SURGERY  2002   L4-L5  . [place]   for tennis elbow-rt  . ESOPHAGOGASTRODUODENOSCOPY  04/2015   w/ biopsy, short tongue of Barrett's without obvious nodularity, mass or esophagitis seen  . EYE SURGERY Right    cataract with lens implant  . FOOT SURGERY     R, for plantar fasciitis  . HERNIA REPAIR     x 2 as a child-ing   . KNEE ARTHROSCOPY WITH MENISCAL REPAIR Bilateral 05/04/2013   Procedure: BILATERAL KNEE ARTHROSCOPY WITH POSSIBLE CHONDROPLASTY AND MENISCECTOMY ;  Surgeon: Kerin Salen, MD;  Location: [place];  Service: Orthopedics;  Laterality: Bilateral;  Right chrondroplasty, debridement, , partial medial menisectomy, removal of loose bodies.   Marland Kitchen LIGATION / DIVISION SAPHENOUS VEIN     both legs  . PROSTATE BIOPSY     2010, (-)  . TONSILLECTOMY    . TOTAL KNEE ARTHROPLASTY Right 11/07/2013   Procedure: TOTAL KNEE ARTHROPLASTY;  Surgeon: Kerin Salen, MD;  Location: [place];  Service: Orthopedics;  Laterality: Right;  . TOTAL KNEE ARTHROPLASTY Left 03/01/2014   Procedure: TOTAL KNEE ARTHROPLASTY;  Surgeon: Kerin Salen, MD;  Location: [place];  Service: Orthopedics;  Laterality: Left;  . UPPER GASTROINTESTINAL ENDOSCOPY    . UVULOPALATOPHARYNGOPLASTY (UPPP)/TONSILLECTOMY/SEPTOPLASTY  1990    Social History  Socioeconomic History  . Marital status: Divorced    Spouse name: Not on file  . Number of children: 1  . Years of education: Not on file  . Highest education level: Not on file  Occupational History  . Occupation: retired   Scientific laboratory technician  . Financial resource strain: Not on file  . Food insecurity    Worry: Not on file    Inability: Not on file  . Transportation needs    Medical: Not on file    Non-medical: Not on file  Tobacco Use  . Smoking status: Former Smoker    Packs/day: 1.00    Years: 40.00    Pack years: 40.00    Start date: 32    Quit date: 06/29/2008    Years since  quitting: 10.5  . Smokeless tobacco: Never Used  . Tobacco comment: quit 2010  Substance and Sexual Activity  . Alcohol use: Yes    Alcohol/week: 3.0 standard drinks    Types: 3 Standard drinks or equivalent per week    Comment: 2-3 large drinks, Vodka, 2-3 times a week  . Drug use: No  . Sexual activity: Yes  Lifestyle  . Physical activity    Days per week: Not on file    Minutes per session: Not on file  . Stress: Not on file  Relationships  . Social Herbalist on phone: Not on file    Gets together: Not on file    Attends religious service: Not on file    Active member of club or organization: Not on file    Attends meetings of clubs or organizations: Not on file    Relationship status: Not on file  . Intimate partner violence    Fear of current or ex partner: Not on file    Emotionally abused: Not on file    Physically abused: Not on file    Forced sexual activity: Not on file  Other Topics Concern  . Not on file  Social History Narrative   Lives w/  fiance of > 20 years              Allergies as of 12/31/2018      Reactions   Codeine Itching   Nsaids    Acid Reflux   Simvastatin    Mimics heart attack   Tolmetin    Acid Reflux      Medication List       Accurate as of December 31, 2018 11:59 PM. If you have any questions, ask your nurse or doctor.        albuterol 108 (90 Base) MCG/ACT inhaler Commonly known as: Ventolin HFA Inhale 2 puffs into the lungs every 6 (six) hours as needed for wheezing or shortness of breath.   aspirin 81 MG tablet Take 81 mg by mouth daily.   b complex vitamins capsule Take 1 capsule by mouth daily.   buPROPion 300 MG 24 hr tablet Commonly known as: WELLBUTRIN XL Take 1 tablet (300 mg total) by mouth daily.   Calcium 1200 1200-1000 MG-UNIT Chew Chew 1 tablet by mouth daily.   Coenzyme Q10 200 MG capsule Take 200 mg by mouth daily.   cyclobenzaprine 10 MG tablet Commonly known as: FLEXERIL Take 1 tablet  (10 mg total) by mouth 2 (two) times daily as needed for muscle spasms.   escitalopram 20 MG tablet Commonly known as: LEXAPRO Take 1 tablet (20 mg total) by mouth daily.   FLEX OMEGA  BENEFITS/VIT D-3 PO Take 2 capsules by mouth daily.   gabapentin 800 MG tablet Commonly known as: NEURONTIN Take 1 tablet (800 mg total) by mouth 2 (two) times daily. What changed: when to take this Changed by: Kathlene November, MD   lisinopril 40 MG tablet Commonly known as: ZESTRIL TAKE 1 TABLET(40 MG) BY MOUTH DAILY   magnesium oxide 400 MG tablet Commonly known as: MAG-OX Take 400 mg by mouth daily.   Melatonin 5 MG Caps Take 10 mg by mouth at bedtime. Patient stated that he is taking 10 mg daily   metoprolol succinate 100 MG 24 hr tablet Commonly known as: TOPROL-XL Take 1 tablet (100 mg total) by mouth daily. Take with or immediately following a meal.   multivitamin with minerals tablet Take 1 tablet by mouth daily.   Nasacort Allergy 24HR 55 MCG/ACT Aero nasal inhaler Generic drug: triamcinolone Place 2 sprays into the nose at bedtime.   omeprazole 40 MG capsule Commonly known as: PRILOSEC Take 1 capsule (40 mg total) by mouth daily.   vitamin C 1000 MG tablet Take 1,000 mg by mouth daily.   zolpidem 10 MG tablet Commonly known as: AMBIEN TAKE 1/2 TO 1 TABLET(5 TO 10 MG) BY MOUTH AT BEDTIME AS NEEDED FOR SLEEP           Objective:   Physical Exam There were no vitals taken for this visit. This is a virtual video visit, he is alert oriented x3, no apparent distress.    Assessment     Assessment Hyperglycemia HTN Hyperlipidemia  Chronic lower extremity edema w/ some pretibial redness  (ER 2-17, [place] neg DVT, BNP wnl) anxiety depression, insomnia COPD: Per CT 03-2016 GI: GERD, Barrett's  esophagus OSA, BiPAP, per pulmonary GU: Dr Felipa Eth  --LUTS , failed a # of medications --BPH, elevated PSA:  (-)  biopsy 2009, PSA 05/2014 2.92 --ED --Hematuria, CT, cystoscopy 2009  negative Vitamin D deficiency-  MSK: -- Spinal stenosis : saw neurology w/ sx of paresthesias feet-ankle, sx felt to be d/t spinal stenosis not neuropathy, see OV note 08-07-2012 , used to see pain med (Dr Vira Blanco) -High Risk UDS 2016 --CTS Diastasis recti  PLAN Hyperglycemia: Diet controlled, check A1c HTN: Currently on lisinopril, Toprol.  Check a BMP and CBC, refill lisinopril Spinal stenosis: Has self decrease gabapentin to twice daily.  Med list updated. Anxiety, depression, insomnia: Currently on Wellbutrin, Lexapro, Ambien.  Controlled. Preventive care: Recommend early high-dose flu shot this year RTC: No fasting labs next week CPX in 4 to 5 months.  Will call and arrange.      I discussed the assessment and treatment plan with the patient. The patient was provided an opportunity to ask questions and all were answered. The patient agreed with the plan and demonstrated an understanding of the instructions.   The patient was advised to call back or seek an in-person evaluation if the symptoms worsen or if the condition fails to improve as anticipated.

## 2019-01-01 ENCOUNTER — Encounter: Payer: Self-pay | Admitting: Internal Medicine

## 2019-01-02 NOTE — Assessment & Plan Note (Signed)
Hyperglycemia: Diet controlled, check A1c HTN: Currently on lisinopril, Toprol.  Check a BMP and CBC, refill lisinopril Spinal stenosis: Has self decrease gabapentin to twice daily.  Med list updated. Anxiety, depression, insomnia: Currently on Wellbutrin, Lexapro, Ambien.  Controlled. Preventive care: Recommend early high-dose flu shot this year RTC: No fasting labs next week CPX in 4 to 5 months.  Will call and arrange.

## 2019-01-03 ENCOUNTER — Other Ambulatory Visit: Payer: Self-pay | Admitting: Internal Medicine

## 2019-01-04 ENCOUNTER — Other Ambulatory Visit: Payer: Self-pay | Admitting: Internal Medicine

## 2019-01-05 ENCOUNTER — Telehealth: Payer: Self-pay

## 2019-01-05 DIAGNOSIS — Z122 Encounter for screening for malignant neoplasm of respiratory organs: Secondary | ICD-10-CM

## 2019-01-05 NOTE — Telephone Encounter (Signed)
He actually is due for a low-dose CT chest for lung cancer screening, my intention was to order it 06-2017 but apparently I  didn't. Please arrange lung cancer screening CT. A chest x-ray is not necessary

## 2019-01-05 NOTE — Telephone Encounter (Signed)
Copied from [place] [phone]. Topic: General - Other >> Jan 05, 2019  1:52 PM Yvette Rack wrote: Reason for CRM: Pt stated he would like for a message to be sent to Dr. Larose Kells asking if a chest x-ray is needed due to him previously being a smoker. Pt requests call back.

## 2019-01-05 NOTE — Telephone Encounter (Signed)
Please advise 

## 2019-01-06 NOTE — Telephone Encounter (Signed)
Order placed

## 2019-01-07 ENCOUNTER — Ambulatory Visit (HOSPITAL_BASED_OUTPATIENT_CLINIC_OR_DEPARTMENT_OTHER): Payer: Medicare Other

## 2019-01-20 ENCOUNTER — Other Ambulatory Visit: Payer: Self-pay

## 2019-01-26 ENCOUNTER — Other Ambulatory Visit: Payer: Self-pay | Admitting: Internal Medicine

## 2019-03-21 ENCOUNTER — Telehealth: Payer: Self-pay | Admitting: Internal Medicine

## 2019-03-21 NOTE — Telephone Encounter (Signed)
Sent!

## 2019-03-21 NOTE — Telephone Encounter (Signed)
Ambien refill.   Last OV: 12/31/2018 Last Fill: 10/08/2018 #30 and 1RF Pt sig: 1/2 to 1 tab qhs prn UDS: Ambien only

## 2019-03-31 ENCOUNTER — Ambulatory Visit: Payer: Medicare Other

## 2019-04-19 ENCOUNTER — Other Ambulatory Visit: Payer: Self-pay | Admitting: Internal Medicine

## 2019-04-22 ENCOUNTER — Other Ambulatory Visit: Payer: Self-pay

## 2019-04-22 ENCOUNTER — Ambulatory Visit (INDEPENDENT_AMBULATORY_CARE_PROVIDER_SITE_OTHER): Payer: Medicare Other

## 2019-04-22 DIAGNOSIS — Z23 Encounter for immunization: Secondary | ICD-10-CM | POA: Diagnosis not present

## 2019-06-28 ENCOUNTER — Other Ambulatory Visit: Payer: Self-pay | Admitting: Internal Medicine

## 2019-06-30 NOTE — Telephone Encounter (Signed)
Last OV 12/31/18 Last refill 01/27/19 #90/1 Next OV not scheduled

## 2019-07-08 ENCOUNTER — Other Ambulatory Visit: Payer: Self-pay | Admitting: Internal Medicine

## 2019-07-14 ENCOUNTER — Telehealth: Payer: Self-pay | Admitting: Internal Medicine

## 2019-07-14 MED ORDER — OMEPRAZOLE 40 MG PO CPDR: 40.0000 mg | DELAYED_RELEASE_CAPSULE | Freq: Every day | ORAL | 3 refills | Status: DC

## 2019-07-14 NOTE — Telephone Encounter (Signed)
Rx sent to [facility] w/ note for Dr. Eden Emms formula only.

## 2019-07-14 NOTE — Telephone Encounter (Signed)
Pt said that [facility] stopped carrying Dr. Ephriam Jenkins Omeprazole. Pt said that [facility] order has it. He was to get his ONLY his omeprazole that must be written to dispense ONLY Dr. Reddy's at the mail order. This needs to be ordered in a 90 day supply.  Pt asking that RX be sent in for him.  [place], [place] Phone:  [phone]  Fax:  [phone]

## 2019-08-02 ENCOUNTER — Ambulatory Visit (INDEPENDENT_AMBULATORY_CARE_PROVIDER_SITE_OTHER): Payer: Medicare Other | Admitting: Internal Medicine

## 2019-08-02 ENCOUNTER — Encounter: Payer: Self-pay | Admitting: Internal Medicine

## 2019-08-02 ENCOUNTER — Other Ambulatory Visit: Payer: Self-pay

## 2019-08-02 VITALS — Ht 70.0 in

## 2019-08-02 DIAGNOSIS — R600 Localized edema: Secondary | ICD-10-CM

## 2019-08-02 DIAGNOSIS — F419 Anxiety disorder, unspecified: Secondary | ICD-10-CM

## 2019-08-02 DIAGNOSIS — R739 Hyperglycemia, unspecified: Secondary | ICD-10-CM | POA: Diagnosis not present

## 2019-08-02 DIAGNOSIS — F32A Depression, unspecified: Secondary | ICD-10-CM

## 2019-08-02 DIAGNOSIS — Z122 Encounter for screening for malignant neoplasm of respiratory organs: Secondary | ICD-10-CM

## 2019-08-02 DIAGNOSIS — F329 Major depressive disorder, single episode, unspecified: Secondary | ICD-10-CM

## 2019-08-02 DIAGNOSIS — I10 Essential (primary) hypertension: Secondary | ICD-10-CM

## 2019-08-02 DIAGNOSIS — E559 Vitamin D deficiency, unspecified: Secondary | ICD-10-CM | POA: Diagnosis not present

## 2019-08-02 DIAGNOSIS — E785 Hyperlipidemia, unspecified: Secondary | ICD-10-CM | POA: Diagnosis not present

## 2019-08-02 NOTE — Progress Notes (Signed)
Pre visit review using our clinic review tool, if applicable. No additional management support is needed unless otherwise documented below in the visit note. 

## 2019-08-02 NOTE — Progress Notes (Signed)
Subjective:    Patient ID: Robert Gibbs, male    DOB: [birth year], 69 y.o.   MRN: [id number]  DOS:  08/02/2019 Type of visit - description: Virtual Visit via Video Note  I connected with the above patient  by a video enabled telemedicine application and verified that I am speaking with the correct person using two identifiers.   THIS ENCOUNTER IS A VIRTUAL VISIT DUE TO COVID-19 - PATIENT WAS NOT SEEN IN THE OFFICE. PATIENT HAS CONSENTED TO VIRTUAL VISIT / TELEMEDICINE VISIT   Location of patient: home  Location of provider: office  I discussed the limitations of evaluation and management by telemedicine and the availability of in person appointments. The patient expressed understanding and agreed to proceed.  Routine office visit Since the last office visit he is doing about the same. Admits that he has not been very active, weight currently is "more than 300". Insomnia is not well controlled. Good compliance with anxiety/depression medication.  His brother had a stroke, he has been somewhat worried/depressed. HTN: No ambulatory BPs but good compliance with medications. Not taking vitamin D regularly Had a EGD and a colonoscopy few months ago.    Review of Systems Denies fever chills No chest pain, difficulty breathing. Has lower extremity edema: At baseline. L UTS: At baseline  Past Medical History:  Diagnosis Date  . Anxiety and depression   . Arthritis   . Barrett esophagus   . BPH (benign prostatic hyperplasia)   . CTS (carpal tunnel syndrome)    h/o  . Edema of both legs    legs , ankles and feet  . Erectile dysfunction   . GERD (gastroesophageal reflux disease) 03/18/2012  . H/O hiatal hernia   . HTN (hypertension)   . Hyperlipidemia   . Microscopic hematuria   . OSA (obstructive sleep apnea) 03/18/2012   on BiPap  . Prostatitis   . PVD (peripheral vascular disease) ([place])   . Spinal stenosis 06-2012   saw neuro w/ "neuropathy", dx w/ spinal stenosis  .  Varicose veins   . Vitamin D deficiency   . Wears glasses     Past Surgical History:  Procedure Laterality Date  . ARCH BAR REMOVAL    . BACK SURGERY  2002   L4-L5  . [place]   for tennis elbow-rt  . ESOPHAGOGASTRODUODENOSCOPY  04/2015   w/ biopsy, short tongue of Barrett's without obvious nodularity, mass or esophagitis seen  . EYE SURGERY Right    cataract with lens implant  . FOOT SURGERY     R, for plantar fasciitis  . HERNIA REPAIR     x 2 as a child-ing   . KNEE ARTHROSCOPY WITH MENISCAL REPAIR Bilateral 05/04/2013   Procedure: BILATERAL KNEE ARTHROSCOPY WITH POSSIBLE CHONDROPLASTY AND MENISCECTOMY ;  Surgeon: Kerin Salen, MD;  Location: [place];  Service: Orthopedics;  Laterality: Bilateral;  Right chrondroplasty, debridement, , partial medial menisectomy, removal of loose bodies.   Marland Kitchen LIGATION / DIVISION SAPHENOUS VEIN     both legs  . PROSTATE BIOPSY     2010, (-)  . TONSILLECTOMY    . TOTAL KNEE ARTHROPLASTY Right 11/07/2013   Procedure: TOTAL KNEE ARTHROPLASTY;  Surgeon: Kerin Salen, MD;  Location: [place];  Service: Orthopedics;  Laterality: Right;  . TOTAL KNEE ARTHROPLASTY Left 03/01/2014   Procedure: TOTAL KNEE ARTHROPLASTY;  Surgeon: Kerin Salen, MD;  Location: [place];  Service: Orthopedics;  Laterality: Left;  .  UPPER GASTROINTESTINAL ENDOSCOPY    . UVULOPALATOPHARYNGOPLASTY (UPPP)/TONSILLECTOMY/SEPTOPLASTY  1990        Objective:   Physical Exam Ht 5\' 10"  (1.778 m)   BMI 45.93 kg/m  This is a virtual video visit, he is alert oriented x3, in no apparent distress.    Assessment     Assessment Hyperglycemia HTN Hyperlipidemia  Chronic lower extremity edema w/ some pretibial redness  (ER 2-17, [place] neg DVT, BNP wnl) anxiety depression, insomnia COPD: Per CT 03-2016 Morbid obesity GI: GERD, Barrett's  esophagus OSA, BiPAP, per pulmonary GU: Dr Felipa Eth  --LUTS , failed a # of medications --BPH, elevated PSA:  (-)   biopsy 2009, PSA 05/2014 2.92 --ED --Hematuria, CT, cystoscopy 2009 negative Vitamin D deficiency-  MSK: -- Spinal stenosis : saw neurology w/ sx of paresthesias feet-ankle, sx felt to be d/t spinal stenosis not neuropathy, see OV note 08-07-2012 , used to see pain med (Dr Vira Blanco) -High Risk UDS 2016 --CTS Diastasis recti  PLAN Hyperglycemia: Has not made much progress on lifestyle.  Diet controlled.  Check A1c HTN: No ambulatory BPs, currently on Lisinopril, metoprolol.  Check a CMP and CBC Dyslipidemia: Based on the last FLP his CV RF is 15.9%, qualify for statins, currently on no meds.  Check a FLP, he will be agreeable to start medications if needed.  Lower extremity edema: Chronic, at baseline per patient Anxiety, depression, insomnia: On Lexapro, Wellbutrin and occasional Ambien, not sleeping well.  Discussed healthy habits, encouraged to try and gain melatonin nightly for several weeks.  Does not desire to try other medications at this point. Morbid obesity: More than 300 pounds in weight, information about the wellness center will be sent. Spinal stenosis: On gabapentin Barrett's: EGD done 11/2018, report reviewed, next in 3 years per report Vitamin D deficiency: Not on supplements, encouraged 2000 units daily. Preventive care reviewed. Labs in few days RTC 4 months     I discussed the assessment and treatment plan with the patient. The patient was provided an opportunity to ask questions and all were answered. The patient agreed with the plan and demonstrated an understanding of the instructions.   The patient was advised to call back or seek an in-person evaluation if the symptoms worsen or if the condition fails to improve as anticipated.

## 2019-08-03 NOTE — Assessment & Plan Note (Signed)
Preventive care.I review it- Tdap 2014 -  pnm shot 2014 ; prevnar-- 2015 - zostavax  2014   - shingrix discussed before -  got a flu shot  -CCS: cscope 01-2012, bx neg per old records Cscope 11/2018, @ [facility], next 7 years -Prostate ca screening: Sees Dr Felipa Eth (urology) , plans to call and schedule a OV -Lung cancer screening: Negative CT 03-2016 (+ COPD).  Repeat a CT

## 2019-08-03 NOTE — Assessment & Plan Note (Signed)
Hyperglycemia: Has not made much progress on lifestyle.  Diet controlled.  Check A1c HTN: No ambulatory BPs, currently on Lisinopril, metoprolol.  Check a CMP and CBC Dyslipidemia: Based on the last FLP his CV RF is 15.9%, qualify for statins, currently on no meds.  Check a FLP, he will be agreeable to start medications if needed.  Lower extremity edema: Chronic, at baseline per patient Anxiety, depression, insomnia: On Lexapro, Wellbutrin and occasional Ambien, not sleeping well.  Discussed healthy habits, encouraged to try and gain melatonin nightly for several weeks.  Does not desire to try other medications at this point. Morbid obesity: More than 300 pounds in weight, information about the wellness center will be sent. Spinal stenosis: On gabapentin Barrett's: EGD done 11/2018, report reviewed, next in 3 years per report Vitamin D deficiency: Not on supplements, encouraged 2000 units daily. Preventive care reviewed. Labs in few days RTC 4 months

## 2019-08-04 ENCOUNTER — Ambulatory Visit (HOSPITAL_BASED_OUTPATIENT_CLINIC_OR_DEPARTMENT_OTHER)
Admission: RE | Admit: 2019-08-04 | Discharge: 2019-08-04 | Disposition: A | Discharge status: Home / Self Care | Payer: Medicare Other | Source: Ambulatory Visit | Attending: Internal Medicine | Admitting: Internal Medicine

## 2019-08-04 ENCOUNTER — Other Ambulatory Visit: Payer: Self-pay

## 2019-08-04 DIAGNOSIS — Z87891 Personal history of nicotine dependence: Secondary | ICD-10-CM | POA: Insufficient documentation

## 2019-08-04 DIAGNOSIS — Z122 Encounter for screening for malignant neoplasm of respiratory organs: Secondary | ICD-10-CM | POA: Insufficient documentation

## 2019-08-04 NOTE — Telephone Encounter (Signed)
Called pt to [facility] 4 month follow up no answer left msg

## 2019-08-04 NOTE — Telephone Encounter (Signed)
-----   Message from Colon Branch, MD sent at 08/02/2019  2:36 PM EST ----- Regarding: labs K:CMP, CBC: HTNFLP: HyperlipidemiaA1c: HyperglycemiaCT chest, lung cancer screening: DX screeningS:Please to schedule labs and a follow-up in 4 months

## 2019-08-08 ENCOUNTER — Other Ambulatory Visit: Payer: Medicare Other

## 2019-08-31 ENCOUNTER — Other Ambulatory Visit: Payer: Medicare Other

## 2019-09-02 ENCOUNTER — Other Ambulatory Visit: Payer: Self-pay

## 2019-09-02 ENCOUNTER — Other Ambulatory Visit (INDEPENDENT_AMBULATORY_CARE_PROVIDER_SITE_OTHER): Payer: Medicare Other

## 2019-09-02 DIAGNOSIS — I10 Essential (primary) hypertension: Secondary | ICD-10-CM | POA: Diagnosis not present

## 2019-09-02 DIAGNOSIS — E785 Hyperlipidemia, unspecified: Secondary | ICD-10-CM

## 2019-09-02 DIAGNOSIS — R739 Hyperglycemia, unspecified: Secondary | ICD-10-CM

## 2019-09-02 LAB — CBC WITH DIFFERENTIAL/PLATELET
Basophils Absolute: 0 10*3/uL (ref 0.0–0.1)
Basophils Relative: 0.5 % (ref 0.0–3.0)
Eosinophils Absolute: 0.2 10*3/uL (ref 0.0–0.7)
Eosinophils Relative: 3.1 % (ref 0.0–5.0)
HCT: 44.1 % (ref 39.0–52.0)
Hemoglobin: 15.2 g/dL (ref 13.0–17.0)
Lymphocytes Relative: 36.4 % (ref 12.0–46.0)
Lymphs Abs: 2.3 10*3/uL (ref 0.7–4.0)
MCHC: 34.4 g/dL (ref 30.0–36.0)
MCV: 99.4 fl (ref 78.0–100.0)
Monocytes Absolute: 0.5 10*3/uL (ref 0.1–1.0)
Monocytes Relative: 8.6 % (ref 3.0–12.0)
Neutro Abs: 3.2 10*3/uL (ref 1.4–7.7)
Neutrophils Relative %: 51.4 % (ref 43.0–77.0)
Platelets: 192 10*3/uL (ref 150.0–400.0)
RBC: 4.43 Mil/uL (ref 4.22–5.81)
RDW: 13.7 % (ref 11.5–15.5)
WBC: 6.2 10*3/uL (ref 4.0–10.5)

## 2019-09-02 LAB — LIPID PANEL
Cholesterol: 175 mg/dL (ref 0–200)
HDL: 48.2 mg/dL (ref >39.00)
LDL Cholesterol: 110 mg/dL — ABNORMAL HIGH (ref 0–99)
NonHDL: 126.49
Total CHOL/HDL Ratio: 4
Triglycerides: 82 mg/dL (ref 0.0–149.0)
VLDL: 16.4 mg/dL (ref 0.0–40.0)

## 2019-09-02 LAB — COMPREHENSIVE METABOLIC PANEL
ALT: 18 U/L (ref 0–53)
AST: 16 U/L (ref 0–37)
Albumin: 4 g/dL (ref 3.5–5.2)
Alkaline Phosphatase: 62 U/L (ref 39–117)
BUN: 18 mg/dL (ref 6–23)
CO2: 29 mEq/L (ref 19–32)
Calcium: 8.8 mg/dL (ref 8.4–10.5)
Chloride: 102 mEq/L (ref 96–112)
Creatinine, Ser: 0.83 mg/dL (ref 0.40–1.50)
GFR: 91.9 mL/min (ref >60.00)
Glucose, Bld: 109 mg/dL — ABNORMAL HIGH (ref 70–99)
Potassium: 4.2 mEq/L (ref 3.5–5.1)
Sodium: 138 mEq/L (ref 135–145)
Total Bilirubin: 0.5 mg/dL (ref 0.2–1.2)
Total Protein: 6.2 g/dL (ref 6.0–8.3)

## 2019-09-02 LAB — HEMOGLOBIN A1C: Hgb A1c MFr Bld: 5.5 % (ref 4.6–6.5)

## 2019-09-05 MED ORDER — ATORVASTATIN CALCIUM 20 MG PO TABS: 20.0000 mg | ORAL_TABLET | Freq: Every day | ORAL | 3 refills | Status: DC

## 2019-09-05 NOTE — Addendum Note (Signed)
Addended byDamita Dunnings D on: 09/05/2019 08:00 AM   Modules accepted: Orders

## 2019-09-06 ENCOUNTER — Telehealth: Payer: Self-pay

## 2019-09-06 NOTE — Telephone Encounter (Signed)
He previously took simvastatin- on allergy list- "mimicked" a heart attack- he doesn't want to take statins at all.

## 2019-09-06 NOTE — Telephone Encounter (Signed)
Okay, switch to Zetia 10 mg 1 tablet daily, send 1 year supply. We can check his blood work when he comes back in June as recommended.

## 2019-09-06 NOTE — Telephone Encounter (Signed)
Please clarify: Did he try atorvastatin or is he just concerned about side effects? Is he having chest pain or other heart symptoms? Let me know.  We could switch him to Pravachol or schedule a visit to discuss. May need a visit in person or virtual to discuss

## 2019-09-06 NOTE — Telephone Encounter (Signed)
Pt called and stated he received a call from the pharmacy regarding a script for atorvastatin.  Pt has some concerns taking this medication as he has had some symptoms that mimic heart attack on "statins."  He is wondering if there is another option for him.

## 2019-09-07 MED ORDER — BUPROPION HCL ER (XL) 300 MG PO TB24: 300.0000 mg | ORAL_TABLET | Freq: Every day | ORAL | 1 refills | Status: DC

## 2019-09-07 MED ORDER — METOPROLOL SUCCINATE ER 100 MG PO TB24: 100.0000 mg | ORAL_TABLET | Freq: Every day | ORAL | 1 refills | Status: DC

## 2019-09-07 MED ORDER — EZETIMIBE 10 MG PO TABS: 10.0000 mg | ORAL_TABLET | Freq: Every day | ORAL | 3 refills | Status: DC

## 2019-09-07 NOTE — Telephone Encounter (Signed)
LMOM informing Pt that Zetia has been sent to pharmacy. Instructed to call if questions/concerns.

## 2019-09-07 NOTE — Telephone Encounter (Signed)
Atorvastatin d/c from med list. Zetia sent to [facility]. Will inform Pt shortly.

## 2019-10-20 ENCOUNTER — Telehealth: Payer: Self-pay | Admitting: *Deleted

## 2019-10-20 ENCOUNTER — Telehealth: Payer: Self-pay | Admitting: Internal Medicine

## 2019-10-20 NOTE — Telephone Encounter (Signed)
Patient wanted blood type. Looks like he had it done in 2015.  It was O neg.  Patient notified.

## 2019-10-20 NOTE — Telephone Encounter (Signed)
Patient is requesting his blood type.  Please advise

## 2019-10-20 NOTE — Telephone Encounter (Signed)
Pt aware.

## 2019-10-20 NOTE — Telephone Encounter (Signed)
Component 5 yr ago  ABO/RH(D) O NEG   Antibody Screen NEG   Sample Expiration 03/06/2014   Resulting Agency [facility]

## 2019-12-21 ENCOUNTER — Other Ambulatory Visit: Payer: Self-pay | Admitting: Internal Medicine

## 2019-12-21 NOTE — Telephone Encounter (Signed)
Please advise on correct gabapentin dose.

## 2019-12-21 NOTE — Telephone Encounter (Signed)
What pharmacy ?

## 2019-12-21 NOTE — Telephone Encounter (Signed)
Per my notes from 12/31/2018, he self decrease gabapentin 800 mg to 1 tablet twice a day.  That is the correct dose.  Okay to refill 6 months.

## 2019-12-21 NOTE — Telephone Encounter (Signed)
Patient is calling regarding medications to all be filled for up to 90days except zolpidem (AMBIEN   Please advise

## 2019-12-21 NOTE — Telephone Encounter (Signed)
Called patient no option to leave vmail , only one number avail .

## 2019-12-22 NOTE — Telephone Encounter (Signed)
Rx sent 

## 2020-01-02 ENCOUNTER — Encounter: Payer: Self-pay | Admitting: Internal Medicine

## 2020-01-02 ENCOUNTER — Other Ambulatory Visit: Payer: Self-pay

## 2020-01-02 ENCOUNTER — Ambulatory Visit (INDEPENDENT_AMBULATORY_CARE_PROVIDER_SITE_OTHER): Payer: Medicare Other | Admitting: Internal Medicine

## 2020-01-02 VITALS — BP 123/75 | HR 53 | Temp 98.1°F | Resp 18 | Ht 70.0 in | Wt 299.4 lb

## 2020-01-02 DIAGNOSIS — G629 Polyneuropathy, unspecified: Secondary | ICD-10-CM | POA: Diagnosis not present

## 2020-01-02 DIAGNOSIS — G4733 Obstructive sleep apnea (adult) (pediatric): Secondary | ICD-10-CM

## 2020-01-02 DIAGNOSIS — E785 Hyperlipidemia, unspecified: Secondary | ICD-10-CM | POA: Diagnosis not present

## 2020-01-02 DIAGNOSIS — I10 Essential (primary) hypertension: Secondary | ICD-10-CM | POA: Diagnosis not present

## 2020-01-02 LAB — LIPID PANEL
Cholesterol: 142 mg/dL (ref 0–200)
HDL: 43.2 mg/dL (ref >39.00)
LDL Cholesterol: 80 mg/dL (ref 0–99)
NonHDL: 99.1
Total CHOL/HDL Ratio: 3
Triglycerides: 97 mg/dL (ref 0.0–149.0)
VLDL: 19.4 mg/dL (ref 0.0–40.0)

## 2020-01-02 LAB — BASIC METABOLIC PANEL
BUN: 10 mg/dL (ref 6–23)
CO2: 31 mEq/L (ref 19–32)
Calcium: 8.9 mg/dL (ref 8.4–10.5)
Chloride: 101 mEq/L (ref 96–112)
Creatinine, Ser: 0.82 mg/dL (ref 0.40–1.50)
GFR: 93.1 mL/min (ref >60.00)
Glucose, Bld: 126 mg/dL — ABNORMAL HIGH (ref 70–99)
Potassium: 4.3 mEq/L (ref 3.5–5.1)
Sodium: 139 mEq/L (ref 135–145)

## 2020-01-02 NOTE — Progress Notes (Signed)
Pre visit review using our clinic review tool, if applicable. No additional management support is needed unless otherwise documented below in the visit note. 

## 2020-01-02 NOTE — Patient Instructions (Addendum)
Please schedule Medicare Wellness with Glenard Haring.   Check the  blood pressure regulalrly BP GOAL is between 110/65 and  135/85. If it is consistently higher or lower, let me know     GO TO THE LAB : Get the blood work     [place], [place] Come back for 4 months

## 2020-01-02 NOTE — Progress Notes (Signed)
Subjective:    Patient ID: Robert Gibbs, male    DOB: 1951-06-01, 69 y.o.   MRN: [id number]  DOS:  01/02/2020 Type of visit - description: f/u We discussed several issues including high cholesterol, neuropathy, spinal stenosis and sleep apnea. In general feels well. He has lost weight, mostly due to portion control. Neuropathy symptoms are getting worse. BPH symptoms are also getting worse.  Wt Readings from Last 3 Encounters:  01/02/20 299 lb 6 oz (135.8 kg)  06/02/18 (!) 320 lb 2 oz (145.2 kg)  04/20/18 (!) 315 lb (142.9 kg)    Review of Systems See above   Past Medical History:  Diagnosis Date   Anxiety and depression    Arthritis    Barrett esophagus    BPH (benign prostatic hyperplasia)    CTS (carpal tunnel syndrome)    h/o   Edema of both legs    legs , ankles and feet   Erectile dysfunction    GERD (gastroesophageal reflux disease) 03/18/2012   H/O hiatal hernia    HTN (hypertension)    Hyperlipidemia    Microscopic hematuria    OSA (obstructive sleep apnea) 03/18/2012   on BiPap   Prostatitis    PVD (peripheral vascular disease) ([place])    Spinal stenosis 06-2012   saw neuro w/ "neuropathy", dx w/ spinal stenosis   Varicose veins    Vitamin D deficiency    Wears glasses     Past Surgical History:  Procedure Laterality Date   ARCH BAR REMOVAL     BACK SURGERY  2002   L4-L5   ELBOW SURGERY  1992   for tennis elbow-rt   ESOPHAGOGASTRODUODENOSCOPY  04/2015   w/ biopsy, short tongue of Barrett's without obvious nodularity, mass or esophagitis seen   EYE SURGERY Right    cataract with lens implant   FOOT SURGERY     R, for plantar fasciitis   HERNIA REPAIR     x 2 as a child-ing    KNEE ARTHROSCOPY WITH MENISCAL REPAIR Bilateral 05/04/2013   Procedure: BILATERAL KNEE ARTHROSCOPY WITH POSSIBLE CHONDROPLASTY AND MENISCECTOMY ;  Surgeon: Kerin Salen, MD;  Location: [facility];  Service: Orthopedics;   Laterality: Bilateral;  Right chrondroplasty, debridement, , partial medial menisectomy, removal of loose bodies.    LIGATION / DIVISION SAPHENOUS VEIN     both legs   PROSTATE BIOPSY     2010, (-)   TONSILLECTOMY     TOTAL KNEE ARTHROPLASTY Right 11/07/2013   Procedure: TOTAL KNEE ARTHROPLASTY;  Surgeon: Kerin Salen, MD;  Location: [facility];  Service: Orthopedics;  Laterality: Right;   TOTAL KNEE ARTHROPLASTY Left 03/01/2014   Procedure: TOTAL KNEE ARTHROPLASTY;  Surgeon: Kerin Salen, MD;  Location: [facility];  Service: Orthopedics;  Laterality: Left;   UPPER GASTROINTESTINAL ENDOSCOPY     UVULOPALATOPHARYNGOPLASTY (UPPP)/TONSILLECTOMY/SEPTOPLASTY  1990    Allergies as of 01/02/2020      Reactions   Codeine Itching   Nsaids    Acid Reflux   Simvastatin    Mimics heart attack   Tolmetin    Acid Reflux      Medication List       Accurate as of January 02, 2020 11:59 PM. If you have any questions, ask your nurse or doctor.        STOP taking these medications   cyclobenzaprine 10 MG tablet Commonly known as: FLEXERIL Stopped by: Robert November, MD     TAKE these  medications   albuterol 108 (90 Base) MCG/ACT inhaler Commonly known as: Ventolin HFA Inhale 2 puffs into the lungs every 6 (six) hours as needed for wheezing or shortness of breath.   aspirin 81 MG tablet Take 81 mg by mouth daily.   b complex vitamins capsule Take 1 capsule by mouth daily.   buPROPion 300 MG 24 hr tablet Commonly known as: WELLBUTRIN XL Take 1 tablet (300 mg total) by mouth daily.   Calcium 1200 1200-1000 MG-UNIT Chew Chew 1 tablet by mouth daily.   Coenzyme Q10 200 MG capsule Take 200 mg by mouth daily.   escitalopram 20 MG tablet Commonly known as: LEXAPRO TAKE 1 TABLET(20 MG) BY MOUTH DAILY   ezetimibe 10 MG tablet Commonly known as: Zetia Take 1 tablet (10 mg total) by mouth daily.   FLEX OMEGA BENEFITS/VIT D-3 PO Take 2 capsules by mouth daily.   gabapentin 800 MG  tablet Commonly known as: NEURONTIN Take 1 tablet (800 mg total) by mouth 2 (two) times daily.   lisinopril 40 MG tablet Commonly known as: ZESTRIL Take 1 tablet (40 mg total) by mouth daily.   magnesium oxide 400 MG tablet Commonly known as: MAG-OX Take 400 mg by mouth daily.   Melatonin 5 MG Caps Take 10 mg by mouth at bedtime. Patient stated that he is taking 10 mg daily   metoprolol succinate 100 MG 24 hr tablet Commonly known as: TOPROL-XL Take 1 tablet (100 mg total) by mouth daily. Take with or immediately following a meal.   multivitamin with minerals tablet Take 1 tablet by mouth daily.   Nasacort Allergy 24HR 55 MCG/ACT Aero nasal inhaler Generic drug: triamcinolone Place 2 sprays into the nose at bedtime.   omeprazole 40 MG capsule Commonly known as: PRILOSEC Take 1 capsule (40 mg total) by mouth daily.   vitamin C 1000 MG tablet Take 1,000 mg by mouth daily.   zolpidem 10 MG tablet Commonly known as: AMBIEN TAKE 1/2 TO 1 TABLET(5 TO 10 MG) BY MOUTH AT BEDTIME AS NEEDED FOR SLEEP          Objective:   Physical Exam BP 123/75 (BP Location: Left Arm, Patient Position: Sitting, Cuff Size: Normal)    Pulse (!) 53    Temp 98.1 F (36.7 C) (Oral)    Resp 18    Ht 5\' 10"  (1.778 m)    Wt 299 lb 6 oz (135.8 kg)    SpO2 98%    BMI 42.96 kg/m  General:   Well developed, NAD, BMI noted. HEENT:  Normocephalic . Face symmetric, atraumatic Lungs:  CTA B Normal respiratory effort, no intercostal retractions, no accessory muscle use. Heart: RRR,  no murmur.  Lower extremities: trace pretibial edema bilaterally  Skin: Not pale. Not jaundice Neurologic:  alert & oriented X3.  Speech normal, gait appropriate for age and unassisted Psych--  Cognition and judgment appear intact.  Cooperative with normal attention span and concentration.  Behavior appropriate. No anxious or depressed appearing.      Assessment     Assessment Hyperglycemia HTN Hyperlipidemia:  s/e  with simvastatin, declined other statins 08-2019 Chronic lower extremity edema w/ some pretibial redness  (ER 2-17, [place] neg DVT, BNP wnl) anxiety depression, insomnia COPD: Per CT 03-2016 Morbid obesity GI: GERD, Barrett's  esophagus OSA, BiPAP, per pulmonary GU: Dr Felipa Eth  --LUTS , failed a # of medications --BPH, elevated PSA:  (-)  biopsy 2009, PSA 05/2014 2.92 --ED --Hematuria, CT, cystoscopy 2009 negative  Vitamin D deficiency-  MSK: -- Spinal stenosis : saw neurology w/ sx of paresthesias feet-ankle, sx felt to be d/t spinal stenosis not neuropathy, see OV note 08-07-2012 , used to see pain med (Dr Vira Blanco) -High Risk UDS 2016 --CTS Diastasis recti  PLAN HTN: Continue with lisinopril, metoprolol.  Check a BMP Hyperglycemia: Last A1c very good, has lost 21 pounds since the last visit, praised! Morbid obesity: See above Hyperlipidemia: Based on last FLP was prescribed a statin, declined , reports he previously had problems with simvastatin.  Currently on Zetia, checking labs. Anxiety-depression-insomnia: Symptoms relatively well controlled, PHQ-9: 9 OSA: Reports feeling sleepy frequently, good compliance with BiPAP, Epworth scale is only 9; recommend to see pulmonary, settings adjustment?  New equipment?  Declined pulmonary evaluation. Strongly encouraged not to drive if sleepy. BPH: Ongoing symptoms, follow-up by urology Neuropathy: Symptoms are worsening, on gabapentin, we agreed on rerefer to neurology, last seen by them few years ago.  At the time they felt to be d/t spinal stenosis.  Patient has no major problems with back pain at this point RTC 4 months    This visit occurred during the SARS-CoV-2 public health emergency.  Safety protocols were in place, including screening questions prior to the visit, additional usage of staff PPE, and extensive cleaning of exam room while observing appropriate contact time as indicated for disinfecting solutions.

## 2020-01-03 NOTE — Assessment & Plan Note (Signed)
HTN: Continue with lisinopril, metoprolol.  Check a BMP Hyperglycemia: Last A1c very good, has lost 21 pounds since the last visit, praised! Morbid obesity: See above Hyperlipidemia: Based on last FLP was prescribed a statin, declined , reports he previously had problems with simvastatin.  Currently on Zetia, checking labs. Anxiety-depression-insomnia: Symptoms relatively well controlled, PHQ-9: 9 OSA: Reports feeling sleepy frequently, good compliance with BiPAP, Epworth scale is only 9; recommend to see pulmonary, settings adjustment?  New equipment?  Declined pulmonary evaluation. Strongly encouraged not to drive if sleepy. BPH: Ongoing symptoms, follow-up by urology Neuropathy: Symptoms are worsening, on gabapentin, we agreed on rerefer to neurology, last seen by them few years ago.  At the time they felt to be d/t spinal stenosis.  Patient has no major problems with back pain at this point RTC 4 months

## 2020-01-18 ENCOUNTER — Telehealth: Payer: Self-pay | Admitting: Internal Medicine

## 2020-01-18 NOTE — Telephone Encounter (Signed)
Per Dr. Larose Kells he has not tried to call him.

## 2020-01-18 NOTE — Telephone Encounter (Signed)
New Message:   Pt states he is returning a call for Dr. Larose Kells. Please advise.

## 2020-02-20 ENCOUNTER — Other Ambulatory Visit: Payer: Self-pay | Admitting: Internal Medicine

## 2020-02-20 NOTE — Telephone Encounter (Signed)
Ambien refill. [place] Pt.   Last OV: 01/02/2020 Last Fill: 03/21/2019 #30 and 2RF Pt sig: 1/2-1 tablet qhs prn UDS: None

## 2020-03-12 ENCOUNTER — Telehealth: Payer: Self-pay | Admitting: *Deleted

## 2020-03-12 ENCOUNTER — Ambulatory Visit: Payer: Medicare Other | Admitting: Diagnostic Neuroimaging

## 2020-03-12 ENCOUNTER — Encounter: Payer: Self-pay | Admitting: Diagnostic Neuroimaging

## 2020-03-12 NOTE — Telephone Encounter (Signed)
Patient was no show for new patient appointment today. 

## 2020-03-21 ENCOUNTER — Ambulatory Visit: Payer: Medicare Other

## 2020-03-28 ENCOUNTER — Ambulatory Visit: Payer: Medicare Other

## 2020-03-29 ENCOUNTER — Telehealth: Payer: Self-pay | Admitting: Internal Medicine

## 2020-03-29 NOTE — Telephone Encounter (Signed)
Caller Vaughan Basta  Call Back # [phone]  Patient states urinary tract infection symptoms, patient would like an appointment with provider. No appointment availability at this time per our schedule. Patient states symptoms for three days now. Patient urged to follow up with urgent care. Patient is requesting an order by placed for a urine sample.  Please Advise

## 2020-03-29 NOTE — Telephone Encounter (Signed)
Patient scheduled tomorrow with Dr Larose Kells @ 3:20

## 2020-03-29 NOTE — Telephone Encounter (Signed)
Okay to use the 3:20pm tomorrow.

## 2020-03-30 ENCOUNTER — Ambulatory Visit: Payer: Medicare Other | Admitting: Internal Medicine

## 2020-03-30 DIAGNOSIS — Z452 Encounter for adjustment and management of vascular access device: Secondary | ICD-10-CM | POA: Diagnosis not present

## 2020-03-30 DIAGNOSIS — I272 Pulmonary hypertension, unspecified: Secondary | ICD-10-CM | POA: Diagnosis not present

## 2020-03-30 DIAGNOSIS — G629 Polyneuropathy, unspecified: Secondary | ICD-10-CM | POA: Diagnosis present

## 2020-03-30 DIAGNOSIS — Z6841 Body Mass Index (BMI) 40.0 and over, adult: Secondary | ICD-10-CM | POA: Diagnosis not present

## 2020-03-30 DIAGNOSIS — M11231 Other chondrocalcinosis, right wrist: Secondary | ICD-10-CM | POA: Diagnosis not present

## 2020-03-30 DIAGNOSIS — I808 Phlebitis and thrombophlebitis of other sites: Secondary | ICD-10-CM | POA: Diagnosis not present

## 2020-03-30 DIAGNOSIS — G929 Unspecified toxic encephalopathy: Secondary | ICD-10-CM | POA: Diagnosis not present

## 2020-03-30 DIAGNOSIS — I739 Peripheral vascular disease, unspecified: Secondary | ICD-10-CM | POA: Diagnosis present

## 2020-03-30 DIAGNOSIS — L03116 Cellulitis of left lower limb: Secondary | ICD-10-CM | POA: Diagnosis present

## 2020-03-30 DIAGNOSIS — R350 Frequency of micturition: Secondary | ICD-10-CM | POA: Diagnosis not present

## 2020-03-30 DIAGNOSIS — E669 Obesity, unspecified: Secondary | ICD-10-CM | POA: Diagnosis present

## 2020-03-30 DIAGNOSIS — Z79899 Other long term (current) drug therapy: Secondary | ICD-10-CM | POA: Diagnosis not present

## 2020-03-30 DIAGNOSIS — B9620 Unspecified Escherichia coli [E. coli] as the cause of diseases classified elsewhere: Secondary | ICD-10-CM | POA: Diagnosis not present

## 2020-03-30 DIAGNOSIS — Z87440 Personal history of urinary (tract) infections: Secondary | ICD-10-CM | POA: Diagnosis not present

## 2020-03-30 DIAGNOSIS — A419 Sepsis, unspecified organism: Secondary | ICD-10-CM | POA: Diagnosis not present

## 2020-03-30 DIAGNOSIS — I10 Essential (primary) hypertension: Secondary | ICD-10-CM | POA: Diagnosis present

## 2020-03-30 DIAGNOSIS — N138 Other obstructive and reflux uropathy: Secondary | ICD-10-CM | POA: Diagnosis present

## 2020-03-30 DIAGNOSIS — L03114 Cellulitis of left upper limb: Secondary | ICD-10-CM | POA: Diagnosis present

## 2020-03-30 DIAGNOSIS — L0390 Cellulitis, unspecified: Secondary | ICD-10-CM | POA: Diagnosis not present

## 2020-03-30 DIAGNOSIS — N401 Enlarged prostate with lower urinary tract symptoms: Secondary | ICD-10-CM | POA: Diagnosis present

## 2020-03-30 DIAGNOSIS — R509 Fever, unspecified: Secondary | ICD-10-CM | POA: Diagnosis not present

## 2020-03-30 DIAGNOSIS — R404 Transient alteration of awareness: Secondary | ICD-10-CM | POA: Diagnosis not present

## 2020-03-30 DIAGNOSIS — N419 Inflammatory disease of prostate, unspecified: Secondary | ICD-10-CM | POA: Diagnosis not present

## 2020-03-30 DIAGNOSIS — Z7982 Long term (current) use of aspirin: Secondary | ICD-10-CM | POA: Diagnosis not present

## 2020-03-30 DIAGNOSIS — M19041 Primary osteoarthritis, right hand: Secondary | ICD-10-CM | POA: Diagnosis not present

## 2020-03-30 DIAGNOSIS — R3915 Urgency of urination: Secondary | ICD-10-CM | POA: Diagnosis not present

## 2020-03-30 DIAGNOSIS — E785 Hyperlipidemia, unspecified: Secondary | ICD-10-CM | POA: Diagnosis present

## 2020-03-30 DIAGNOSIS — E876 Hypokalemia: Secondary | ICD-10-CM | POA: Diagnosis not present

## 2020-03-30 DIAGNOSIS — T801XXA Vascular complications following infusion, transfusion and therapeutic injection, initial encounter: Secondary | ICD-10-CM | POA: Diagnosis not present

## 2020-03-30 DIAGNOSIS — G928 Other toxic encephalopathy: Secondary | ICD-10-CM | POA: Diagnosis present

## 2020-03-30 DIAGNOSIS — R0689 Other abnormalities of breathing: Secondary | ICD-10-CM | POA: Diagnosis not present

## 2020-03-30 DIAGNOSIS — G4733 Obstructive sleep apnea (adult) (pediatric): Secondary | ICD-10-CM | POA: Diagnosis present

## 2020-03-30 DIAGNOSIS — N390 Urinary tract infection, site not specified: Secondary | ICD-10-CM | POA: Diagnosis present

## 2020-03-30 DIAGNOSIS — L03113 Cellulitis of right upper limb: Secondary | ICD-10-CM | POA: Diagnosis present

## 2020-03-30 DIAGNOSIS — R6520 Severe sepsis without septic shock: Secondary | ICD-10-CM | POA: Diagnosis present

## 2020-03-30 DIAGNOSIS — B9689 Other specified bacterial agents as the cause of diseases classified elsewhere: Secondary | ICD-10-CM | POA: Diagnosis not present

## 2020-03-30 DIAGNOSIS — Z87891 Personal history of nicotine dependence: Secondary | ICD-10-CM | POA: Diagnosis not present

## 2020-03-30 DIAGNOSIS — N410 Acute prostatitis: Secondary | ICD-10-CM | POA: Diagnosis present

## 2020-03-30 DIAGNOSIS — R410 Disorientation, unspecified: Secondary | ICD-10-CM | POA: Diagnosis not present

## 2020-03-30 DIAGNOSIS — Z96653 Presence of artificial knee joint, bilateral: Secondary | ICD-10-CM | POA: Diagnosis present

## 2020-03-30 DIAGNOSIS — A400 Sepsis due to streptococcus, group A: Secondary | ICD-10-CM | POA: Diagnosis present

## 2020-03-31 DIAGNOSIS — N390 Urinary tract infection, site not specified: Secondary | ICD-10-CM | POA: Diagnosis not present

## 2020-03-31 DIAGNOSIS — M19041 Primary osteoarthritis, right hand: Secondary | ICD-10-CM | POA: Diagnosis not present

## 2020-03-31 DIAGNOSIS — L03113 Cellulitis of right upper limb: Secondary | ICD-10-CM | POA: Diagnosis not present

## 2020-03-31 DIAGNOSIS — L03116 Cellulitis of left lower limb: Secondary | ICD-10-CM | POA: Diagnosis not present

## 2020-03-31 DIAGNOSIS — R6520 Severe sepsis without septic shock: Secondary | ICD-10-CM | POA: Diagnosis not present

## 2020-03-31 DIAGNOSIS — G929 Unspecified toxic encephalopathy: Secondary | ICD-10-CM | POA: Diagnosis not present

## 2020-03-31 DIAGNOSIS — M11231 Other chondrocalcinosis, right wrist: Secondary | ICD-10-CM | POA: Diagnosis not present

## 2020-03-31 DIAGNOSIS — A419 Sepsis, unspecified organism: Secondary | ICD-10-CM | POA: Diagnosis not present

## 2020-04-01 DIAGNOSIS — L03113 Cellulitis of right upper limb: Secondary | ICD-10-CM | POA: Diagnosis not present

## 2020-04-01 DIAGNOSIS — G929 Unspecified toxic encephalopathy: Secondary | ICD-10-CM | POA: Diagnosis not present

## 2020-04-01 DIAGNOSIS — N390 Urinary tract infection, site not specified: Secondary | ICD-10-CM | POA: Diagnosis not present

## 2020-04-01 DIAGNOSIS — L03116 Cellulitis of left lower limb: Secondary | ICD-10-CM | POA: Diagnosis not present

## 2020-04-02 DIAGNOSIS — L03113 Cellulitis of right upper limb: Secondary | ICD-10-CM | POA: Diagnosis not present

## 2020-04-02 DIAGNOSIS — L03116 Cellulitis of left lower limb: Secondary | ICD-10-CM | POA: Diagnosis not present

## 2020-04-02 DIAGNOSIS — A400 Sepsis due to streptococcus, group A: Secondary | ICD-10-CM | POA: Diagnosis not present

## 2020-04-02 DIAGNOSIS — N390 Urinary tract infection, site not specified: Secondary | ICD-10-CM | POA: Diagnosis not present

## 2020-04-02 DIAGNOSIS — I808 Phlebitis and thrombophlebitis of other sites: Secondary | ICD-10-CM | POA: Diagnosis not present

## 2020-04-02 DIAGNOSIS — B9620 Unspecified Escherichia coli [E. coli] as the cause of diseases classified elsewhere: Secondary | ICD-10-CM | POA: Diagnosis not present

## 2020-04-03 DIAGNOSIS — I808 Phlebitis and thrombophlebitis of other sites: Secondary | ICD-10-CM | POA: Diagnosis not present

## 2020-04-03 DIAGNOSIS — I272 Pulmonary hypertension, unspecified: Secondary | ICD-10-CM | POA: Diagnosis not present

## 2020-04-05 ENCOUNTER — Telehealth: Payer: Self-pay | Admitting: Internal Medicine

## 2020-04-05 DIAGNOSIS — M4800 Spinal stenosis, site unspecified: Secondary | ICD-10-CM | POA: Diagnosis not present

## 2020-04-05 DIAGNOSIS — M1990 Unspecified osteoarthritis, unspecified site: Secondary | ICD-10-CM | POA: Diagnosis not present

## 2020-04-05 DIAGNOSIS — I272 Pulmonary hypertension, unspecified: Secondary | ICD-10-CM | POA: Diagnosis not present

## 2020-04-05 DIAGNOSIS — N138 Other obstructive and reflux uropathy: Secondary | ICD-10-CM | POA: Diagnosis not present

## 2020-04-05 DIAGNOSIS — N410 Acute prostatitis: Secondary | ICD-10-CM | POA: Diagnosis not present

## 2020-04-05 DIAGNOSIS — E876 Hypokalemia: Secondary | ICD-10-CM | POA: Diagnosis not present

## 2020-04-05 DIAGNOSIS — I10 Essential (primary) hypertension: Secondary | ICD-10-CM | POA: Diagnosis not present

## 2020-04-05 DIAGNOSIS — I081 Rheumatic disorders of both mitral and tricuspid valves: Secondary | ICD-10-CM | POA: Diagnosis not present

## 2020-04-05 DIAGNOSIS — A400 Sepsis due to streptococcus, group A: Secondary | ICD-10-CM | POA: Diagnosis not present

## 2020-04-05 DIAGNOSIS — L03115 Cellulitis of right lower limb: Secondary | ICD-10-CM | POA: Diagnosis not present

## 2020-04-05 DIAGNOSIS — L03113 Cellulitis of right upper limb: Secondary | ICD-10-CM | POA: Diagnosis not present

## 2020-04-05 DIAGNOSIS — E559 Vitamin D deficiency, unspecified: Secondary | ICD-10-CM | POA: Diagnosis not present

## 2020-04-05 DIAGNOSIS — I739 Peripheral vascular disease, unspecified: Secondary | ICD-10-CM | POA: Diagnosis not present

## 2020-04-05 DIAGNOSIS — L03116 Cellulitis of left lower limb: Secondary | ICD-10-CM | POA: Diagnosis not present

## 2020-04-05 DIAGNOSIS — Z452 Encounter for adjustment and management of vascular access device: Secondary | ICD-10-CM | POA: Diagnosis not present

## 2020-04-05 DIAGNOSIS — N39498 Other specified urinary incontinence: Secondary | ICD-10-CM | POA: Diagnosis not present

## 2020-04-05 DIAGNOSIS — N401 Enlarged prostate with lower urinary tract symptoms: Secondary | ICD-10-CM | POA: Diagnosis not present

## 2020-04-05 DIAGNOSIS — N390 Urinary tract infection, site not specified: Secondary | ICD-10-CM | POA: Diagnosis not present

## 2020-04-05 DIAGNOSIS — F32A Depression, unspecified: Secondary | ICD-10-CM | POA: Diagnosis not present

## 2020-04-05 DIAGNOSIS — R350 Frequency of micturition: Secondary | ICD-10-CM | POA: Diagnosis not present

## 2020-04-05 DIAGNOSIS — I808 Phlebitis and thrombophlebitis of other sites: Secondary | ICD-10-CM | POA: Diagnosis not present

## 2020-04-05 DIAGNOSIS — I700 Atherosclerosis of aorta: Secondary | ICD-10-CM | POA: Diagnosis not present

## 2020-04-05 DIAGNOSIS — G4700 Insomnia, unspecified: Secondary | ICD-10-CM | POA: Diagnosis not present

## 2020-04-05 DIAGNOSIS — G4733 Obstructive sleep apnea (adult) (pediatric): Secondary | ICD-10-CM | POA: Diagnosis not present

## 2020-04-05 DIAGNOSIS — F411 Generalized anxiety disorder: Secondary | ICD-10-CM | POA: Diagnosis not present

## 2020-04-05 NOTE — Telephone Encounter (Signed)
Please advise 

## 2020-04-05 NOTE — Telephone Encounter (Signed)
Caller name: Diane ([facility] nurse) Call back number:[phone]  Need verbal order for skill nurse to take care of IV antibiotic and PICC line.

## 2020-04-05 NOTE — Telephone Encounter (Addendum)
Recently admitted to outside hospital with left lower extremity and left upper extremity cellulitis with bacteremia.  Received IV Vanco and Zosyn, blood cultures grew out a strep. PICC line was placed and was recommended to complete 2 weeks of ceftriaxone.  End date 04/14/2020. Remove PICC line after IV antibiotics.  Proceed with the   order for the nurse to care for the line, complete the antibiotics and remove PICC line after last dose on 04/14/2020.  Also needs hospital follow-up.

## 2020-04-06 NOTE — Telephone Encounter (Signed)
LMOM w/ Diane w/ verbal orders.   Kim- can you set Pt up w/ hosp f/u please?

## 2020-04-09 ENCOUNTER — Telehealth: Payer: Self-pay

## 2020-04-09 DIAGNOSIS — L0390 Cellulitis, unspecified: Secondary | ICD-10-CM | POA: Diagnosis not present

## 2020-04-09 DIAGNOSIS — L03115 Cellulitis of right lower limb: Secondary | ICD-10-CM | POA: Diagnosis not present

## 2020-04-09 DIAGNOSIS — N390 Urinary tract infection, site not specified: Secondary | ICD-10-CM | POA: Diagnosis not present

## 2020-04-09 DIAGNOSIS — Z452 Encounter for adjustment and management of vascular access device: Secondary | ICD-10-CM | POA: Diagnosis not present

## 2020-04-09 DIAGNOSIS — A400 Sepsis due to streptococcus, group A: Secondary | ICD-10-CM | POA: Diagnosis not present

## 2020-04-09 DIAGNOSIS — L03113 Cellulitis of right upper limb: Secondary | ICD-10-CM | POA: Diagnosis not present

## 2020-04-09 DIAGNOSIS — L03116 Cellulitis of left lower limb: Secondary | ICD-10-CM | POA: Diagnosis not present

## 2020-04-09 NOTE — Telephone Encounter (Signed)
Transition Care Management Follow-up Telephone Call  Date of discharge and from where: [facility]-04/04/20  How have you been since you were released from the hospital? Ok-a little lightheaded   Any questions or concerns? No  Items Reviewed:  Did the pt receive and understand the discharge instructions provided? Yes   Medications obtained and verified? Yes   Other? Yes   Any new allergies since your discharge? No   Dietary orders reviewed? Yes  Do you have support at home? Yes   Home Care and Equipment/Supplies: Were home health services ordered? yes If so, what is the name of the agency? [facility] Has the agency set up a time to come to the patient's home? yes Were any new equipment or medical supplies ordered?  No What is the name of the medical supply agency? N/A Were you able to get the supplies/equipment? not applicable Do you have any questions related to the use of the equipment or supplies? No  Functional Questionnaire: (I = Independent and D = Dependent) ADLs: I  Bathing/Dressing- I  Meal Prep- I-with assistance  Eating- I  Maintaining continence- I  Transferring/Ambulation- I  Managing Meds- I  Follow up appointments reviewed:   PCP Hospital f/u appt confirmed? Yes  Scheduled to see Dr. Larose Kells on 04/18/20 @ 3:40pm.  [facility] f/u appt confirmed? No  Patient to schedule  Are transportation arrangements needed? No   If their condition worsens, is the pt aware to call PCP or go to the Emergency Dept.? Yes  Was the patient provided with contact information for the PCP's office or ED? Yes  Was to pt encouraged to call back with questions or concerns? Yes

## 2020-04-10 DIAGNOSIS — N390 Urinary tract infection, site not specified: Secondary | ICD-10-CM | POA: Diagnosis not present

## 2020-04-10 DIAGNOSIS — L03115 Cellulitis of right lower limb: Secondary | ICD-10-CM | POA: Diagnosis not present

## 2020-04-10 DIAGNOSIS — A400 Sepsis due to streptococcus, group A: Secondary | ICD-10-CM | POA: Diagnosis not present

## 2020-04-10 DIAGNOSIS — L03113 Cellulitis of right upper limb: Secondary | ICD-10-CM | POA: Diagnosis not present

## 2020-04-10 DIAGNOSIS — Z452 Encounter for adjustment and management of vascular access device: Secondary | ICD-10-CM | POA: Diagnosis not present

## 2020-04-10 DIAGNOSIS — L03116 Cellulitis of left lower limb: Secondary | ICD-10-CM | POA: Diagnosis not present

## 2020-04-12 DIAGNOSIS — L03116 Cellulitis of left lower limb: Secondary | ICD-10-CM | POA: Diagnosis not present

## 2020-04-12 DIAGNOSIS — A400 Sepsis due to streptococcus, group A: Secondary | ICD-10-CM | POA: Diagnosis not present

## 2020-04-12 DIAGNOSIS — Z452 Encounter for adjustment and management of vascular access device: Secondary | ICD-10-CM | POA: Diagnosis not present

## 2020-04-12 DIAGNOSIS — N390 Urinary tract infection, site not specified: Secondary | ICD-10-CM | POA: Diagnosis not present

## 2020-04-12 DIAGNOSIS — L03115 Cellulitis of right lower limb: Secondary | ICD-10-CM | POA: Diagnosis not present

## 2020-04-12 DIAGNOSIS — L03113 Cellulitis of right upper limb: Secondary | ICD-10-CM | POA: Diagnosis not present

## 2020-04-16 DIAGNOSIS — Z452 Encounter for adjustment and management of vascular access device: Secondary | ICD-10-CM | POA: Diagnosis not present

## 2020-04-16 DIAGNOSIS — L0390 Cellulitis, unspecified: Secondary | ICD-10-CM | POA: Diagnosis not present

## 2020-04-16 DIAGNOSIS — L03116 Cellulitis of left lower limb: Secondary | ICD-10-CM | POA: Diagnosis not present

## 2020-04-16 DIAGNOSIS — N390 Urinary tract infection, site not specified: Secondary | ICD-10-CM | POA: Diagnosis not present

## 2020-04-16 DIAGNOSIS — A400 Sepsis due to streptococcus, group A: Secondary | ICD-10-CM | POA: Diagnosis not present

## 2020-04-16 DIAGNOSIS — L03115 Cellulitis of right lower limb: Secondary | ICD-10-CM | POA: Diagnosis not present

## 2020-04-16 DIAGNOSIS — L03113 Cellulitis of right upper limb: Secondary | ICD-10-CM | POA: Diagnosis not present

## 2020-04-17 DIAGNOSIS — L03116 Cellulitis of left lower limb: Secondary | ICD-10-CM | POA: Diagnosis not present

## 2020-04-17 DIAGNOSIS — Z452 Encounter for adjustment and management of vascular access device: Secondary | ICD-10-CM | POA: Diagnosis not present

## 2020-04-17 DIAGNOSIS — N390 Urinary tract infection, site not specified: Secondary | ICD-10-CM | POA: Diagnosis not present

## 2020-04-17 DIAGNOSIS — A400 Sepsis due to streptococcus, group A: Secondary | ICD-10-CM | POA: Diagnosis not present

## 2020-04-17 DIAGNOSIS — L03113 Cellulitis of right upper limb: Secondary | ICD-10-CM | POA: Diagnosis not present

## 2020-04-17 DIAGNOSIS — L03115 Cellulitis of right lower limb: Secondary | ICD-10-CM | POA: Diagnosis not present

## 2020-04-18 ENCOUNTER — Encounter: Payer: Self-pay | Admitting: Internal Medicine

## 2020-04-18 ENCOUNTER — Ambulatory Visit (INDEPENDENT_AMBULATORY_CARE_PROVIDER_SITE_OTHER): Payer: Medicare Other | Admitting: Internal Medicine

## 2020-04-18 ENCOUNTER — Other Ambulatory Visit: Payer: Self-pay

## 2020-04-18 VITALS — BP 146/91 | HR 60 | Temp 97.5°F | Resp 18 | Ht 70.0 in | Wt 287.5 lb

## 2020-04-18 DIAGNOSIS — L03119 Cellulitis of unspecified part of limb: Secondary | ICD-10-CM | POA: Diagnosis not present

## 2020-04-18 DIAGNOSIS — A419 Sepsis, unspecified organism: Secondary | ICD-10-CM | POA: Diagnosis not present

## 2020-04-18 DIAGNOSIS — N419 Inflammatory disease of prostate, unspecified: Secondary | ICD-10-CM

## 2020-04-18 NOTE — Patient Instructions (Addendum)
Check the  blood pressure 2 or 3 times a week BP GOAL is between 110/65 and  135/85. If it is consistently higher or lower, let me know   GO TO THE LAB : Get the blood work     GO TO THE FRONT DESK, PLEASE SCHEDULE YOUR APPOINTMENTS Come back for a checkup in 3 months 

## 2020-04-18 NOTE — Progress Notes (Signed)
Pre visit review using our clinic review tool, if applicable. No additional management support is needed unless otherwise documented below in the visit note. 

## 2020-04-18 NOTE — Progress Notes (Addendum)
Subjective:    Patient ID: Robert Gibbs, male    DOB: May 21, 1951, 69 y.o.   MRN: [id number]  DOS:  04/18/2020 Type of visit - description: Hospital follow-up  Was admitted to [facility] and discharged 04/04/2020 after  5 days: She presented to the ER with MS changes, urinary incontinence, erythema at the lower extremity and a septic picture, was diagnosed with: Cellulitis left >> L leg distal, right arm. Prostatitis Developed thrombophlebitis of the left arm while in the hospital. Mental status changes upon admission felt to be due to sepsis, MS changes resolved with treatment.  Discharged with IV ceftriaxone through a PICC line   Review of Systems Since he left the hospital, he is home, finished IV antibiotics through the PICC line and it was removed yesterday  Denies fever chills No nausea, vomiting, blood in the stools. He still have some urinary frequency, better than when he was admitted to the hospital but not back to baseline. Redness of the extremities decreased.   Past Medical History:  Diagnosis Date  . Anxiety and depression   . Arthritis   . Barrett esophagus   . BPH (benign prostatic hyperplasia)   . CTS (carpal tunnel syndrome)    h/o  . Edema of both legs    legs , ankles and feet  . Erectile dysfunction   . GERD (gastroesophageal reflux disease) 03/18/2012  . H/O hiatal hernia   . HTN (hypertension)   . Hyperlipidemia   . Microscopic hematuria   . OSA (obstructive sleep apnea) 03/18/2012   on BiPap  . Prostatitis   . PVD (peripheral vascular disease) ([place])   . Spinal stenosis 06-2012   saw neuro w/ "neuropathy", dx w/ spinal stenosis  . Varicose veins   . Vitamin D deficiency   . Wears glasses     Past Surgical History:  Procedure Laterality Date  . ARCH BAR REMOVAL    . BACK SURGERY  2002   L4-L5  . [place]   for tennis elbow-rt  . ESOPHAGOGASTRODUODENOSCOPY  04/2015   w/ biopsy, short tongue of Barrett's without  obvious nodularity, mass or esophagitis seen  . EYE SURGERY Right    cataract with lens implant  . FOOT SURGERY     R, for plantar fasciitis  . HERNIA REPAIR     x 2 as a child-ing   . KNEE ARTHROSCOPY WITH MENISCAL REPAIR Bilateral 05/04/2013   Procedure: BILATERAL KNEE ARTHROSCOPY WITH POSSIBLE CHONDROPLASTY AND MENISCECTOMY ;  Surgeon: Kerin Salen, MD;  Location: [facility];  Service: Orthopedics;  Laterality: Bilateral;  Right chrondroplasty, debridement, , partial medial menisectomy, removal of loose bodies.   [place] LIGATION / DIVISION SAPHENOUS VEIN     both legs  . PROSTATE BIOPSY     2010, (-)  . TONSILLECTOMY    . TOTAL KNEE ARTHROPLASTY Right 11/07/2013   Procedure: TOTAL KNEE ARTHROPLASTY;  Surgeon: Kerin Salen, MD;  Location: [facility];  Service: Orthopedics;  Laterality: Right;  . TOTAL KNEE ARTHROPLASTY Left 03/01/2014   Procedure: TOTAL KNEE ARTHROPLASTY;  Surgeon: Kerin Salen, MD;  Location: [facility];  Service: Orthopedics;  Laterality: Left;  . UPPER GASTROINTESTINAL ENDOSCOPY    . UVULOPALATOPHARYNGOPLASTY (UPPP)/TONSILLECTOMY/SEPTOPLASTY  1990    Allergies as of 04/18/2020      Reactions   Codeine Itching   Nsaids    Acid Reflux   Simvastatin    Mimics heart attack   Tolmetin  Acid Reflux      Medication List       Accurate as of April 18, 2020 11:59 PM. If you have any questions, ask your nurse or doctor.        STOP taking these medications   cefTRIAXone 1 g in dextrose 5 % 50 mL Stopped by: Kathlene November, MD     TAKE these medications   albuterol 108 (90 Base) MCG/ACT inhaler Commonly known as: Ventolin HFA Inhale 2 puffs into the lungs every 6 (six) hours as needed for wheezing or shortness of breath.   aspirin 81 MG tablet Take 81 mg by mouth daily.   b complex vitamins capsule Take 1 capsule by mouth daily.   buPROPion 300 MG 24 hr tablet Commonly known as: WELLBUTRIN XL Take 1 tablet (300 mg total) by mouth daily.   Calcium  1200 1200-1000 MG-UNIT Chew Chew 1 tablet by mouth daily.   Coenzyme Q10 200 MG capsule Take 200 mg by mouth daily.   escitalopram 20 MG tablet Commonly known as: LEXAPRO TAKE 1 TABLET(20 MG) BY MOUTH DAILY   ezetimibe 10 MG tablet Commonly known as: Zetia Take 1 tablet (10 mg total) by mouth daily.   FLEX OMEGA BENEFITS/VIT D-3 PO Take 2 capsules by mouth daily.   gabapentin 800 MG tablet Commonly known as: NEURONTIN Take 1 tablet (800 mg total) by mouth 2 (two) times daily.   lisinopril 40 MG tablet Commonly known as: ZESTRIL Take 1 tablet (40 mg total) by mouth daily.   magnesium oxide 400 MG tablet Commonly known as: MAG-OX Take 400 mg by mouth daily.   Melatonin 5 MG Caps Take 10 mg by mouth at bedtime. Patient stated that he is taking 10 mg daily   metoprolol succinate 100 MG 24 hr tablet Commonly known as: TOPROL-XL Take 1 tablet (100 mg total) by mouth daily. Take with or immediately following a meal.   multivitamin with minerals tablet Take 1 tablet by mouth daily.   Nasacort Allergy 24HR 55 MCG/ACT Aero nasal inhaler Generic drug: triamcinolone Place 2 sprays into the nose at bedtime.   omeprazole 40 MG capsule Commonly known as: PRILOSEC Take 1 capsule (40 mg total) by mouth daily.   vitamin C 1000 MG tablet Take 1,000 mg by mouth daily.   zolpidem 10 MG tablet Commonly known as: AMBIEN TAKE 1/2 TO 1 TABLET(5 TO 10 MG) BY MOUTH AT BEDTIME AS NEEDED FOR SLEEP          Objective:   Physical Exam BP (!) 146/91 (BP Location: Left Arm, Patient Position: Sitting, Cuff Size: Normal)   Pulse 60   Temp (!) 97.5 F (36.4 C) (Oral)   Resp 18   Ht 5\' 10"  (1.778 m)   Wt 287 lb 8 oz (130.4 kg)   SpO2 100%   BMI 41.25 kg/m  General:   Well developed, NAD, BMI noted.  HEENT:  Normocephalic . Face symmetric, atraumatic Lungs:  CTA B Normal respiratory effort, no intercostal retractions, no accessory muscle use. Heart: RRR,  no murmur.  Abdomen:   Not distended, soft, non-tender. No rebound or rigidity.   Skin: See picture, pretibial skin is slightly warm and reddish more so on the left.. Cubital area of the left arm where he had thrombophlebitis is normal other than some ecchymosis Lower extremities: Trace pitting edema pretibial areas, calves symmetric and soft Neurologic:  alert & oriented X3.  Speech normal, gait appropriate for age and unassisted Psych--  Cognition and  judgment appear intact.  Cooperative with normal attention span and concentration.  Behavior appropriate. No anxious or depressed appearing.       Assessment     Assessment Hyperglycemia HTN Hyperlipidemia: s/e  with simvastatin, declined other statins 08-2019 Chronic lower extremity edema w/ some pretibial redness  (ER 2-17, [place] neg DVT, BNP wnl) anxiety depression, insomnia COPD: Per CT 03-2016 Morbid obesity GI: GERD, Barrett's  esophagus OSA, BiPAP, per pulmonary GU: Dr Felipa Eth  --LUTS , failed a # of medications --BPH, elevated PSA:  (-)  biopsy 2009, PSA 05/2014 2.92 --ED --Hematuria, CT, cystoscopy 2009 negative --Prostatitis, admitted 03-2020 Vitamin D deficiency-  MSK: -- Spinal stenosis : saw neurology w/ sx of paresthesias feet-ankle, sx felt to be d/t spinal stenosis not neuropathy, see OV note 08-07-2012 , used to see pain med (Dr Vira Blanco) -High Risk UDS 2016 --CTS Diastasis recti  [facility] follow-up,  last labs from recent admission: Potassium 3.3, creatinine 0.7, white count 8.5, hemoglobin 14, platelets 227 Blood culture 03/30/2020: Streptococcus beta hemolyticus group A Urinalysis: RBCs present, + WBCs, eventually a urine culture negative.  I do not see a PSA Sepsis, cellulitis lower extremities, thrombophlebitis left arm, prostatitis: Overall seems to be improving, status post IV antibiotics at the hospital and at home. Afebrile. Clinically area of cellulitis is better. LUTS: Decreasing. Plan: CMP, CBC, keep  follow-up with urology. If fever, chills, increase prostate symptoms let me know HTN: Slightly elevated today, recommend ambulatory BPs, continue lisinopril, metoprolol.  See AVS. Preventive care: Recommend to wait 1 or 2 weeks before he proceed with his influenza shot and Covid vaccine booster RTC 3 months  Time spent with the patient 42 minutes, mostly due to extensive chart review.  This visit occurred during the SARS-CoV-2 public health emergency.  Safety protocols were in place, including screening questions prior to the visit, additional usage of staff PPE, and extensive cleaning of exam room while observing appropriate contact time as indicated for disinfecting solutions.

## 2020-04-19 NOTE — Assessment & Plan Note (Signed)
Hospital follow-up,  last labs from recent admission: Potassium 3.3, creatinine 0.7, white count 8.5, hemoglobin 14, platelets 227 Blood culture 03/30/2020: Streptococcus beta hemolyticus group A Urinalysis: RBCs present, + WBCs, eventually a urine culture negative.  I do not see a PSA Sepsis, cellulitis lower extremities, thrombophlebitis left arm, prostatitis: Overall seems to be improving, status post IV antibiotics at the hospital and at home. Afebrile. Clinically area of cellulitis is better. LUTS: Decreasing. Plan: CMP, CBC, keep follow-up with urology. If fever, chills, increase prostate symptoms let me know HTN: Slightly elevated today, recommend ambulatory BPs, continue lisinopril, metoprolol.  See AVS. Preventive care: Recommend to wait 1 or 2 weeks before he proceed with his influenza shot and Covid vaccine booster RTC 3 months

## 2020-04-19 NOTE — Addendum Note (Signed)
Addended by: Kathlene November E on: 04/19/2020 04:36 PM   Modules accepted: Level of Service

## 2020-04-20 ENCOUNTER — Telehealth: Payer: Self-pay

## 2020-04-20 DIAGNOSIS — F411 Generalized anxiety disorder: Secondary | ICD-10-CM

## 2020-04-20 DIAGNOSIS — M4800 Spinal stenosis, site unspecified: Secondary | ICD-10-CM

## 2020-04-20 DIAGNOSIS — N390 Urinary tract infection, site not specified: Secondary | ICD-10-CM | POA: Diagnosis not present

## 2020-04-20 DIAGNOSIS — L03115 Cellulitis of right lower limb: Secondary | ICD-10-CM | POA: Diagnosis not present

## 2020-04-20 DIAGNOSIS — Z9181 History of falling: Secondary | ICD-10-CM

## 2020-04-20 DIAGNOSIS — F32A Depression, unspecified: Secondary | ICD-10-CM | POA: Diagnosis not present

## 2020-04-20 DIAGNOSIS — N138 Other obstructive and reflux uropathy: Secondary | ICD-10-CM

## 2020-04-20 DIAGNOSIS — K219 Gastro-esophageal reflux disease without esophagitis: Secondary | ICD-10-CM

## 2020-04-20 DIAGNOSIS — G4733 Obstructive sleep apnea (adult) (pediatric): Secondary | ICD-10-CM

## 2020-04-20 DIAGNOSIS — A400 Sepsis due to streptococcus, group A: Secondary | ICD-10-CM | POA: Diagnosis not present

## 2020-04-20 DIAGNOSIS — Z792 Long term (current) use of antibiotics: Secondary | ICD-10-CM

## 2020-04-20 DIAGNOSIS — N39498 Other specified urinary incontinence: Secondary | ICD-10-CM

## 2020-04-20 DIAGNOSIS — N410 Acute prostatitis: Secondary | ICD-10-CM | POA: Diagnosis not present

## 2020-04-20 DIAGNOSIS — N529 Male erectile dysfunction, unspecified: Secondary | ICD-10-CM

## 2020-04-20 DIAGNOSIS — N401 Enlarged prostate with lower urinary tract symptoms: Secondary | ICD-10-CM

## 2020-04-20 DIAGNOSIS — G4700 Insomnia, unspecified: Secondary | ICD-10-CM

## 2020-04-20 DIAGNOSIS — E559 Vitamin D deficiency, unspecified: Secondary | ICD-10-CM

## 2020-04-20 DIAGNOSIS — Z85038 Personal history of other malignant neoplasm of large intestine: Secondary | ICD-10-CM

## 2020-04-20 DIAGNOSIS — K5790 Diverticulosis of intestine, part unspecified, without perforation or abscess without bleeding: Secondary | ICD-10-CM

## 2020-04-20 DIAGNOSIS — R350 Frequency of micturition: Secondary | ICD-10-CM

## 2020-04-20 DIAGNOSIS — I081 Rheumatic disorders of both mitral and tricuspid valves: Secondary | ICD-10-CM

## 2020-04-20 DIAGNOSIS — E876 Hypokalemia: Secondary | ICD-10-CM

## 2020-04-20 DIAGNOSIS — I272 Pulmonary hypertension, unspecified: Secondary | ICD-10-CM | POA: Diagnosis not present

## 2020-04-20 DIAGNOSIS — Z452 Encounter for adjustment and management of vascular access device: Secondary | ICD-10-CM | POA: Diagnosis not present

## 2020-04-20 DIAGNOSIS — M1990 Unspecified osteoarthritis, unspecified site: Secondary | ICD-10-CM

## 2020-04-20 DIAGNOSIS — Z87891 Personal history of nicotine dependence: Secondary | ICD-10-CM

## 2020-04-20 DIAGNOSIS — E785 Hyperlipidemia, unspecified: Secondary | ICD-10-CM

## 2020-04-20 DIAGNOSIS — I700 Atherosclerosis of aorta: Secondary | ICD-10-CM

## 2020-04-20 DIAGNOSIS — I808 Phlebitis and thrombophlebitis of other sites: Secondary | ICD-10-CM | POA: Diagnosis not present

## 2020-04-20 DIAGNOSIS — I739 Peripheral vascular disease, unspecified: Secondary | ICD-10-CM | POA: Diagnosis not present

## 2020-04-20 DIAGNOSIS — L03113 Cellulitis of right upper limb: Secondary | ICD-10-CM | POA: Diagnosis not present

## 2020-04-20 DIAGNOSIS — I10 Essential (primary) hypertension: Secondary | ICD-10-CM | POA: Diagnosis not present

## 2020-04-20 DIAGNOSIS — L03116 Cellulitis of left lower limb: Secondary | ICD-10-CM | POA: Diagnosis not present

## 2020-04-20 NOTE — Telephone Encounter (Signed)
Plan of care signed and faxed back to [facility] at [phone]. Form sent for scanning.  

## 2020-04-23 ENCOUNTER — Telehealth: Payer: Self-pay | Admitting: Internal Medicine

## 2020-04-23 MED ORDER — ZOLPIDEM TARTRATE 10 MG PO TABS
5.0000 mg | ORAL_TABLET | Freq: Every evening | ORAL | 2 refills | Status: DC | PRN
Start: 2020-04-23 — End: 2021-01-09

## 2020-04-23 MED ORDER — OMEPRAZOLE 40 MG PO CPDR
40.0000 mg | DELAYED_RELEASE_CAPSULE | Freq: Every day | ORAL | 3 refills | Status: DC
Start: 2020-04-23 — End: 2021-05-09

## 2020-04-23 NOTE — Telephone Encounter (Signed)
Requesting: Ambien 10mg  Contract: 04/18/2020 UDS: N/A Last Visit: 04/18/2020 Next Visit: 05/08/2020 Last Refill: 02/20/2020 #30 and 0RF Pt sig: 1/2 to 1 tab qhs prn   Please Advise

## 2020-04-23 NOTE — Telephone Encounter (Signed)
Medication:    zolpidem (AMBIEN) 10 MG tablet [[id number]]  omeprazole (PRILOSEC) 40 MG capsule [[id number]]    Has the patient contacted their pharmacy?  (If no, request that the patient contact the pharmacy for the refill.) (If yes, when and what did the pharmacy advise?)     Preferred Pharmacy (with phone number or street name): [id number] - [place], [place] - [address]  [address] [place], [place] [address]  Phone:  [phone] Fax:  [phone]      Agent: Please be advised that RX refills may take up to 3 business days. We ask that you follow-up with your pharmacy.

## 2020-04-23 NOTE — Telephone Encounter (Signed)
PDMP okay, RF sent 

## 2020-04-24 DIAGNOSIS — L03113 Cellulitis of right upper limb: Secondary | ICD-10-CM | POA: Diagnosis not present

## 2020-04-24 DIAGNOSIS — Z452 Encounter for adjustment and management of vascular access device: Secondary | ICD-10-CM | POA: Diagnosis not present

## 2020-04-24 DIAGNOSIS — L03116 Cellulitis of left lower limb: Secondary | ICD-10-CM | POA: Diagnosis not present

## 2020-04-24 DIAGNOSIS — L03115 Cellulitis of right lower limb: Secondary | ICD-10-CM | POA: Diagnosis not present

## 2020-04-24 DIAGNOSIS — N390 Urinary tract infection, site not specified: Secondary | ICD-10-CM | POA: Diagnosis not present

## 2020-04-24 DIAGNOSIS — A400 Sepsis due to streptococcus, group A: Secondary | ICD-10-CM | POA: Diagnosis not present

## 2020-04-26 ENCOUNTER — Telehealth: Payer: Self-pay | Admitting: *Deleted

## 2020-04-26 NOTE — Telephone Encounter (Signed)
Just CMP and CBC

## 2020-04-26 NOTE — Telephone Encounter (Signed)
Pt has lab appt tomorrow. There are future orders from 10/27 and 3/15.  Are we doing all of those orders. The Lipid panel was done in July.  Please advise.

## 2020-04-27 ENCOUNTER — Other Ambulatory Visit (INDEPENDENT_AMBULATORY_CARE_PROVIDER_SITE_OTHER): Payer: Medicare Other

## 2020-04-27 ENCOUNTER — Other Ambulatory Visit: Payer: Self-pay

## 2020-04-27 DIAGNOSIS — A419 Sepsis, unspecified organism: Secondary | ICD-10-CM | POA: Diagnosis not present

## 2020-04-27 DIAGNOSIS — L03119 Cellulitis of unspecified part of limb: Secondary | ICD-10-CM | POA: Diagnosis not present

## 2020-04-27 DIAGNOSIS — N419 Inflammatory disease of prostate, unspecified: Secondary | ICD-10-CM

## 2020-04-27 LAB — COMPREHENSIVE METABOLIC PANEL
AG Ratio: 1.7 (calc) (ref 1.0–2.5)
ALT: 14 U/L (ref 9–46)
AST: 13 U/L (ref 10–35)
Albumin: 3.9 g/dL (ref 3.6–5.1)
Alkaline phosphatase (APISO): 69 U/L (ref 35–144)
BUN: 12 mg/dL (ref 7–25)
CO2: 26 mmol/L (ref 20–32)
Calcium: 8.7 mg/dL (ref 8.6–10.3)
Chloride: 102 mmol/L (ref 98–110)
Creat: 0.81 mg/dL (ref 0.70–1.25)
Globulin: 2.3 g/dL (calc) (ref 1.9–3.7)
Glucose, Bld: 116 mg/dL — ABNORMAL HIGH (ref 65–99)
Potassium: 4 mmol/L (ref 3.5–5.3)
Sodium: 138 mmol/L (ref 135–146)
Total Bilirubin: 0.4 mg/dL (ref 0.2–1.2)
Total Protein: 6.2 g/dL (ref 6.1–8.1)

## 2020-04-27 LAB — CBC WITH DIFFERENTIAL/PLATELET
Absolute Monocytes: 564 cells/uL (ref 200–950)
Basophils Absolute: 31 cells/uL (ref 0–200)
Basophils Relative: 0.5 %
Eosinophils Absolute: 229 cells/uL (ref 15–500)
Eosinophils Relative: 3.7 %
HCT: 38 % — ABNORMAL LOW (ref 38.5–50.0)
Hemoglobin: 13.1 g/dL — ABNORMAL LOW (ref 13.2–17.1)
Lymphs Abs: 2337 cells/uL (ref 850–3900)
MCH: 34 pg — ABNORMAL HIGH (ref 27.0–33.0)
MCHC: 34.5 g/dL (ref 32.0–36.0)
MCV: 98.7 fL (ref 80.0–100.0)
MPV: 10.3 fL (ref 7.5–12.5)
Monocytes Relative: 9.1 %
Neutro Abs: 3038 cells/uL (ref 1500–7800)
Neutrophils Relative %: 49 %
Platelets: 220 10*3/uL (ref 140–400)
RBC: 3.85 10*6/uL — ABNORMAL LOW (ref 4.20–5.80)
RDW: 12.6 % (ref 11.0–15.0)
Total Lymphocyte: 37.7 %
WBC: 6.2 10*3/uL (ref 3.8–10.8)

## 2020-04-27 NOTE — Telephone Encounter (Signed)
Thank you.  Orders adjusted per below.

## 2020-05-02 ENCOUNTER — Telehealth: Payer: Self-pay | Admitting: Internal Medicine

## 2020-05-02 ENCOUNTER — Other Ambulatory Visit: Payer: Self-pay

## 2020-05-02 ENCOUNTER — Ambulatory Visit (HOSPITAL_BASED_OUTPATIENT_CLINIC_OR_DEPARTMENT_OTHER)
Admission: RE | Admit: 2020-05-02 | Discharge: 2020-05-02 | Disposition: A | Discharge status: Home / Self Care | Payer: Medicare Other | Source: Ambulatory Visit | Attending: Internal Medicine | Admitting: Internal Medicine

## 2020-05-02 ENCOUNTER — Ambulatory Visit (INDEPENDENT_AMBULATORY_CARE_PROVIDER_SITE_OTHER): Payer: Medicare Other

## 2020-05-02 DIAGNOSIS — Z23 Encounter for immunization: Secondary | ICD-10-CM

## 2020-05-02 DIAGNOSIS — M79641 Pain in right hand: Secondary | ICD-10-CM | POA: Insufficient documentation

## 2020-05-02 NOTE — Telephone Encounter (Signed)
Patient states he injured his right hand and would like an xray. Patient states he spoke to Dr. Larose Kells about it on his last visit and does not want to schedule appt.

## 2020-05-02 NOTE — Telephone Encounter (Signed)
Spoke w/ Pt- he injured his R hand (first set of knuckles on hand and 3rd and 4th fingers) on 03/29/20 (was admitted to hospital on 03/30/20). No open wound, swelling or redness. Informed to come by radiology dept for x-ray. Orders placed. He would also like a flu shot while here. Scheduled for 3:30pm.

## 2020-05-02 NOTE — Telephone Encounter (Signed)
Please advise 

## 2020-05-02 NOTE — Telephone Encounter (Signed)
Please get more details: When was injured, where  is he hurting, is there are open wound, any swelling. After that ok to rx a  hand x-ray.

## 2020-05-05 DIAGNOSIS — Z452 Encounter for adjustment and management of vascular access device: Secondary | ICD-10-CM | POA: Diagnosis not present

## 2020-05-08 ENCOUNTER — Ambulatory Visit: Payer: Medicare Other | Admitting: Internal Medicine

## 2020-05-29 ENCOUNTER — Other Ambulatory Visit: Payer: Self-pay | Admitting: Internal Medicine

## 2020-05-29 NOTE — Telephone Encounter (Signed)
Patient is checking the status of medicatin

## 2020-06-06 DIAGNOSIS — R3129 Other microscopic hematuria: Secondary | ICD-10-CM | POA: Diagnosis not present

## 2020-06-06 DIAGNOSIS — N401 Enlarged prostate with lower urinary tract symptoms: Secondary | ICD-10-CM | POA: Diagnosis not present

## 2020-06-06 DIAGNOSIS — R972 Elevated prostate specific antigen [PSA]: Secondary | ICD-10-CM | POA: Diagnosis not present

## 2020-06-06 DIAGNOSIS — N138 Other obstructive and reflux uropathy: Secondary | ICD-10-CM | POA: Diagnosis not present

## 2020-06-18 ENCOUNTER — Other Ambulatory Visit: Payer: Self-pay | Admitting: Internal Medicine

## 2020-07-04 ENCOUNTER — Ambulatory Visit (INDEPENDENT_AMBULATORY_CARE_PROVIDER_SITE_OTHER): Payer: Medicare Other | Admitting: Diagnostic Neuroimaging

## 2020-07-04 ENCOUNTER — Encounter: Payer: Self-pay | Admitting: Diagnostic Neuroimaging

## 2020-07-04 ENCOUNTER — Other Ambulatory Visit: Payer: Self-pay

## 2020-07-04 ENCOUNTER — Telehealth: Payer: Self-pay | Admitting: Diagnostic Neuroimaging

## 2020-07-04 VITALS — BP 160/82 | HR 79 | Ht 70.0 in | Wt 299.0 lb

## 2020-07-04 DIAGNOSIS — M48062 Spinal stenosis, lumbar region with neurogenic claudication: Secondary | ICD-10-CM | POA: Diagnosis not present

## 2020-07-04 DIAGNOSIS — M79672 Pain in left foot: Secondary | ICD-10-CM

## 2020-07-04 DIAGNOSIS — R6889 Other general symptoms and signs: Secondary | ICD-10-CM | POA: Diagnosis not present

## 2020-07-04 DIAGNOSIS — M79671 Pain in right foot: Secondary | ICD-10-CM

## 2020-07-04 DIAGNOSIS — Z79899 Other long term (current) drug therapy: Secondary | ICD-10-CM

## 2020-07-04 DIAGNOSIS — M4807 Spinal stenosis, lumbosacral region: Secondary | ICD-10-CM

## 2020-07-04 NOTE — Telephone Encounter (Signed)
Medicare/mutual of [place] order sent to GI. No auth they will reach out to the patient to schedule.  

## 2020-07-04 NOTE — Progress Notes (Signed)
[facility]  PATIENT: Robert BECRAFT DOB: Dec 20, 1950  REFERRING CLINICIAN: Colon Branch, MD HISTORY FROM: patient REASON FOR VISIT: new consult    HISTORICAL  CHIEF COMPLAINT:  Chief Complaint  Patient presents with  . Neuropathy    Rm 6 New Pt  "shooting pain, tingling in both feet up to ankles; had skin infection in both LE 2 months ago"     HISTORY OF PRESENT ILLNESS:   70 year old male here for evaluation of neuropathy.  Patient has had pain and numbness in feet and ankles since 2000.  Symptoms have progressed over time.  Initially she saw a foot doctor, was diagnosed with plantar fasciitis and underwent surgery which did not help.  He is also had significant low back pain, numbness, tingling, burning, stabbing sensations in his toes feet and ankles up to his shins.  Symptoms have progressed over time.  Also had knee surgeries in 2019.  Also had low back surgery in 1990, with follow-up MRI scan in 2010 showing lumbar spinal stenosis.  He did not pursue further treatments for his low back since that time.  Patient tried gabapentin 80 mg 4 times a day with some relief, but then wanted to reduce this because he was concerned about long-term side effects.   REVIEW OF SYSTEMS: Full 14 system review of systems performed and negative with exception of: As per HPI.  ALLERGIES: Allergies  Allergen Reactions  . Codeine Itching  . Nsaids     Acid Reflux  . Simvastatin     Mimics heart attack  . Tolmetin     Acid Reflux    HOME MEDICATIONS: Outpatient Medications Prior to Visit  Medication Sig Dispense Refill  . albuterol (VENTOLIN HFA) 108 (90 Base) MCG/ACT inhaler Inhale 2 puffs into the lungs every 6 (six) hours as needed for wheezing or shortness of breath. 1 Inhaler 1  . Ascorbic Acid (VITAMIN C) 1000 MG tablet Take 1,000 mg by mouth daily.    Marland Kitchen aspirin 81 MG tablet Take 81 mg by mouth daily.    Marland Kitchen buPROPion (WELLBUTRIN XL) 300 MG 24 hr tablet Take 1  tablet (300 mg total) by mouth daily. 90 tablet 1  . Calcium Carbonate-Vit D-Min (CALCIUM 1200) 1200-1000 MG-UNIT CHEW Chew 1 tablet by mouth daily.     . Coenzyme Q10 200 MG capsule Take 200 mg by mouth daily.    Marland Kitchen escitalopram (LEXAPRO) 20 MG tablet TAKE 1 TABLET(20 MG) BY MOUTH DAILY 90 tablet 3  . ezetimibe (ZETIA) 10 MG tablet Take 1 tablet (10 mg total) by mouth daily. 90 tablet 3  . gabapentin (NEURONTIN) 800 MG tablet Take 1 tablet (800 mg total) by mouth 2 (two) times daily. 180 tablet 1  . lisinopril (ZESTRIL) 40 MG tablet Take 1 tablet (40 mg total) by mouth daily. 90 tablet 1  . magnesium oxide (MAG-OX) 400 MG tablet Take 400 mg by mouth daily.    . Melatonin 5 MG CAPS Take 10 mg by mouth at bedtime. Patient stated that he is taking 10 mg daily    . metoprolol succinate (TOPROL-XL) 100 MG 24 hr tablet Take 1 tablet (100 mg total) by mouth daily. Take with or immediately following a meal. 90 tablet 1  . Multiple Vitamins-Minerals (MULTIVITAMIN WITH MINERALS) tablet Take 1 tablet by mouth daily.    . Omega-3 Fat Ac-Cholecalciferol (FLEX OMEGA BENEFITS/VIT D-3 PO) Take 2 capsules by mouth daily.    Marland Kitchen omeprazole (PRILOSEC) 40 MG capsule Take  1 capsule (40 mg total) by mouth daily. 90 capsule 3  . triamcinolone (NASACORT) 55 MCG/ACT AERO nasal inhaler Place 2 sprays into the nose at bedtime.    Marland Kitchen zolpidem (AMBIEN) 10 MG tablet Take 0.5-1 tablets (5-10 mg total) by mouth at bedtime as needed for sleep. 30 tablet 2  . b complex vitamins capsule Take 1 capsule by mouth daily. (Patient not taking: Reported on 07/04/2020)     No facility-administered medications prior to visit.    PAST MEDICAL HISTORY: Past Medical History:  Diagnosis Date  . Anxiety and depression   . Arthritis   . Barrett esophagus   . BPH (benign prostatic hyperplasia)   . CTS (carpal tunnel syndrome)    h/o  . Edema of both legs    legs , ankles and feet  . Erectile dysfunction   . GERD (gastroesophageal reflux  disease) 03/18/2012  . H/O hiatal hernia   . HTN (hypertension)   . Hyperlipidemia   . Microscopic hematuria   . Neuropathy   . OSA (obstructive sleep apnea) 03/18/2012   on BiPap  . Prostatitis   . PVD (peripheral vascular disease) ([place])   . Spinal stenosis 06-2012   saw neuro w/ "neuropathy", dx w/ spinal stenosis  . Varicose veins   . Vitamin D deficiency   . Wears glasses     PAST SURGICAL HISTORY: Past Surgical History:  Procedure Laterality Date  . ARCH BAR REMOVAL    . BACK SURGERY  2002   L4-L5  . [place]   for tennis elbow-rt  . ESOPHAGOGASTRODUODENOSCOPY  04/2015   w/ biopsy, short tongue of Barrett's without obvious nodularity, mass or esophagitis seen  . EYE SURGERY Right    cataract with lens implant  . FOOT SURGERY     R, for plantar fasciitis  . HERNIA REPAIR     x 2 as a child-ing   . KNEE ARTHROSCOPY WITH MENISCAL REPAIR Bilateral 05/04/2013   Procedure: BILATERAL KNEE ARTHROSCOPY WITH POSSIBLE CHONDROPLASTY AND MENISCECTOMY ;  Surgeon: Kerin Salen, MD;  Location: [facility];  Service: Orthopedics;  Laterality: Bilateral;  Right chrondroplasty, debridement, , partial medial menisectomy, removal of loose bodies.   Marland Kitchen LIGATION / DIVISION SAPHENOUS VEIN     both legs  . PROSTATE BIOPSY     2010, (-)  . TONSILLECTOMY    . TOTAL KNEE ARTHROPLASTY Right 11/07/2013   Procedure: TOTAL KNEE ARTHROPLASTY;  Surgeon: Kerin Salen, MD;  Location: [facility];  Service: Orthopedics;  Laterality: Right;  . TOTAL KNEE ARTHROPLASTY Left 03/01/2014   Procedure: TOTAL KNEE ARTHROPLASTY;  Surgeon: Kerin Salen, MD;  Location: [facility];  Service: Orthopedics;  Laterality: Left;  . UPPER GASTROINTESTINAL ENDOSCOPY    . UVULOPALATOPHARYNGOPLASTY (UPPP)/TONSILLECTOMY/SEPTOPLASTY  1990    FAMILY HISTORY: Family History  Problem Relation Age of Onset  . CAD Father        ?  Marland Kitchen Alzheimer's disease Mother   . Stroke Brother   . Colon cancer Neg Hx   .  Prostate cancer Neg Hx   . Diabetes Neg Hx   . Cancer Neg Hx     SOCIAL HISTORY: Social History   Socioeconomic History  . Marital status: Divorced    Spouse name: Not on file  . Number of children: 1  . Years of education: Not on file  . Highest education level: Bachelor's degree (e.g., BA, AB, BS)  Occupational History  . Occupation: retired  Tobacco Use  . Smoking status: Former Smoker    Packs/day: 1.00    Years: 40.00    Pack years: 40.00    Start date: 52    Quit date: 06/29/2008    Years since quitting: 12.0  . Smokeless tobacco: Never Used  . Tobacco comment: quit 2010  Substance and Sexual Activity  . Alcohol use: Yes    Alcohol/week: 3.0 standard drinks    Types: 3 Standard drinks or equivalent per week    Comment: 2-3 large drinks, Vodka, 1-2 times a week  . Drug use: No  . Sexual activity: Yes  Other Topics Concern  . Not on file  Social History Narrative   Lives w/  fiance of > 20 years    caffeine- 2-3  c daily       Social Determinants of Health   Financial Resource Strain: Not on file  Food Insecurity: Not on file  Transportation Needs: Not on file  Physical Activity: Not on file  Stress: Not on file  Social Connections: Not on file  Intimate Partner Violence: Not on file     PHYSICAL EXAM  GENERAL EXAM/CONSTITUTIONAL: Vitals:  Vitals:   07/04/20 0855  BP: (!) 160/82  Pulse: 79  Weight: 299 lb (135.6 kg)  Height: 5' 10"  (1.778 m)     Body mass index is 42.9 kg/m. Wt Readings from Last 3 Encounters:  07/04/20 299 lb (135.6 kg)  04/18/20 287 lb 8 oz (130.4 kg)  01/02/20 299 lb 6 oz (135.8 kg)     Patient is in no distress; well developed, nourished and groomed; neck is supple  CARDIOVASCULAR:  Examination of carotid arteries is normal; no carotid bruits  Regular rate and rhythm, no murmurs  Examination of peripheral vascular system by observation and palpation is normal  EYES:  Ophthalmoscopic exam of optic discs and  posterior segments is normal; no papilledema or hemorrhages  No exam data present  MUSCULOSKELETAL:  Gait, strength, tone, movements noted in Neurologic exam below  NEUROLOGIC: MENTAL STATUS:  No flowsheet data found.  awake, alert, oriented to person, place and time  recent and remote memory intact  normal attention and concentration  language fluent, comprehension intact, naming intact  fund of knowledge appropriate  CRANIAL NERVE:   2nd - no papilledema on fundoscopic exam  2nd, 3rd, 4th, 6th - pupils equal and reactive to light, visual fields full to confrontation, extraocular muscles intact, no nystagmus  5th - facial sensation symmetric  7th - facial strength symmetric  8th - hearing intact  9th - palate elevates symmetrically, uvula midline  11th - shoulder shrug symmetric  12th - tongue protrusion midline  MOTOR:   normal bulk and tone, full strength in the BUE, BLE  SENSORY:   normal and symmetric to light touch, pinprick, temperature, vibration; EXCEPT DECR IN LENGTH DEPENDENT PATTERN IN LOWER EXT  COORDINATION:   finger-nose-finger, fine finger movements normal  REFLEXES:   deep tendon reflexes TRACE and symmetric  GAIT/STATION:   narrow based gait     DIAGNOSTIC DATA (LABS, IMAGING, TESTING) - I reviewed patient records, labs, notes, testing and imaging myself where available.  Lab Results  Component Value Date   WBC 6.2 04/27/2020   HGB 13.1 (L) 04/27/2020   HCT 38.0 (L) 04/27/2020   MCV 98.7 04/27/2020   PLT 220 04/27/2020      Component Value Date/Time   NA 138 04/27/2020 1124   K 4.0 04/27/2020 1124   CL 102  04/27/2020 1124   CO2 26 04/27/2020 1124   GLUCOSE 116 (H) 04/27/2020 1124   BUN 12 04/27/2020 1124   CREATININE 0.81 04/27/2020 1124   CALCIUM 8.7 04/27/2020 1124   PROT 6.2 04/27/2020 1124   ALBUMIN 4.0 09/02/2019 1356   AST 13 04/27/2020 1124   ALT 14 04/27/2020 1124   ALKPHOS 62 09/02/2019 1356   BILITOT  0.4 04/27/2020 1124   GFRNONAA >60 07/28/2015 1345   GFRAA >60 07/28/2015 1345   Lab Results  Component Value Date   CHOL 142 01/02/2020   HDL 43.20 01/02/2020   LDLCALC 80 01/02/2020   TRIG 97.0 01/02/2020   CHOLHDL 3 01/02/2020   Lab Results  Component Value Date   HGBA1C 5.5 09/02/2019   No results found for: [id number] Lab Results  Component Value Date   TSH 1.49 06/24/2017       ASSESSMENT AND PLAN  70 y.o. year old male here with numbness, pain, burning in feet and legs, chronic low back pain, bilateral knee surgeries, with history of lumbar spinal stenosis.  May also have signs and symptoms of neuropathy.  We will proceed with neuropathy labs and MRI of the lumbar spine.  Dx:  1. Pain in both feet   2. Other general symptoms and signs   3. Long-term use of high-risk medication   4. Spinal stenosis of lumbar region with neurogenic claudication   5. Spinal stenosis of lumbosacral region      PLAN:  - check MRI lumbar spine  - check neuropathy labs - continue gabapentin - encouraged patient to start exercise program with gradual stretching and walking, consider PT evaluation, and optimize nutrition and weight loss program  Orders Placed This Encounter  Procedures  . MR LUMBAR SPINE WO CONTRAST  . CBC with diff  . CMP  . Vitamin B12  . MMA  . Homocysteine  . A1c  . TSH  . SPEP with IFE  . ANA w/Reflex  . SSA, SSB  . ESR  . CRP  . Ambulatory referral to Physical Therapy    Return pending test results, for pending if symptoms worsen or fail to improve.    Penni Bombard, MD 10/17/8339, 9:62 AM Certified in Neurology, Neurophysiology and Neuroimaging  [facility] [address], [place], [place] [address] [phone]

## 2020-07-20 ENCOUNTER — Ambulatory Visit: Payer: Medicare Other | Admitting: Internal Medicine

## 2020-07-20 DIAGNOSIS — Z0289 Encounter for other administrative examinations: Secondary | ICD-10-CM

## 2020-07-24 ENCOUNTER — Ambulatory Visit: Payer: Medicare Other | Admitting: Physical Therapy

## 2020-07-27 ENCOUNTER — Other Ambulatory Visit: Payer: Self-pay

## 2020-07-27 ENCOUNTER — Ambulatory Visit
Admission: RE | Admit: 2020-07-27 | Discharge: 2020-07-27 | Disposition: A | Discharge status: Home / Self Care | Payer: Medicare Other | Source: Ambulatory Visit | Attending: Diagnostic Neuroimaging | Admitting: Diagnostic Neuroimaging

## 2020-07-27 ENCOUNTER — Ambulatory Visit: Payer: Medicare Other | Admitting: Internal Medicine

## 2020-07-27 DIAGNOSIS — M79672 Pain in left foot: Secondary | ICD-10-CM

## 2020-07-27 DIAGNOSIS — R6889 Other general symptoms and signs: Secondary | ICD-10-CM

## 2020-07-27 DIAGNOSIS — M48061 Spinal stenosis, lumbar region without neurogenic claudication: Secondary | ICD-10-CM | POA: Diagnosis not present

## 2020-07-27 DIAGNOSIS — M4807 Spinal stenosis, lumbosacral region: Secondary | ICD-10-CM

## 2020-07-27 DIAGNOSIS — M48062 Spinal stenosis, lumbar region with neurogenic claudication: Secondary | ICD-10-CM

## 2020-07-27 DIAGNOSIS — M545 Low back pain, unspecified: Secondary | ICD-10-CM | POA: Diagnosis not present

## 2020-07-27 DIAGNOSIS — M79671 Pain in right foot: Secondary | ICD-10-CM

## 2020-07-27 DIAGNOSIS — Z79899 Other long term (current) drug therapy: Secondary | ICD-10-CM

## 2020-08-01 ENCOUNTER — Other Ambulatory Visit: Payer: Self-pay

## 2020-08-01 DIAGNOSIS — Z23 Encounter for immunization: Secondary | ICD-10-CM | POA: Diagnosis not present

## 2020-08-01 DIAGNOSIS — Z87891 Personal history of nicotine dependence: Secondary | ICD-10-CM

## 2020-08-03 ENCOUNTER — Other Ambulatory Visit: Payer: Self-pay

## 2020-08-03 ENCOUNTER — Encounter: Payer: Self-pay | Admitting: Internal Medicine

## 2020-08-03 ENCOUNTER — Ambulatory Visit (INDEPENDENT_AMBULATORY_CARE_PROVIDER_SITE_OTHER): Payer: Medicare Other | Admitting: Internal Medicine

## 2020-08-03 VITALS — BP 148/85 | HR 81 | Temp 98.1°F | Resp 18 | Ht 70.0 in | Wt 292.5 lb

## 2020-08-03 DIAGNOSIS — I10 Essential (primary) hypertension: Secondary | ICD-10-CM | POA: Diagnosis not present

## 2020-08-03 DIAGNOSIS — F32A Depression, unspecified: Secondary | ICD-10-CM | POA: Diagnosis not present

## 2020-08-03 DIAGNOSIS — F419 Anxiety disorder, unspecified: Secondary | ICD-10-CM | POA: Diagnosis not present

## 2020-08-03 DIAGNOSIS — R319 Hematuria, unspecified: Secondary | ICD-10-CM | POA: Diagnosis not present

## 2020-08-03 DIAGNOSIS — M4800 Spinal stenosis, site unspecified: Secondary | ICD-10-CM

## 2020-08-03 DIAGNOSIS — N401 Enlarged prostate with lower urinary tract symptoms: Secondary | ICD-10-CM

## 2020-08-03 DIAGNOSIS — G4733 Obstructive sleep apnea (adult) (pediatric): Secondary | ICD-10-CM | POA: Diagnosis not present

## 2020-08-03 LAB — POC URINALSYSI DIPSTICK (AUTOMATED)
Bilirubin, UA: NEGATIVE
Glucose, UA: NEGATIVE
Ketones, UA: NEGATIVE
Leukocytes, UA: NEGATIVE
Nitrite, UA: NEGATIVE
Protein, UA: POSITIVE — AB
Spec Grav, UA: >=1.03 — AB (ref 1.010–1.025)
Urobilinogen, UA: 0.2 E.U./dL
pH, UA: 5.5 (ref 5.0–8.0)

## 2020-08-03 LAB — PSA: PSA: 7.54 ng/mL — ABNORMAL HIGH (ref <=4.0)

## 2020-08-03 MED ORDER — METOPROLOL SUCCINATE ER 100 MG PO TB24: 150.0000 mg | ORAL_TABLET | Freq: Every day | ORAL | 1 refills | Status: DC

## 2020-08-03 NOTE — Patient Instructions (Addendum)
Increase metoprolol 100 mg to 1.5 tablets daily Other medications the same Check the  blood pressure 2 or 3 times a week.   BP GOAL is between 110/65 and  135/85. If it is consistently higher or lower, let me know   Use your CPAP every night  GO TO THE LAB : Get the blood work     [place], [place] back for for a checkup in 3 months           Advance Directive  Advance directives are legal documents that allow you to make decisions about your health care and medical treatment in case you become unable to communicate for yourself. Advance directives let your wishes be known to family, friends, and health care providers. Discussing and writing advance directives should happen over time rather than all at once. Advance directives can be changed and updated at any time. There are different types of advance directives, such as:  Medical power of attorney.  Living will.  Do not resuscitate (DNR) order or do not attempt resuscitation (DNAR) order. Health care proxy and medical power of attorney A health care proxy is also called a health care agent. This person is appointed to make medical decisions for you when you are unable to make decisions for yourself. Generally, people ask a trusted friend or family member to act as their proxy and represent their preferences. Make sure you have an agreement with your trusted person to act as your proxy. A proxy may have to make a medical decision on your behalf if your wishes are not known. A medical power of attorney, also called a durable power of attorney for health care, is a legal document that names your health care proxy. Depending on the laws in your state, the document may need to be:  Signed.  Notarized.  Dated.  Copied.  Witnessed.  Incorporated into your medical record. You may also want to appoint a trusted person to manage your money in the event you are unable to do so. This  is called a durable power of attorney for finances. It is a separate legal document from the durable power of attorney for health care. You may choose your health care proxy or someone different to act as your agent in money matters. If you do not appoint a proxy, or there is a concern that the proxy is not acting in your best interest, a court may appoint a guardian to act on your behalf. Living will A living will is a set of instructions that state your wishes about medical care when you cannot express them yourself. Health care providers should keep a copy of your living will in your medical record. You may want to give a copy to family members or friends. To alert caregivers in case of an emergency, you can place a card in your wallet to let them know that you have a living will and where they can find it. A living will is used if you become:  Terminally ill.  Disabled.  Unable to communicate or make decisions. The following decisions should be included in your living will:  To use or not to use life support equipment, such as dialysis machines and breathing machines (ventilators).  Whether you want a DNR or DNAR order. This tells health care providers not to use cardiopulmonary resuscitation (CPR) if breathing or heartbeat stops.  To use or not to use tube feeding.  To be  given or not to be given food and fluids.  Whether you want comfort (palliative) care when the goal becomes comfort rather than a cure.  Whether you want to donate your organs and tissues. A living will does not give instructions for distributing your money and property if you should pass away. DNR or DNAR A DNR or DNAR order is a request not to have CPR in the event that your heart stops beating or you stop breathing. If a DNR or DNAR order has not been made and shared, a health care provider will try to help any patient whose heart has stopped or who has stopped breathing. If you plan to have surgery, talk with your  health care provider about how your DNR or DNAR order will be followed if problems occur. What if I do not have an advance directive? Some states assign family decision makers to act on your behalf if you do not have an advance directive. Each state has its own laws about advance directives. You may want to check with your health care provider, attorney, or state representative about the laws in your state. Summary  Advance directives are legal documents that allow you to make decisions about your health care and medical treatment in case you become unable to communicate for yourself.  The process of discussing and writing advance directives should happen over time. You can change and update advance directives at any time.  Advance directives may include a medical power of attorney, a living will, and a DNR or DNAR order. This information is not intended to replace advice given to you by your health care provider. Make sure you discuss any questions you have with your health care provider. Document Revised: 03/13/2020 Document Reviewed: 03/13/2020 Elsevier Patient Education  2021 [employer].

## 2020-08-03 NOTE — Progress Notes (Signed)
Pre visit review using our clinic review tool, if applicable. No additional management support is needed unless otherwise documented below in the visit note. 

## 2020-08-03 NOTE — Progress Notes (Signed)
Subjective:    Patient ID: Robert Gibbs, male    DOB: 10/01/1950, 70 y.o.   MRN: [id number]  DOS:  08/03/2020 Type of visit - description: Follow-up  Discussed multiple issues  HTN: BP slightly elevated today, no ambulatory BPs LUTS: He has chronic urinary urgency and frequency, symptoms are slightly worse lately; girlfriend has noted a "smell" of the urine. He denies dysuria, difficulty urinating or gross hematuria. He is urgency has increased however.  Anxiety depression: Good med compliance, not well controlled.  Denies suicidal ideas  OSA: Not using CPAP regularly     Wt Readings from Last 3 Encounters:  08/03/20 292 lb 8 oz (132.7 kg)  07/04/20 299 lb (135.6 kg)  04/18/20 287 lb 8 oz (130.4 kg)     Review of Systems See above   Past Medical History:  Diagnosis Date  . Anxiety and depression   . Arthritis   . Barrett esophagus   . BPH (benign prostatic hyperplasia)   . CTS (carpal tunnel syndrome)    h/o  . Edema of both legs    legs , ankles and feet  . Erectile dysfunction   . GERD (gastroesophageal reflux disease) 03/18/2012  . H/O hiatal hernia   . HTN (hypertension)   . Hyperlipidemia   . Microscopic hematuria   . Neuropathy   . OSA (obstructive sleep apnea) 03/18/2012   on BiPap  . Prostatitis   . PVD (peripheral vascular disease) ([place])   . Spinal stenosis 06-2012   saw neuro w/ "neuropathy", dx w/ spinal stenosis  . Varicose veins   . Vitamin D deficiency   . Wears glasses     Past Surgical History:  Procedure Laterality Date  . ARCH BAR REMOVAL    . BACK SURGERY  2002   L4-L5  . [place]   for tennis elbow-rt  . ESOPHAGOGASTRODUODENOSCOPY  04/2015   w/ biopsy, short tongue of Barrett's without obvious nodularity, mass or esophagitis seen  . EYE SURGERY Right    cataract with lens implant  . FOOT SURGERY     R, for plantar fasciitis  . HERNIA REPAIR     x 2 as a child-ing   . KNEE ARTHROSCOPY WITH MENISCAL REPAIR  Bilateral 05/04/2013   Procedure: BILATERAL KNEE ARTHROSCOPY WITH POSSIBLE CHONDROPLASTY AND MENISCECTOMY ;  Surgeon: Kerin Salen, MD;  Location: [place];  Service: Orthopedics;  Laterality: Bilateral;  Right chrondroplasty, debridement, , partial medial menisectomy, removal of loose bodies.   Marland Kitchen LIGATION / DIVISION SAPHENOUS VEIN     both legs  . PROSTATE BIOPSY     2010, (-)  . TONSILLECTOMY    . TOTAL KNEE ARTHROPLASTY Right 11/07/2013   Procedure: TOTAL KNEE ARTHROPLASTY;  Surgeon: Kerin Salen, MD;  Location: [place];  Service: Orthopedics;  Laterality: Right;  . TOTAL KNEE ARTHROPLASTY Left 03/01/2014   Procedure: TOTAL KNEE ARTHROPLASTY;  Surgeon: Kerin Salen, MD;  Location: [place];  Service: Orthopedics;  Laterality: Left;  . UPPER GASTROINTESTINAL ENDOSCOPY    . UVULOPALATOPHARYNGOPLASTY (UPPP)/TONSILLECTOMY/SEPTOPLASTY  1990    Allergies as of 08/03/2020      Reactions   Codeine Itching   Nsaids    Acid Reflux   Simvastatin    Mimics heart attack   Tolmetin    Acid Reflux      Medication List       Accurate as of August 03, 2020 11:59 PM. If you have any questions, ask  your nurse or doctor.        STOP taking these medications   b complex vitamins capsule Stopped by: Kathlene November, MD     TAKE these medications   albuterol 108 (90 Base) MCG/ACT inhaler Commonly known as: Ventolin HFA Inhale 2 puffs into the lungs every 6 (six) hours as needed for wheezing or shortness of breath.   aspirin 81 MG tablet Take 81 mg by mouth daily.   buPROPion 300 MG 24 hr tablet Commonly known as: WELLBUTRIN XL Take 1 tablet (300 mg total) by mouth daily.   Calcium 1200 1200-1000 MG-UNIT Chew Chew 1 tablet by mouth daily.   Coenzyme Q10 200 MG capsule Take 200 mg by mouth daily.   escitalopram 20 MG tablet Commonly known as: LEXAPRO TAKE 1 TABLET(20 MG) BY MOUTH DAILY   ezetimibe 10 MG tablet Commonly known as: Zetia Take 1 tablet (10 mg total) by  mouth daily.   FLEX OMEGA BENEFITS/VIT D-3 PO Take 2 capsules by mouth daily.   gabapentin 800 MG tablet Commonly known as: NEURONTIN Take 1 tablet (800 mg total) by mouth 2 (two) times daily.   lisinopril 40 MG tablet Commonly known as: ZESTRIL Take 1 tablet (40 mg total) by mouth daily.   magnesium oxide 400 MG tablet Commonly known as: MAG-OX Take 400 mg by mouth daily.   Melatonin 5 MG Caps Take 10 mg by mouth at bedtime. Patient stated that he is taking 10 mg daily   metoprolol succinate 100 MG 24 hr tablet Commonly known as: TOPROL-XL Take 1.5 tablets (150 mg total) by mouth daily. Take with or immediately following a meal. What changed: how much to take Changed by: Kathlene November, MD   multivitamin with minerals tablet Take 1 tablet by mouth daily.   omeprazole 40 MG capsule Commonly known as: PRILOSEC Take 1 capsule (40 mg total) by mouth daily.   triamcinolone 55 MCG/ACT Aero nasal inhaler Commonly known as: NASACORT Place 2 sprays into the nose at bedtime.   vitamin C 1000 MG tablet Take 1,000 mg by mouth daily.   zolpidem 10 MG tablet Commonly known as: AMBIEN Take 0.5-1 tablets (5-10 mg total) by mouth at bedtime as needed for sleep.          Objective:   Physical Exam BP (!) 148/85 (BP Location: Left Arm, Patient Position: Sitting, Cuff Size: Normal)   Pulse 81   Temp 98.1 F (36.7 C) (Oral)   Resp 18   Ht 5\' 10"  (1.778 m)   Wt 292 lb 8 oz (132.7 kg)   SpO2 94%   BMI 41.97 kg/m  General:   Well developed, NAD, BMI noted.  HEENT:  Normocephalic . Face symmetric, atraumatic Lungs:  CTA B Normal respiratory effort, no intercostal retractions, no accessory muscle use. Heart: RRR,  no murmur.  Abdomen:  Not distended, soft, non-tender. No rebound or rigidity.  Dialysis recti noted Skin: Not pale. Not jaundice Lower extremities: Pretibial areas slightly red but not warm, not tender, trace edema.  Ankles measured: Symmetric. DRE: Normal  sphincter tone, no stools, somewhat difficult to reach gland, not tender, + enlarged. Neurologic:  alert & oriented X3.  Speech normal, gait appropriate for age and unassisted Psych--  Cognition and judgment appear intact.  Cooperative with normal attention span and concentration.  Behavior appropriate. No anxious or depressed appearing.     Assessment     Assessment Hyperglycemia HTN Hyperlipidemia: s/e  with simvastatin, declined other statins 08-2019 Chronic lower  extremity edema w/ some pretibial redness  (ER 2-17, [place] neg DVT, BNP wnl) anxiety depression, insomnia COPD: Per CT 03-2016 Morbid obesity GI: GERD, Barrett's  esophagus OSA, BiPAP, per pulmonary GU: Dr Felipa Eth  --LUTS , failed a # of medications --BPH, elevated PSA:  (-)  biopsy 2009, PSA 05/2014 2.92 --ED --Hematuria, CT, cystoscopy 2009 negative --Prostatitis, admitted 03-2020 Vitamin D deficiency-  MSK: -- Spinal stenosis : saw neurology w/ sx of paresthesias feet-ankle, sx felt to be d/t spinal stenosis not neuropathy, see OV note 08-07-2012 , used to see pain med (Dr Vira Blanco) -High Risk UDS 2016 --CTS Diastasis recti  PLAN Leg cellulitis: see last OV.Resolved.  Pretibial areas remain slightly pink but not TTP or warm HTN:On lisinopril, metoprolol.  BP slightly elevated today, pulse 81, increase metoprolol to 150 mg.  Monitor BPs Neuropathy: Saw neurology 07/04/2020, they rec: - neuropathy labs (not done, encouraged to proceed) - MRI lumbar spine: showed worsening DJD changes compared to 2010, Rx conservative mngmt but consider surgery BPH, LUTS, elevated PSA, h/o microscopic hematuria  Saw urology 06/06/2020, DRE Nontender gland, no nodules, urinalysis: 9-15 RBCs, was Rx Myrbetriq, could not afford it. Presents today with increased chronic LUTS.  UA show blood, Rx UCX, PSA . Further advised with results. OSA: Not using CPAP, again encouraged to do, risks discussed. Anxiety, depression, insomnia: Not  well controlled, he lives together with his girlfriend, apparently not healthy relationship per patient.  He is already on Wellbutrin, Lexapro, we talk about possibly referring him to psychiatry or counseling, he declined. Preventive care: Recently had COVID booster. RTC 3 months  This visit occurred during the SARS-CoV-2 public health emergency.  Safety protocols were in place, including screening questions prior to the visit, additional usage of staff PPE, and extensive cleaning of exam room while observing appropriate contact time as indicated for disinfecting solutions.

## 2020-08-05 DIAGNOSIS — N401 Enlarged prostate with lower urinary tract symptoms: Secondary | ICD-10-CM | POA: Insufficient documentation

## 2020-08-05 DIAGNOSIS — N400 Enlarged prostate without lower urinary tract symptoms: Secondary | ICD-10-CM | POA: Insufficient documentation

## 2020-08-05 LAB — URINE CULTURE
MICRO NUMBER:: 11524764
SPECIMEN QUALITY:: ADEQUATE

## 2020-08-05 LAB — URINALYSIS, ROUTINE W REFLEX MICROSCOPIC
Bacteria, UA: NONE SEEN /HPF
Bilirubin Urine: NEGATIVE
Glucose, UA: NEGATIVE
Hyaline Cast: NONE SEEN /LPF
Leukocytes,Ua: NEGATIVE
Nitrite: NEGATIVE
Specific Gravity, Urine: 1.022 (ref 1.001–1.03)
WBC, UA: NONE SEEN /HPF (ref 0–5)
pH: <=5 (ref 5.0–8.0)

## 2020-08-05 NOTE — Assessment & Plan Note (Signed)
Leg cellulitis: see last OV.Resolved.  Pretibial areas remain slightly pink but not TTP or warm HTN:On lisinopril, metoprolol.  BP slightly elevated today, pulse 81, increase metoprolol to 150 mg.  Monitor BPs Neuropathy: Saw neurology 07/04/2020, they rec: - neuropathy labs (not done, encouraged to proceed) - MRI lumbar spine: showed worsening DJD changes compared to 2010, Rx conservative mngmt but consider surgery BPH, LUTS, elevated PSA, h/o microscopic hematuria  Saw urology 06/06/2020, DRE Nontender gland, no nodules, urinalysis: 9-15 RBCs, was Rx Myrbetriq, could not afford it. Presents today with increased chronic LUTS.  UA show blood, Rx UCX, PSA . Further advised with results. OSA: Not using CPAP, again encouraged to do, risks discussed. Anxiety, depression, insomnia: Not well controlled, he lives together with his girlfriend, apparently not healthy relationship per patient.  He is already on Wellbutrin, Lexapro, we talk about possibly referring him to psychiatry or counseling, he declined. Preventive care: Recently had COVID booster. RTC 3 months

## 2020-08-06 ENCOUNTER — Telehealth: Payer: Self-pay | Admitting: *Deleted

## 2020-08-06 NOTE — Telephone Encounter (Signed)
LVM informing patient his MRI lumbar spine showed progressive degenerative changes since 2010. Dr Leta Baptist recommends he continue conservative management. He may consider spine surgery consult if he prefers. Left # for questions.

## 2020-08-07 NOTE — Telephone Encounter (Signed)
Patient called back, answered his questions to his stated satisfaction. He stated he has PT evaluation scheduled for tomorrow.  He stated that he left [facility] before getting labs drawn. I advised he can come any day we are open, no appointment needed. Patient verbalized understanding, appreciation.

## 2020-08-08 ENCOUNTER — Other Ambulatory Visit: Payer: Self-pay

## 2020-08-08 ENCOUNTER — Ambulatory Visit: Payer: Medicare Other | Attending: Diagnostic Neuroimaging | Admitting: Physical Therapy

## 2020-08-08 DIAGNOSIS — R262 Difficulty in walking, not elsewhere classified: Secondary | ICD-10-CM | POA: Insufficient documentation

## 2020-08-08 DIAGNOSIS — R2681 Unsteadiness on feet: Secondary | ICD-10-CM | POA: Insufficient documentation

## 2020-08-08 DIAGNOSIS — G8929 Other chronic pain: Secondary | ICD-10-CM | POA: Insufficient documentation

## 2020-08-08 DIAGNOSIS — M25572 Pain in left ankle and joints of left foot: Secondary | ICD-10-CM | POA: Diagnosis not present

## 2020-08-08 DIAGNOSIS — M25571 Pain in right ankle and joints of right foot: Secondary | ICD-10-CM | POA: Diagnosis not present

## 2020-08-08 DIAGNOSIS — M545 Low back pain, unspecified: Secondary | ICD-10-CM | POA: Diagnosis not present

## 2020-08-08 MED ORDER — AMOXICILLIN 875 MG PO TABS: 875.0000 mg | ORAL_TABLET | Freq: Two times a day (BID) | ORAL | 0 refills | Status: DC

## 2020-08-08 NOTE — Therapy (Signed)
[facility] [address]  [place], [place], [address] Phone: [phone]   Fax:  [phone]  Physical Therapy Evaluation  Patient Details  Name: Robert Gibbs MRN: [id number] Date of Birth: October 21, 1950 Referring Provider (PT): Penumalli   Encounter Date: 08/08/2020   PT End of Session - 08/08/20 1444    Visit Number 1    Date for PT Re-Evaluation 10/06/20    Authorization Type [employer]    PT Start Time 1355    PT Stop Time 1445    PT Time Calculation (min) 50 min    Activity Tolerance Patient tolerated treatment well    Behavior During Therapy [facility] for tasks assessed/performed           Past Medical History:  Diagnosis Date  . Anxiety and depression   . Arthritis   . Barrett esophagus   . BPH (benign prostatic hyperplasia)   . CTS (carpal tunnel syndrome)    h/o  . Edema of both legs    legs , ankles and feet  . Erectile dysfunction   . GERD (gastroesophageal reflux disease) 03/18/2012  . H/O hiatal hernia   . HTN (hypertension)   . Hyperlipidemia   . Microscopic hematuria   . Neuropathy   . OSA (obstructive sleep apnea) 03/18/2012   on BiPap  . Prostatitis   . PVD (peripheral vascular disease) ([place])   . Spinal stenosis 06-2012   saw neuro w/ "neuropathy", dx w/ spinal stenosis  . Varicose veins   . Vitamin D deficiency   . Wears glasses     Past Surgical History:  Procedure Laterality Date  . ARCH BAR REMOVAL    . BACK SURGERY  2002   L4-L5  . [place]   for tennis elbow-rt  . ESOPHAGOGASTRODUODENOSCOPY  04/2015   w/ biopsy, short tongue of Barrett's without obvious nodularity, mass or esophagitis seen  . EYE SURGERY Right    cataract with lens implant  . FOOT SURGERY     R, for plantar fasciitis  . HERNIA REPAIR     x 2 as a child-ing   . KNEE ARTHROSCOPY WITH MENISCAL REPAIR Bilateral 05/04/2013   Procedure: BILATERAL KNEE ARTHROSCOPY WITH POSSIBLE CHONDROPLASTY AND  MENISCECTOMY ;  Surgeon: Kerin Salen, MD;  Location: [place];  Service: Orthopedics;  Laterality: Bilateral;  Right chrondroplasty, debridement, , partial medial menisectomy, removal of loose bodies.   Marland Kitchen LIGATION / DIVISION SAPHENOUS VEIN     both legs  . PROSTATE BIOPSY     2010, (-)  . TONSILLECTOMY    . TOTAL KNEE ARTHROPLASTY Right 11/07/2013   Procedure: TOTAL KNEE ARTHROPLASTY;  Surgeon: Kerin Salen, MD;  Location: [place];  Service: Orthopedics;  Laterality: Right;  . TOTAL KNEE ARTHROPLASTY Left 03/01/2014   Procedure: TOTAL KNEE ARTHROPLASTY;  Surgeon: Kerin Salen, MD;  Location: [place];  Service: Orthopedics;  Laterality: Left;  . UPPER GASTROINTESTINAL ENDOSCOPY    . UVULOPALATOPHARYNGOPLASTY (UPPP)/TONSILLECTOMY/SEPTOPLASTY  1990    There were no vitals filed for this visit.    Subjective Assessment - 08/08/20 1400    Subjective 70 year old male here for evaluation of neuropathy.  Patient has had pain and numbness in feet and ankles since 2000.  Symptoms have progressed over time.  Initially she saw a foot doctor, was diagnosed with plantar fasciitis and underwent surgery which did not help.  He is also had significant low back pain, numbness,  tingling, burning, stabbing sensations in his toes feet and ankles up to his shins.  Symptoms have progressed over time.  Also had knee surgeries in 2019.  Also had low back surgery in 1990, with follow-up MRI scan in 2010 showing lumbar spinal stenosis.  He did not pursue further treatments for his low back since that time.  Patient reports pain in the back at times, reports that he has N/T, shooting pain in the lower legs.  Recent MRI showed stenosis, DDD and spondylosis.  He does report a recent hospitalization due cellutlitis and sepsis.    Limitations Lifting;Standing;Walking;House hold activities    How long can you stand comfortably? 20 minutes max    How long can you walk comfortably? 2 blocks max    Patient Stated  Goals move better, better tolerance to activity, less pain and swelling    Currently in Pain? Yes    Pain Location Back   bilateral feet   Pain Orientation Lower    Pain Descriptors / Indicators Aching;Numbness;Tingling;Shooting;Sharp;Jabbing    Pain Type Chronic pain    Pain Radiating Towards has a lot of numbness and tingling from the knee down the shins, then plantar and dorsum of the feet    Pain Onset More than a month ago    Pain Frequency Intermittent    Aggravating Factors  walking and standing, activity pain up to 9/10 in the feet and ankles numbenss and tingling, pain in the back up to 7/10    Pain Relieving Factors elevate the feet and legs, sit and treat, pain can go down to a 2/10., reports always has sensations in the top fo th efeet    Effect of Pain on Daily Activities limits everything              [facility] PT Assessment - 08/08/20 0001      Assessment   Medical Diagnosis LBP, stenosis and bilateral foot pain with neuropathy    Referring Provider (PT) Penumalli    Onset Date/Surgical Date 07/25/20    Prior Therapy no      Precautions   Precautions None      Balance Screen   Has the patient fallen in the past 6 months Yes    How many times? 4    Has the patient had a decrease in activity level because of a fear of falling?  Yes    Is the patient reluctant to leave their home because of a fear of falling?  No      Home Environment   Additional Comments has stairs at home can do step over step most of the time, reports limited housework      Prior Function   Level of Independence Independent    Vocation Retired    Leisure wants to be active, not doing anything now      Mining engineer Comments fwd head, rounded posture      ROM / Strength   AROM / PROM / Strength AROM;Strength      AROM   Overall AROM Comments lumbar ROM decreased 25% for flexion, decreased 50% other motions      Strength   Strength Assessment Site Hip;Knee;Ankle     Right/Left Hip Right;Left    Right Hip Flexion 4-/5    Right Hip ABduction 4/5    Left Hip Flexion 4-/5    Left Hip ABduction 4/5    Right/Left Knee Right;Left    Right Knee Flexion 4+/5  Right Knee Extension 4/5    Left Knee Flexion 4/5    Left Knee Extension 4-/5    Right/Left Ankle Right;Left    Right Ankle Dorsiflexion 4-/5    Right Ankle Plantar Flexion 4-/5    Right Ankle Inversion 4-/5    Right Ankle Eversion 4-/5    Left Ankle Dorsiflexion 4-/5    Left Ankle Plantar Flexion 4-/5    Left Ankle Inversion 3+/5    Left Ankle Eversion 3+/5      Ambulation/Gait   Gait Comments no device, slow, wide BOS, seems to have a little ataxic motions but is most likely due to not feeling the feet, reports his feet feel like narrow bricks      Standardized Balance Assessment   Standardized Balance Assessment Timed Up and Go Test;Berg Balance Test      Berg Balance Test   Sit to Stand Able to stand without using hands and stabilize independently    Standing Unsupported Able to stand safely 2 minutes    Sitting with Back Unsupported but Feet Supported on Floor or Stool Able to sit safely and securely 2 minutes    Stand to Sit Sits safely with minimal use of hands    Transfers Able to transfer safely, minor use of hands    Standing Unsupported with Eyes Closed Able to stand 10 seconds with supervision    Standing Unsupported with Feet Together Able to place feet together independently but unable to hold for 30 seconds    From Standing, Reach Forward with Outstretched Arm Can reach forward >12 cm safely (5")    From Standing Position, Pick up Object from Floor Able to pick up shoe, needs supervision    From Standing Position, Turn to Look Behind Over each Shoulder Turn sideways only but maintains balance    Turn 360 Degrees Able to turn 360 degrees safely one side only in 4 seconds or less    Standing Unsupported, Alternately Place Feet on Step/Stool Able to stand independently and  complete 8 steps >20 seconds    Standing Unsupported, One Foot in Front Needs help to step but can hold 15 seconds    Standing on One Leg Unable to try or needs assist to prevent fall    Total Score 40      Timed Up and Go Test   Normal TUG (seconds) 18                      Objective measurements completed on examination: See above findings.               PT Education - 08/08/20 1443    Education Details Wms Flexion exercises    Person(s) Educated Patient    Methods Explanation;Demonstration;Handout    Comprehension Verbalized understanding            PT Short Term Goals - 08/08/20 1531      PT SHORT TERM GOAL #1   Title independent with initial HEP    Time 2    Period Weeks    Status New             PT Long Term Goals - 08/08/20 1531      PT LONG TERM GOAL #1   Title understand posture and body mechanics instruction    Time 8    Period Weeks    Status New      PT LONG TERM GOAL #2   Title increase Oceanographer  score to 47/56    Time 8    Period Weeks    Status New      PT LONG TERM GOAL #3   Title decrease TUG time to 13 seconds    Time 8    Period Weeks    Status New      PT LONG TERM GOAL #4   Title report pain in the low back decreased 25%    Time 8    Period Weeks    Status New      PT LONG TERM GOAL #5   Title independent and safe with an advanced HEP (he has a total gym at home)    Time 8    Period Weeks    Status New                  Plan - 08/08/20 1445    Clinical Impression Statement Patient has dx of bilateral foot pain due to neuropathy, he has LBP with stenosis, DDD and spondylosis.  He reports that he has had some issues for a number of years, reports multiple falls over the past year due to feet "issues".  He has some pain in the low back, he has some weakness of the LE's, his balance was the thing that seemed to give him the most trouble, Berg blance test score was 40/56 and the TUG was 18 seconds,  both put him at a higher risk for falls.  He does not use a device, he walks with a wide BOS, seems to have decreased control of the knees and feet, reports that he feels like his feet feel like small bricks    Personal Factors and Comorbidities Comorbidity 3+    Comorbidities anxiety, depression, BPH, HTN, past back surgery, GERD, PVD    Stability/Clinical Decision Making Evolving/Moderate complexity    Clinical Decision Making Moderate    Rehab Potential Good    PT Frequency 2x / week    PT Duration 8 weeks    PT Treatment/Interventions ADLs/Self Care Home Management;Electrical Stimulation;Moist Heat;Gait training;Neuromuscular re-education;Balance training;Therapeutic exercise;Therapeutic activities;Functional mobility training;Stair training;Patient/family education;Manual techniques    PT Next Visit Plan slowly work on core stability, balance and safety    Consulted and Agree with Plan of Care Patient           Patient will benefit from skilled therapeutic intervention in order to improve the following deficits and impairments:  Abnormal gait,Decreased range of motion,Difficulty walking,Decreased endurance,Cardiopulmonary status limiting activity,Decreased activity tolerance,Pain,Improper body mechanics,Impaired flexibility,Decreased balance,Decreased mobility,Decreased strength  Visit Diagnosis: Chronic bilateral low back pain without sciatica - Plan: PT plan of care cert/re-cert  Difficulty in walking, not elsewhere classified - Plan: PT plan of care cert/re-cert  Unsteadiness on feet - Plan: PT plan of care cert/re-cert  Pain in left ankle and joints of left foot - Plan: PT plan of care cert/re-cert  Pain in right ankle and joints of right foot - Plan: PT plan of care cert/re-cert     Problem List Patient Active Problem List   Diagnosis Date Noted  . BPH (benign prostatic hyperplasia) 08/05/2020  . Morbid obesity ([place]) 08/03/2019  . Insomnia 06/03/2018  . PCP NOTES  >>>>>>> 06/13/2015  . Diastasis recti 02/01/2015  . Unspecified constipation 11/11/2013  . DJD --s/p B TKR 2015 11/06/2013  . Leg edema 11/04/2013  . Annual physical exam 11/16/2012  . Spinal stenosis (sx= paresthesias, feet ankle) 06/08/2012  . Varicose veins  06/08/2012  . Hyperlipidemia 06/08/2012  .  Erectile dysfunction 06/08/2012  . HTN (hypertension) 03/18/2012  . OSA -- on BiPAP  - sees pulmonary (HP) 03/18/2012  . GERD (gastroesophageal reflux disease) 03/18/2012  . Hyperglycemia 03/18/2012  . Anxiety and depression 03/18/2012  . Vitamin D deficiency 03/18/2012    Sumner Boast., PT 08/08/2020, 3:40 PM  [facility] [address]  [place], [place], [address] Phone: [phone]   Fax:  [phone]  Name: Robert Gibbs MRN: [id number] Date of Birth: February 10, 1951

## 2020-08-08 NOTE — Addendum Note (Signed)
Addended byDamita Dunnings D on: 08/08/2020 09:08 AM   Modules accepted: Orders

## 2020-08-15 ENCOUNTER — Encounter: Payer: Self-pay | Admitting: Physical Therapy

## 2020-08-15 ENCOUNTER — Other Ambulatory Visit: Payer: Self-pay

## 2020-08-15 ENCOUNTER — Ambulatory Visit: Payer: Medicare Other | Admitting: Physical Therapy

## 2020-08-15 DIAGNOSIS — G8929 Other chronic pain: Secondary | ICD-10-CM | POA: Diagnosis not present

## 2020-08-15 DIAGNOSIS — R262 Difficulty in walking, not elsewhere classified: Secondary | ICD-10-CM | POA: Diagnosis not present

## 2020-08-15 DIAGNOSIS — M545 Low back pain, unspecified: Secondary | ICD-10-CM

## 2020-08-15 DIAGNOSIS — M25571 Pain in right ankle and joints of right foot: Secondary | ICD-10-CM

## 2020-08-15 DIAGNOSIS — M25572 Pain in left ankle and joints of left foot: Secondary | ICD-10-CM

## 2020-08-15 DIAGNOSIS — R2681 Unsteadiness on feet: Secondary | ICD-10-CM

## 2020-08-15 NOTE — Therapy (Addendum)
[facility] [address]  [place], [place], [address] Phone: [phone]   Fax:  [phone]  Physical Therapy Treatment  Patient Details  Name: Robert Gibbs MRN: [id number] Date of Birth: [birth year] Referring Provider (PT): Penumalli   Progress Note Reporting Period 08/08/20 to 08/15/20  See note below for Objective Data and Assessment of Progress/Goals.     Encounter Date: 08/15/2020   PT End of Session - 08/15/20 1737    Visit Number 2    Date for PT Re-Evaluation 10/06/20    Authorization Type [employer]    PT Start Time 1650    PT Stop Time 1735    PT Time Calculation (min) 45 min    Activity Tolerance Patient tolerated treatment well;Patient limited by pain    Behavior During Therapy [facility] for tasks assessed/performed           Past Medical History:  Diagnosis Date  . Anxiety and depression   . Arthritis   . Barrett esophagus   . BPH (benign prostatic hyperplasia)   . CTS (carpal tunnel syndrome)    h/o  . Edema of both legs    legs , ankles and feet  . Erectile dysfunction   . GERD (gastroesophageal reflux disease) 03/18/2012  . H/O hiatal hernia   . HTN (hypertension)   . Hyperlipidemia   . Microscopic hematuria   . Neuropathy   . OSA (obstructive sleep apnea) 03/18/2012   on BiPap  . Prostatitis   . PVD (peripheral vascular disease) ([place])   . Spinal stenosis 06-2012   saw neuro w/ "neuropathy", dx w/ spinal stenosis  . Varicose veins   . Vitamin D deficiency   . Wears glasses     Past Surgical History:  Procedure Laterality Date  . ARCH BAR REMOVAL    . BACK SURGERY  2002   L4-L5  . [place]   for tennis elbow-rt  . ESOPHAGOGASTRODUODENOSCOPY  04/2015   w/ biopsy, short tongue of Barrett's without obvious nodularity, mass or esophagitis seen  . EYE SURGERY Right    cataract with lens implant  . FOOT SURGERY     R, for plantar fasciitis  . HERNIA REPAIR     x 2  as a child-ing   . KNEE ARTHROSCOPY WITH MENISCAL REPAIR Bilateral 05/04/2013   Procedure: BILATERAL KNEE ARTHROSCOPY WITH POSSIBLE CHONDROPLASTY AND MENISCECTOMY ;  Surgeon: Kerin Salen, MD;  Location: [facility];  Service: Orthopedics;  Laterality: Bilateral;  Right chrondroplasty, debridement, , partial medial menisectomy, removal of loose bodies.   Marland Kitchen LIGATION / DIVISION SAPHENOUS VEIN     both legs  . PROSTATE BIOPSY     2010, (-)  . TONSILLECTOMY    . TOTAL KNEE ARTHROPLASTY Right 11/07/2013   Procedure: TOTAL KNEE ARTHROPLASTY;  Surgeon: Kerin Salen, MD;  Location: [facility];  Service: Orthopedics;  Laterality: Right;  . TOTAL KNEE ARTHROPLASTY Left 03/01/2014   Procedure: TOTAL KNEE ARTHROPLASTY;  Surgeon: Kerin Salen, MD;  Location: [facility];  Service: Orthopedics;  Laterality: Left;  . UPPER GASTROINTESTINAL ENDOSCOPY    . UVULOPALATOPHARYNGOPLASTY (UPPP)/TONSILLECTOMY/SEPTOPLASTY  1990    There were no vitals filed for this visit.   Subjective Assessment - 08/15/20 1652    Subjective Reports that he woke up 2 days ago with increased pain in the R LB. HEP is "not going very good because of this back."    Pertinent History spinal  stenosis, PVD, neuropathy, HLD, HTN, hiatal hernia, GERD, CTS, anxiety and depression, L4/5 back surgery 2002, R foot surgery, B TKA    Patient Stated Goals move better, better tolerance to activity, less pain and swelling    Currently in Pain? Yes    Pain Score 7    6.5   Pain Location Back    Pain Orientation Right;Lower    Pain Descriptors / Indicators Dull    Pain Type Chronic pain                             [facility] Adult PT Treatment/Exercise - 08/15/20 0001      Ambulation/Gait   Ambulation Distance (Feet) 120 Feet    Assistive device Straight cane    Gait Pattern Step-through pattern;Trunk flexed;Abducted- right;Abducted - left;Wide base of support    Ambulation Surface Level;Indoor    Gait Comments improved  stability with SPC and quick to catch on to [facility] sequencing      Exercises   Exercises Knee/Hip;Lumbar      Lumbar Exercises: Stretches   Single Knee to Chest Stretch Left;Right;1 rep;30 seconds    Single Knee to Chest Stretch Limitations with strap assist and gentle OP    Double Knee to Chest Stretch 1 rep    Double Knee to Chest Stretch Limitations asked patient to demonstrate- was performing supine reverse cruch which caused pain in the LB- discontoinued    Pelvic Tilt 10 reps;5 seconds    Pelvic Tilt Limitations posterior pelvic tilt   difficulty attaining proper movement pattern- better with manual cues and demo; visible diastasis recti without pain   Other Lumbar Stretch Exercise forward prayer stretch 5x5" to R and L sided 5x5"    Other Lumbar Stretch Exercise overhead medball lift in hooklying x10   cues to avoid valsalva and encouraging rhythmic breathing;     Lumbar Exercises: Aerobic   Nustep L3 x 6 min (UEs/LEs)      Lumbar Exercises: Seated   Other Seated Lumbar Exercises sitting anterior/posterior pelvic tilt x15   manual cues to encourage proper movement pattern     Knee/Hip Exercises: Standing   Hip Abduction Stengthening;Right;Left;1 set;10 reps;Knee straight    Abduction Limitations at TM rail   cues to avoid lateral trunk lean     Knee/Hip Exercises: Seated   Sit to Sand 1 set;15 reps;without UE support   good speed/control                 PT Education - 08/15/20 1736    Education Details update to HEP- advised patient to perform exercises to tolerance; advised to purchase a SPC for improved safety with ambulation    Person(s) Educated Patient    Methods Explanation;Demonstration;Tactile cues;Verbal cues;Handout    Comprehension Verbalized understanding;Returned demonstration            PT Short Term Goals - 08/15/20 1740      PT SHORT TERM GOAL #1   Title independent with initial HEP    Time 2    Period Weeks    Status On-going              PT Long Term Goals - 08/15/20 1740      PT LONG TERM GOAL #1   Title understand posture and body mechanics instruction    Time 8    Period Weeks    Status On-going      PT LONG TERM GOAL #2  Title increase Berg Balance score to 47/56    Time 8    Period Weeks    Status On-going      PT LONG TERM GOAL #3   Title decrease TUG time to 13 seconds    Time 8    Period Weeks    Status On-going      PT LONG TERM GOAL #4   Title report pain in the low back decreased 25%    Time 8    Period Weeks    Status On-going      PT LONG TERM GOAL #5   Title independent and safe with an advanced HEP (he has a total gym at home)    Time 8    Period Weeks    Status On-going                 Plan - 08/15/20 1738    Clinical Impression Statement Patient reporting poor tolerance for HEP d/t flare up of R sided LBP 2 days ago without known cause. Initiated gait training with SPC with much improved stability and good cane sequencing- advised patient to purchase [facility] for improved safety at home- patient agreeable. Reviewed HEP to assess for tolerance and carryover- patient initially demonstrated reverse crunch when attempting to perform DKTC exercise and reported LBP. Discontinued this, and instead trialed [facility] with strap assisted stretch with better tolerance and comfort. Worked on encouraging posterior pelvic tilting, which patient had trouble coordinating. Slightly better success in sitting. Patient reported good improvement in LBP with gentle prayer stretch. Worked on standing hip strengthening with cueing to avoid lateral trunk lean as compensation. HEP was updated with today's exercises that were well-tolerated. Patient reported understanding and without complaints at end of session.    Comorbidities anxiety, depression, BPH, HTN, past back surgery, GERD, PVD    PT Treatment/Interventions ADLs/Self Care Home Management;Electrical Stimulation;Moist Heat;Gait training;Neuromuscular  re-education;Balance training;Therapeutic exercise;Therapeutic activities;Functional mobility training;Stair training;Patient/family education;Manual techniques    PT Next Visit Plan slowly work on core stability, balance and safety    Consulted and Agree with Plan of Care Patient           Patient will benefit from skilled therapeutic intervention in order to improve the following deficits and impairments:  Abnormal gait,Decreased range of motion,Difficulty walking,Decreased endurance,Cardiopulmonary status limiting activity,Decreased activity tolerance,Pain,Improper body mechanics,Impaired flexibility,Decreased balance,Decreased mobility,Decreased strength  Visit Diagnosis: Chronic bilateral low back pain without sciatica  Difficulty in walking, not elsewhere classified  Unsteadiness on feet  Pain in left ankle and joints of left foot  Pain in right ankle and joints of right foot     Problem List Patient Active Problem List   Diagnosis Date Noted  . BPH (benign prostatic hyperplasia) 08/05/2020  . Morbid obesity ([place]) 08/03/2019  . Insomnia 06/03/2018  . PCP NOTES >>>>>>> 06/13/2015  . Diastasis recti 02/01/2015  . Unspecified constipation 11/11/2013  . DJD --s/p B TKR 2015 11/06/2013  . Leg edema 11/04/2013  . Annual physical exam 11/16/2012  . Spinal stenosis (sx= paresthesias, feet ankle) 06/08/2012  . Varicose veins  06/08/2012  . Hyperlipidemia 06/08/2012  . Erectile dysfunction 06/08/2012  . HTN (hypertension) 03/18/2012  . OSA -- on BiPAP  - sees pulmonary (HP) 03/18/2012  . GERD (gastroesophageal reflux disease) 03/18/2012  . Hyperglycemia 03/18/2012  . Anxiety and depression 03/18/2012  . Vitamin D deficiency 03/18/2012     Janene Harvey, PT, DPT 08/15/20 5:44 PM   [facility] [address]  [place], [place], [address] Phone: [phone]   Fax:  [phone]  Name: GAELEN BRAGER MRN: [id number] Date of Birth: 07-16-1950   PHYSICAL THERAPY DISCHARGE SUMMARY  Visits from Start of Care: 2  Current functional level related to goals / functional outcomes: Unable to assess; patient did not return and is being DC'd d/t no-show policy   Remaining deficits: Unable to assess   Education / Equipment: HEP  Plan: Patient agrees to discharge.  Patient goals were not met. Patient is being discharged due to not returning since the last visit.  ?????     Janene Harvey, PT, DPT 09/06/20 1:23 PM

## 2020-08-22 ENCOUNTER — Ambulatory Visit: Payer: Medicare Other | Attending: Diagnostic Neuroimaging

## 2020-08-29 ENCOUNTER — Ambulatory Visit: Payer: Medicare Other | Admitting: Physical Therapy

## 2020-09-05 ENCOUNTER — Ambulatory Visit: Payer: Medicare Other | Admitting: Physical Therapy

## 2020-09-16 ENCOUNTER — Other Ambulatory Visit: Payer: Self-pay | Admitting: Internal Medicine

## 2020-10-09 ENCOUNTER — Other Ambulatory Visit: Payer: Self-pay | Admitting: *Deleted

## 2020-10-09 DIAGNOSIS — Z87891 Personal history of nicotine dependence: Secondary | ICD-10-CM

## 2020-10-12 ENCOUNTER — Encounter: Payer: Self-pay | Admitting: Internal Medicine

## 2020-10-12 ENCOUNTER — Other Ambulatory Visit: Payer: Self-pay

## 2020-10-12 ENCOUNTER — Ambulatory Visit (INDEPENDENT_AMBULATORY_CARE_PROVIDER_SITE_OTHER): Payer: Medicare Other | Admitting: Internal Medicine

## 2020-10-12 VITALS — BP 151/92 | HR 101 | Temp 98.0°F | Ht 70.0 in | Wt 284.0 lb

## 2020-10-12 DIAGNOSIS — R319 Hematuria, unspecified: Secondary | ICD-10-CM

## 2020-10-12 DIAGNOSIS — N390 Urinary tract infection, site not specified: Secondary | ICD-10-CM | POA: Diagnosis not present

## 2020-10-12 DIAGNOSIS — R399 Unspecified symptoms and signs involving the genitourinary system: Secondary | ICD-10-CM

## 2020-10-12 DIAGNOSIS — I10 Essential (primary) hypertension: Secondary | ICD-10-CM | POA: Diagnosis not present

## 2020-10-12 LAB — POC URINALSYSI DIPSTICK (AUTOMATED)
Glucose, UA: NEGATIVE
Leukocytes, UA: NEGATIVE
Nitrite, UA: NEGATIVE
Protein, UA: POSITIVE — AB
Spec Grav, UA: >=1.03 — AB (ref 1.010–1.025)
Urobilinogen, UA: 0.2 E.U./dL
pH, UA: 5.5 (ref 5.0–8.0)

## 2020-10-12 MED ORDER — SULFAMETHOXAZOLE-TRIMETHOPRIM 800-160 MG PO TABS: 1.0000 | ORAL_TABLET | Freq: Two times a day (BID) | ORAL | 0 refills | Status: DC

## 2020-10-12 NOTE — Progress Notes (Signed)
Subjective:    Patient ID: Robert Gibbs, male    DOB: 1950-08-20, 70 y.o.   MRN: [id number]  DOS:  10/12/2020 Type of visit - description: Acute  5 days ago started to notice a peculiar urine smell, usually that is d/t a UTI.  I ask about fever, T-max was 101.8 yesterday, had a fever 1 time only.  Denies nausea or vomiting. Did have some diarrhea No dysuria or gross hematuria. No difficulty urinating or urinary frequency    Review of Systems See above   Past Medical History:  Diagnosis Date  . Anxiety and depression   . Arthritis   . Barrett esophagus   . BPH (benign prostatic hyperplasia)   . CTS (carpal tunnel syndrome)    h/o  . Edema of both legs    legs , ankles and feet  . Erectile dysfunction   . GERD (gastroesophageal reflux disease) 03/18/2012  . H/O hiatal hernia   . HTN (hypertension)   . Hyperlipidemia   . Microscopic hematuria   . Neuropathy   . OSA (obstructive sleep apnea) 03/18/2012   on BiPap  . Prostatitis   . PVD (peripheral vascular disease) ([place])   . Spinal stenosis 06-2012   saw neuro w/ "neuropathy", dx w/ spinal stenosis  . Varicose veins   . Vitamin D deficiency   . Wears glasses     Past Surgical History:  Procedure Laterality Date  . ARCH BAR REMOVAL    . BACK SURGERY  2002   L4-L5  . [place]   for tennis elbow-rt  . ESOPHAGOGASTRODUODENOSCOPY  04/2015   w/ biopsy, short tongue of Barrett's without obvious nodularity, mass or esophagitis seen  . EYE SURGERY Right    cataract with lens implant  . FOOT SURGERY     R, for plantar fasciitis  . HERNIA REPAIR     x 2 as a child-ing   . KNEE ARTHROSCOPY WITH MENISCAL REPAIR Bilateral 05/04/2013   Procedure: BILATERAL KNEE ARTHROSCOPY WITH POSSIBLE CHONDROPLASTY AND MENISCECTOMY ;  Surgeon: Kerin Salen, MD;  Location: [facility];  Service: Orthopedics;  Laterality: Bilateral;  Right chrondroplasty, debridement, , partial medial menisectomy, removal of  loose bodies.   Marland Kitchen LIGATION / DIVISION SAPHENOUS VEIN     both legs  . PROSTATE BIOPSY     2010, (-)  . TONSILLECTOMY    . TOTAL KNEE ARTHROPLASTY Right 11/07/2013   Procedure: TOTAL KNEE ARTHROPLASTY;  Surgeon: Kerin Salen, MD;  Location: [facility];  Service: Orthopedics;  Laterality: Right;  . TOTAL KNEE ARTHROPLASTY Left 03/01/2014   Procedure: TOTAL KNEE ARTHROPLASTY;  Surgeon: Kerin Salen, MD;  Location: [facility];  Service: Orthopedics;  Laterality: Left;  . UPPER GASTROINTESTINAL ENDOSCOPY    . UVULOPALATOPHARYNGOPLASTY (UPPP)/TONSILLECTOMY/SEPTOPLASTY  1990    Allergies as of 10/12/2020      Reactions   Codeine Itching   Nsaids    Acid Reflux   Simvastatin    Mimics heart attack   Tolmetin    Acid Reflux      Medication List       Accurate as of October 12, 2020 11:59 PM. If you have any questions, ask your nurse or doctor.        STOP taking these medications   amoxicillin 875 MG tablet Commonly known as: AMOXIL Stopped by: Kathlene November, MD     TAKE these medications   albuterol 108 (90 Base) MCG/ACT inhaler Commonly known as:  Ventolin HFA Inhale 2 puffs into the lungs every 6 (six) hours as needed for wheezing or shortness of breath.   aspirin 81 MG tablet Take 81 mg by mouth daily.   buPROPion 300 MG 24 hr tablet Commonly known as: WELLBUTRIN XL Take 1 tablet (300 mg total) by mouth daily.   Calcium 1200 1200-1000 MG-UNIT Chew Chew 1 tablet by mouth daily.   Coenzyme Q10 200 MG capsule Take 200 mg by mouth daily.   escitalopram 20 MG tablet Commonly known as: LEXAPRO Take 1 tablet (20 mg total) by mouth daily.   ezetimibe 10 MG tablet Commonly known as: ZETIA Take 1 tablet (10 mg total) by mouth daily.   FLEX OMEGA BENEFITS/VIT D-3 PO Take 2 capsules by mouth daily.   gabapentin 800 MG tablet Commonly known as: NEURONTIN Take 1 tablet (800 mg total) by mouth 2 (two) times daily.   lisinopril 40 MG tablet Commonly known as: ZESTRIL Take 1 tablet  (40 mg total) by mouth daily.   magnesium oxide 400 MG tablet Commonly known as: MAG-OX Take 400 mg by mouth daily.   Melatonin 5 MG Caps Take 10 mg by mouth at bedtime. Patient stated that he is taking 10 mg daily   metoprolol succinate 100 MG 24 hr tablet Commonly known as: TOPROL-XL Take 1.5 tablets (150 mg total) by mouth daily. Take with or immediately following a meal.   multivitamin with minerals tablet Take 1 tablet by mouth daily.   omeprazole 40 MG capsule Commonly known as: PRILOSEC Take 1 capsule (40 mg total) by mouth daily.   sulfamethoxazole-trimethoprim 800-160 MG tablet Commonly known as: BACTRIM DS Take 1 tablet by mouth 2 (two) times daily. Started by: Kathlene November, MD   triamcinolone 55 MCG/ACT Aero nasal inhaler Commonly known as: NASACORT Place 2 sprays into the nose at bedtime.   vitamin C 1000 MG tablet Take 1,000 mg by mouth daily.   zolpidem 10 MG tablet Commonly known as: AMBIEN Take 0.5-1 tablets (5-10 mg total) by mouth at bedtime as needed for sleep.          Objective:   Physical Exam BP (!) 151/92 (BP Location: Left Arm, Patient Position: Sitting, Cuff Size: Large)   Pulse (!) 101   Temp 98 F (36.7 C) (Temporal)   Ht 5\' 10"  (1.778 m)   Wt 284 lb (128.8 kg)   SpO2 96%   BMI 40.75 kg/m  General:   Well developed, NAD, BMI noted.  HEENT:  Normocephalic . Face symmetric, atraumatic  Abdomen:  Not distended, soft, non-tender. No rebound or rigidity.  No CVA tenderness Skin: Not pale. Not jaundice Lower extremities: no pretibial edema bilaterally  Neurologic:  alert & oriented X3.  Speech normal, gait appropriate for age and unassisted Psych--  Cognition and judgment appear intact.  Cooperative with normal attention span and concentration.  Behavior appropriate. No anxious or depressed appearing.     Assessment       Assessment Hyperglycemia HTN Hyperlipidemia: s/e  with simvastatin, declined other statins  08-2019 Chronic lower extremity edema w/ some pretibial redness  (ER 2-17, [place] neg DVT, BNP wnl) anxiety depression, insomnia COPD: Per CT 03-2016 Morbid obesity GI: GERD, Barrett's  esophagus OSA, BiPAP, per pulmonary GU: Dr Felipa Eth  --LUTS , failed a # of medications --BPH, elevated PSA:  (-)  biopsy 2009, PSA 05/2014 2.92 --ED --Hematuria, CT, cystoscopy 2009 negative --Prostatitis, admitted 03-2020 Vitamin D deficiency-  MSK: -- Spinal stenosis : saw neurology w/  sx of paresthesias feet-ankle, sx felt to be d/t spinal stenosis not neuropathy, see OV note 08-07-2012 , used to see pain med (Dr Vira Blanco) -High Risk UDS 2016 --CTS Diastasis recti  PLAN UTI: Symptoms suspicious for UTI, U dip shows some red cells, had fever yesterday.  Physical exam is benign. He has a long history of BPH, LUTS, elevated PSA and UTIs. Plan: UA, urine culture, empiric Bactrim DS (avoid excessive sun exposure). Refer to urology, encourage patient to call. Call if not better HTN: Currently on lisinopril and metoprolol, BP today slightly elevated, no recent ambulatory BPs, encouraged to check consistently.  See AVS Recommend to come back for a routine checkup by June 2022  This visit occurred during the SARS-CoV-2 public health emergency.  Safety protocols were in place, including screening questions prior to the visit, additional usage of staff PPE, and extensive cleaning of exam room while observing appropriate contact time as indicated for disinfecting solutions.

## 2020-10-12 NOTE — Patient Instructions (Addendum)
Make an appointment for a routine visit by June 2022.    Start taking the antibiotic called Bactrim for 10 days  Drink plenty of fluids  Please call Dr. Felipa Eth office and get an appointment with him  Your blood pressure is slightly elevated today, please a start checking at home at least twice a week.  Be sure your blood pressure is between 110/65 and  145/85.  if it is consistently higher or lower, let me know   To find a good quality blood pressure cuff:  www.validatebp.org

## 2020-10-13 NOTE — Assessment & Plan Note (Signed)
UTI: Symptoms suspicious for UTI, U dip shows some red cells, had fever yesterday.  Physical exam is benign. He has a long history of BPH, LUTS, elevated PSA and UTIs. Plan: UA, urine culture, empiric Bactrim DS (avoid excessive sun exposure). Refer to urology, encourage patient to call. Call if not better HTN: Currently on lisinopril and metoprolol, BP today slightly elevated, no recent ambulatory BPs, encouraged to check consistently.  See AVS Recommend to come back for a routine checkup by June 2022

## 2020-10-14 LAB — UNLABELED: Test Ordered On Req: 6448395

## 2020-10-15 ENCOUNTER — Telehealth: Payer: Self-pay | Admitting: *Deleted

## 2020-10-15 DIAGNOSIS — R399 Unspecified symptoms and signs involving the genitourinary system: Secondary | ICD-10-CM

## 2020-10-15 NOTE — Telephone Encounter (Signed)
Noted  

## 2020-10-15 NOTE — Telephone Encounter (Signed)
Noted, thank you

## 2020-10-15 NOTE — Telephone Encounter (Signed)
I received fax from [facility] that they received urine specimens but specimen was unlabeled.  Spoke with patient and apologized for inconvenience and asked if he could return to re-collect another urine specimen. Pt voices understanding and is agreeable to return tomorrow at 2pm. Future orders placed.

## 2020-10-16 ENCOUNTER — Other Ambulatory Visit: Payer: Medicare Other

## 2020-10-16 NOTE — Telephone Encounter (Signed)
Pt was scheduled for lab appt today at 2:15.  Pt has not shown for appt.  Left detailed message to call if he is unable to make appt today.

## 2020-10-17 DIAGNOSIS — R3129 Other microscopic hematuria: Secondary | ICD-10-CM | POA: Diagnosis not present

## 2020-10-17 DIAGNOSIS — N390 Urinary tract infection, site not specified: Secondary | ICD-10-CM | POA: Diagnosis not present

## 2020-10-17 DIAGNOSIS — N401 Enlarged prostate with lower urinary tract symptoms: Secondary | ICD-10-CM | POA: Diagnosis not present

## 2020-10-17 DIAGNOSIS — N138 Other obstructive and reflux uropathy: Secondary | ICD-10-CM | POA: Diagnosis not present

## 2020-10-31 ENCOUNTER — Ambulatory Visit: Payer: Medicare Other | Admitting: Internal Medicine

## 2020-11-05 ENCOUNTER — Inpatient Hospital Stay: Admission: RE | Admit: 2020-11-05 | Payer: Medicare Other | Source: Ambulatory Visit

## 2020-11-05 ENCOUNTER — Encounter: Payer: Medicare Other | Admitting: Acute Care

## 2020-11-15 ENCOUNTER — Ambulatory Visit: Payer: Medicare Other | Admitting: Internal Medicine

## 2020-12-05 ENCOUNTER — Other Ambulatory Visit: Payer: Self-pay | Admitting: Internal Medicine

## 2021-01-07 ENCOUNTER — Other Ambulatory Visit: Payer: Self-pay | Admitting: Internal Medicine

## 2021-01-08 NOTE — Telephone Encounter (Signed)
Requesting: ambien Contract: N/A UDS: N/A Last Visit:10/15/2020 Next Visit:N/A Last Refill:05/02/2020  Please Advise

## 2021-01-09 ENCOUNTER — Encounter: Payer: Self-pay | Admitting: Internal Medicine

## 2021-01-09 NOTE — Telephone Encounter (Signed)
PDMP okay, Rx sent. Please send the patient a message, he is due for visit.

## 2021-01-09 NOTE — Telephone Encounter (Signed)
Message sent to Pt

## 2021-04-14 DIAGNOSIS — Z23 Encounter for immunization: Secondary | ICD-10-CM | POA: Diagnosis not present

## 2021-04-18 DIAGNOSIS — Z23 Encounter for immunization: Secondary | ICD-10-CM | POA: Diagnosis not present

## 2021-05-09 ENCOUNTER — Other Ambulatory Visit: Payer: Self-pay | Admitting: Internal Medicine

## 2021-05-12 ENCOUNTER — Other Ambulatory Visit: Payer: Self-pay | Admitting: Internal Medicine

## 2021-05-13 NOTE — Telephone Encounter (Signed)
Left message for patient to call back and schedule Medicare Annual Wellness Visit (AWV) in office.   If not able to come in office, please offer to do virtually or by telephone.  Left office number and my jabber [phone].  Last AWV:03/21/2016  Please schedule at anytime with Nurse Health Advisor.

## 2021-05-20 ENCOUNTER — Other Ambulatory Visit: Payer: Self-pay | Admitting: Internal Medicine

## 2021-05-24 ENCOUNTER — Ambulatory Visit: Payer: Medicare Other | Admitting: Internal Medicine

## 2021-05-29 ENCOUNTER — Ambulatory Visit (INDEPENDENT_AMBULATORY_CARE_PROVIDER_SITE_OTHER): Payer: Medicare Other | Admitting: Internal Medicine

## 2021-05-29 ENCOUNTER — Encounter: Payer: Self-pay | Admitting: Internal Medicine

## 2021-05-29 VITALS — BP 136/86 | HR 63 | Temp 98.2°F | Resp 18 | Ht 70.0 in | Wt 304.0 lb

## 2021-05-29 DIAGNOSIS — Z87891 Personal history of nicotine dependence: Secondary | ICD-10-CM | POA: Diagnosis not present

## 2021-05-29 DIAGNOSIS — E559 Vitamin D deficiency, unspecified: Secondary | ICD-10-CM

## 2021-05-29 DIAGNOSIS — F32A Depression, unspecified: Secondary | ICD-10-CM

## 2021-05-29 DIAGNOSIS — E785 Hyperlipidemia, unspecified: Secondary | ICD-10-CM

## 2021-05-29 DIAGNOSIS — I10 Essential (primary) hypertension: Secondary | ICD-10-CM

## 2021-05-29 DIAGNOSIS — R739 Hyperglycemia, unspecified: Secondary | ICD-10-CM | POA: Diagnosis not present

## 2021-05-29 DIAGNOSIS — F419 Anxiety disorder, unspecified: Secondary | ICD-10-CM

## 2021-05-29 DIAGNOSIS — Z122 Encounter for screening for malignant neoplasm of respiratory organs: Secondary | ICD-10-CM

## 2021-05-29 DIAGNOSIS — Z23 Encounter for immunization: Secondary | ICD-10-CM

## 2021-05-29 LAB — CBC WITH DIFFERENTIAL/PLATELET
Basophils Absolute: 0 10*3/uL (ref 0.0–0.1)
Basophils Relative: 0.7 % (ref 0.0–3.0)
Eosinophils Absolute: 0.2 10*3/uL (ref 0.0–0.7)
Eosinophils Relative: 3.6 % (ref 0.0–5.0)
HCT: 41.1 % (ref 39.0–52.0)
Hemoglobin: 13.9 g/dL (ref 13.0–17.0)
Lymphocytes Relative: 35.4 % (ref 12.0–46.0)
Lymphs Abs: 2 10*3/uL (ref 0.7–4.0)
MCHC: 33.9 g/dL (ref 30.0–36.0)
MCV: 99.7 fl (ref 78.0–100.0)
Monocytes Absolute: 0.4 10*3/uL (ref 0.1–1.0)
Monocytes Relative: 7.8 % (ref 3.0–12.0)
Neutro Abs: 2.9 10*3/uL (ref 1.4–7.7)
Neutrophils Relative %: 52.5 % (ref 43.0–77.0)
Platelets: 187 10*3/uL (ref 150.0–400.0)
RBC: 4.12 Mil/uL — ABNORMAL LOW (ref 4.22–5.81)
RDW: 14 % (ref 11.5–15.5)
WBC: 5.5 10*3/uL (ref 4.0–10.5)

## 2021-05-29 LAB — LIPID PANEL
Cholesterol: 163 mg/dL (ref 0–200)
HDL: 58.7 mg/dL (ref >39.00)
LDL Cholesterol: 92 mg/dL (ref 0–99)
NonHDL: 104.54
Total CHOL/HDL Ratio: 3
Triglycerides: 64 mg/dL (ref 0.0–149.0)
VLDL: 12.8 mg/dL (ref 0.0–40.0)

## 2021-05-29 LAB — COMPREHENSIVE METABOLIC PANEL
ALT: 15 U/L (ref 0–53)
AST: 14 U/L (ref 0–37)
Albumin: 4 g/dL (ref 3.5–5.2)
Alkaline Phosphatase: 54 U/L (ref 39–117)
BUN: 15 mg/dL (ref 6–23)
CO2: 31 mEq/L (ref 19–32)
Calcium: 8.5 mg/dL (ref 8.4–10.5)
Chloride: 103 mEq/L (ref 96–112)
Creatinine, Ser: 0.76 mg/dL (ref 0.40–1.50)
GFR: 91 mL/min (ref >60.00)
Glucose, Bld: 97 mg/dL (ref 70–99)
Potassium: 4.2 mEq/L (ref 3.5–5.1)
Sodium: 140 mEq/L (ref 135–145)
Total Bilirubin: 0.5 mg/dL (ref 0.2–1.2)
Total Protein: 6 g/dL (ref 6.0–8.3)

## 2021-05-29 LAB — TSH: TSH: 1.68 u[IU]/mL (ref 0.35–5.50)

## 2021-05-29 LAB — HEMOGLOBIN A1C: Hgb A1c MFr Bld: 5.6 % (ref 4.6–6.5)

## 2021-05-29 LAB — VITAMIN D 25 HYDROXY (VIT D DEFICIENCY, FRACTURES): VITD: 25.77 ng/mL — ABNORMAL LOW (ref 30.00–100.00)

## 2021-05-29 MED ORDER — OMEPRAZOLE 40 MG PO CPDR: 40.0000 mg | DELAYED_RELEASE_CAPSULE | Freq: Every day | ORAL | 1 refills | Status: DC

## 2021-05-29 MED ORDER — LISINOPRIL 40 MG PO TABS: 40.0000 mg | ORAL_TABLET | Freq: Every day | ORAL | 1 refills | Status: DC

## 2021-05-29 MED ORDER — EZETIMIBE 10 MG PO TABS: 10.0000 mg | ORAL_TABLET | Freq: Every day | ORAL | 1 refills | Status: DC

## 2021-05-29 MED ORDER — ESCITALOPRAM OXALATE 20 MG PO TABS: 20.0000 mg | ORAL_TABLET | Freq: Every day | ORAL | 1 refills | Status: DC

## 2021-05-29 MED ORDER — METOPROLOL SUCCINATE ER 100 MG PO TB24: 150.0000 mg | ORAL_TABLET | Freq: Every day | ORAL | 1 refills | Status: DC

## 2021-05-29 MED ORDER — ZOLPIDEM TARTRATE 10 MG PO TABS: ORAL_TABLET | ORAL | 5 refills | Status: DC

## 2021-05-29 MED ORDER — BUPROPION HCL ER (XL) 300 MG PO TB24: 300.0000 mg | ORAL_TABLET | Freq: Every day | ORAL | 1 refills | Status: DC

## 2021-05-29 NOTE — Patient Instructions (Signed)
We are referring you to have a lung cancer screening CT.  Please reach out to the wellness clinic, they can help you with weight loss  Check your blood pressure regularly  BP GOAL is between 110/65 and  135/85. If it is consistently higher or lower, let me know     GO TO THE LAB : Get the blood work     [place], [place] back for a checkup in 5 to 6 months

## 2021-05-29 NOTE — Progress Notes (Signed)
Subjective:    Patient ID: Robert Gibbs, male    DOB: [birth year], 70 y.o.   MRN: [id number]  DOS:  05/29/2021 Type of visit - description: Follow-up  Since the last office visit is doing well, has no new concerns. Trace edema at baseline Good med compliance Needs multiple refills Doing well emotionally.   Wt Readings from Last 3 Encounters:  05/29/21 (!) 304 lb (137.9 kg)  10/12/20 284 lb (128.8 kg)  08/03/20 292 lb 8 oz (132.7 kg)    Review of Systems See above   Past Medical History:  Diagnosis Date   Anxiety and depression    Arthritis    Barrett esophagus    BPH (benign prostatic hyperplasia)    CTS (carpal tunnel syndrome)    h/o   Edema of both legs    legs , ankles and feet   Erectile dysfunction    GERD (gastroesophageal reflux disease) 03/18/2012   H/O hiatal hernia    HTN (hypertension)    Hyperlipidemia    Microscopic hematuria    Neuropathy    OSA (obstructive sleep apnea) 03/18/2012   on BiPap   Prostatitis    PVD (peripheral vascular disease) ([place])    Spinal stenosis 06-2012   saw neuro w/ "neuropathy", dx w/ spinal stenosis   Varicose veins    Vitamin D deficiency    Wears glasses     Past Surgical History:  Procedure Laterality Date   ARCH BAR REMOVAL     BACK SURGERY  2002   L4-L5   ELBOW SURGERY  1992   for tennis elbow-rt   ESOPHAGOGASTRODUODENOSCOPY  04/2015   w/ biopsy, short tongue of Barrett's without obvious nodularity, mass or esophagitis seen   EYE SURGERY Right    cataract with lens implant   FOOT SURGERY     R, for plantar fasciitis   HERNIA REPAIR     x 2 as a child-ing    KNEE ARTHROSCOPY WITH MENISCAL REPAIR Bilateral 05/04/2013   Procedure: BILATERAL KNEE ARTHROSCOPY WITH POSSIBLE CHONDROPLASTY AND MENISCECTOMY ;  Surgeon: Kerin Salen, MD;  Location: [facility];  Service: Orthopedics;  Laterality: Bilateral;  Right chrondroplasty, debridement, , partial medial menisectomy, removal of loose bodies.     LIGATION / DIVISION SAPHENOUS VEIN     both legs   PROSTATE BIOPSY     2010, (-)   TONSILLECTOMY     TOTAL KNEE ARTHROPLASTY Right 11/07/2013   Procedure: TOTAL KNEE ARTHROPLASTY;  Surgeon: Kerin Salen, MD;  Location: [facility];  Service: Orthopedics;  Laterality: Right;   TOTAL KNEE ARTHROPLASTY Left 03/01/2014   Procedure: TOTAL KNEE ARTHROPLASTY;  Surgeon: Kerin Salen, MD;  Location: [facility];  Service: Orthopedics;  Laterality: Left;   UPPER GASTROINTESTINAL ENDOSCOPY     UVULOPALATOPHARYNGOPLASTY (UPPP)/TONSILLECTOMY/SEPTOPLASTY  1990    Allergies as of 05/29/2021       Reactions   Codeine Itching   Nsaids    Acid Reflux   Simvastatin    Mimics heart attack   Tolmetin    Acid Reflux        Medication List        Accurate as of May 29, 2021 11:59 PM. If you have any questions, ask your nurse or doctor.          STOP taking these medications    sulfamethoxazole-trimethoprim 800-160 MG tablet Commonly known as: BACTRIM DS Stopped by: Kathlene November, MD       TAKE  these medications    albuterol 108 (90 Base) MCG/ACT inhaler Commonly known as: Ventolin HFA Inhale 2 puffs into the lungs every 6 (six) hours as needed for wheezing or shortness of breath.   aspirin 81 MG tablet Take 81 mg by mouth daily.   buPROPion 300 MG 24 hr tablet Commonly known as: WELLBUTRIN XL Take 1 tablet (300 mg total) by mouth daily.   Calcium 1200 1200-1000 MG-UNIT Chew Chew 1 tablet by mouth daily.   Coenzyme Q10 200 MG capsule Take 200 mg by mouth daily.   escitalopram 20 MG tablet Commonly known as: LEXAPRO Take 1 tablet (20 mg total) by mouth daily.   ezetimibe 10 MG tablet Commonly known as: ZETIA Take 1 tablet (10 mg total) by mouth daily.   FLEX OMEGA BENEFITS/VIT D-3 PO Take 2 capsules by mouth daily.   gabapentin 800 MG tablet Commonly known as: NEURONTIN Take 1 tablet (800 mg total) by mouth 2 (two) times daily.   lisinopril 40 MG tablet Commonly known as:  ZESTRIL Take 1 tablet (40 mg total) by mouth daily.   magnesium oxide 400 MG tablet Commonly known as: MAG-OX Take 400 mg by mouth daily.   Melatonin 5 MG Caps Take 10 mg by mouth at bedtime. Patient stated that he is taking 10 mg daily   metoprolol succinate 100 MG 24 hr tablet Commonly known as: TOPROL-XL Take 1.5 tablets (150 mg total) by mouth daily. Take with or immediately following a meal.   multivitamin with minerals tablet Take 1 tablet by mouth daily.   omeprazole 40 MG capsule Commonly known as: PRILOSEC Take 1 capsule (40 mg total) by mouth daily before breakfast. What changed: See the new instructions. Changed by: Kathlene November, MD   triamcinolone 55 MCG/ACT Aero nasal inhaler Commonly known as: NASACORT Place 2 sprays into the nose at bedtime.   vitamin C 1000 MG tablet Take 1,000 mg by mouth daily.   zolpidem 10 MG tablet Commonly known as: AMBIEN TAKE 1/2 TO 1 TABLET(5 TO 10 MG) BY MOUTH AT BEDTIME AS NEEDED FOR SLEEP           Objective:   Physical Exam BP 136/86 (BP Location: Left Arm, Patient Position: Sitting, Cuff Size: Normal)   Pulse 63   Temp 98.2 F (36.8 C) (Oral)   Resp 18   Ht 5\' 10"  (1.778 m)   Wt (!) 304 lb (137.9 kg)   SpO2 98%   BMI 43.62 kg/m  General: Well developed, NAD, BMI noted Neck: No  thyromegaly  HEENT:  Normocephalic . Face symmetric, atraumatic Lungs:  CTA B Normal respiratory effort, no intercostal retractions, no accessory muscle use. Heart: RRR,  no murmur.  Abdomen:  Not distended, soft, non-tender. No rebound or rigidity.  Diastasis recti noted Lower extremities: Trace pretibial edema bilaterally  Skin: Exposed areas without rash. Not pale. Not jaundice Neurologic:  alert & oriented X3.  Speech normal, gait appropriate for age and unassisted Strength symmetric and appropriate for age.  Psych: Cognition and judgment appear intact.  Cooperative with normal attention span and concentration.  Behavior  appropriate. No anxious or depressed appearing.     Assessment      Assessment Hyperglycemia HTN Hyperlipidemia: s/e  with simvastatin, declined other statins 08-2019 Chronic lower extremity edema w/ some pretibial redness  (ER 2-17, [place] neg DVT, BNP wnl) anxiety depression, insomnia COPD: Per CT 03-2016 Morbid obesity GI: GERD, Barrett's  esophagus OSA, BiPAP, per pulmonary GU: Dr Felipa Eth  --LUTS ,  failed a # of medications --BPH, elevated PSA:  (-)  biopsy 2009, PSA 05/2014 2.92 --ED --Hematuria, CT, cystoscopy 2009 negative --Prostatitis, admitted 03-2020 Vitamin D deficiency-  MSK: -- Spinal stenosis : saw neurology w/ sx of paresthesias feet-ankle, sx felt to be d/t spinal stenosis not neuropathy, see OV note 08-07-2012 , used to see pain med (Dr Vira Blanco) -High Risk UDS 2016 --CTS Diastasis recti  PLAN Hyperglycemia: Check A1c Morbid obesity: Not making any progress has actually gained weight, information about the wellness clinic provided, strongly encouraged to contact them. HTN: BP looks satisfactory today, rec  to check ambulatory BPs.  Checking labs.  Continue metoprolol, lisinopril, RF sent. Hyperlipidemia: On Zetia, previously had side effects from simva, advised to reconsider statins.  Checking lab. OSA: Good BiPAP compliance GU: Recommend to see urology regularly but currently doing well.    Anxiety depression insomnia: Refill Ambien, reports she is doing okay with Lexapro and Wellbutrin and does not desire to adjust or change medicines. ASA: Coronary calcification noted on CT, multiple CV RF, continue aspirin for now. Vitamin D deficiency: On OTCs, checking labs. Preventive care: Discussed vaccines, CT lung cancer screening-advance care planning. RTC 5 to 6 months   This visit occurred during the SARS-CoV-2 public health emergency.  Safety protocols were in place, including screening questions prior to the visit, additional usage of staff PPE, and extensive  cleaning of exam room while observing appropriate contact time as indicated for disinfecting solutions.

## 2021-05-30 MED ORDER — VITAMIN D (ERGOCALCIFEROL) 1.25 MG (50000 UNIT) PO CAPS: 50000.0000 [IU] | ORAL_CAPSULE | ORAL | 0 refills | Status: DC

## 2021-05-30 NOTE — Addendum Note (Signed)
Addended byDamita Dunnings D on: 05/30/2021 02:22 PM   Modules accepted: Orders

## 2021-05-30 NOTE — Assessment & Plan Note (Signed)
CT lung cancer screening: Referral sent Had COVID vaccination Had a Flu shot PNM 23 booster today Advance care planning package of information provided

## 2021-05-30 NOTE — Assessment & Plan Note (Addendum)
Hyperglycemia: Check A1c Morbid obesity: Not making any progress has actually gained weight, information about the wellness clinic provided, strongly encouraged to contact them. HTN: BP looks satisfactory today, rec  to check ambulatory BPs.  Checking labs.  Continue metoprolol, lisinopril, RF sent. Hyperlipidemia: On Zetia, previously had side effects from simva, advised to reconsider statins.  Checking lab. OSA: Good BiPAP compliance GU: Recommend to see urology regularly but currently doing well.    Anxiety depression insomnia: Refill Ambien, reports she is doing okay with Lexapro and Wellbutrin and does not desire to adjust or change medicines. ASA: Coronary calcification noted on CT, multiple CV RF, continue aspirin for now. Vitamin D deficiency: On OTCs, checking labs. Preventive care: Discussed vaccines, CT lung cancer screening-advance care planning. RTC 5 to 6 months

## 2021-07-09 IMAGING — DX DG HAND COMPLETE 3+V*R*
3 series · 3 of 3 positions shown · non-contrast
Comparison: None.

CLINICAL DATA: Right hand pain after fall last month.

EXAM:
RIGHT HAND - COMPLETE 3+ VIEW

[hand pa]
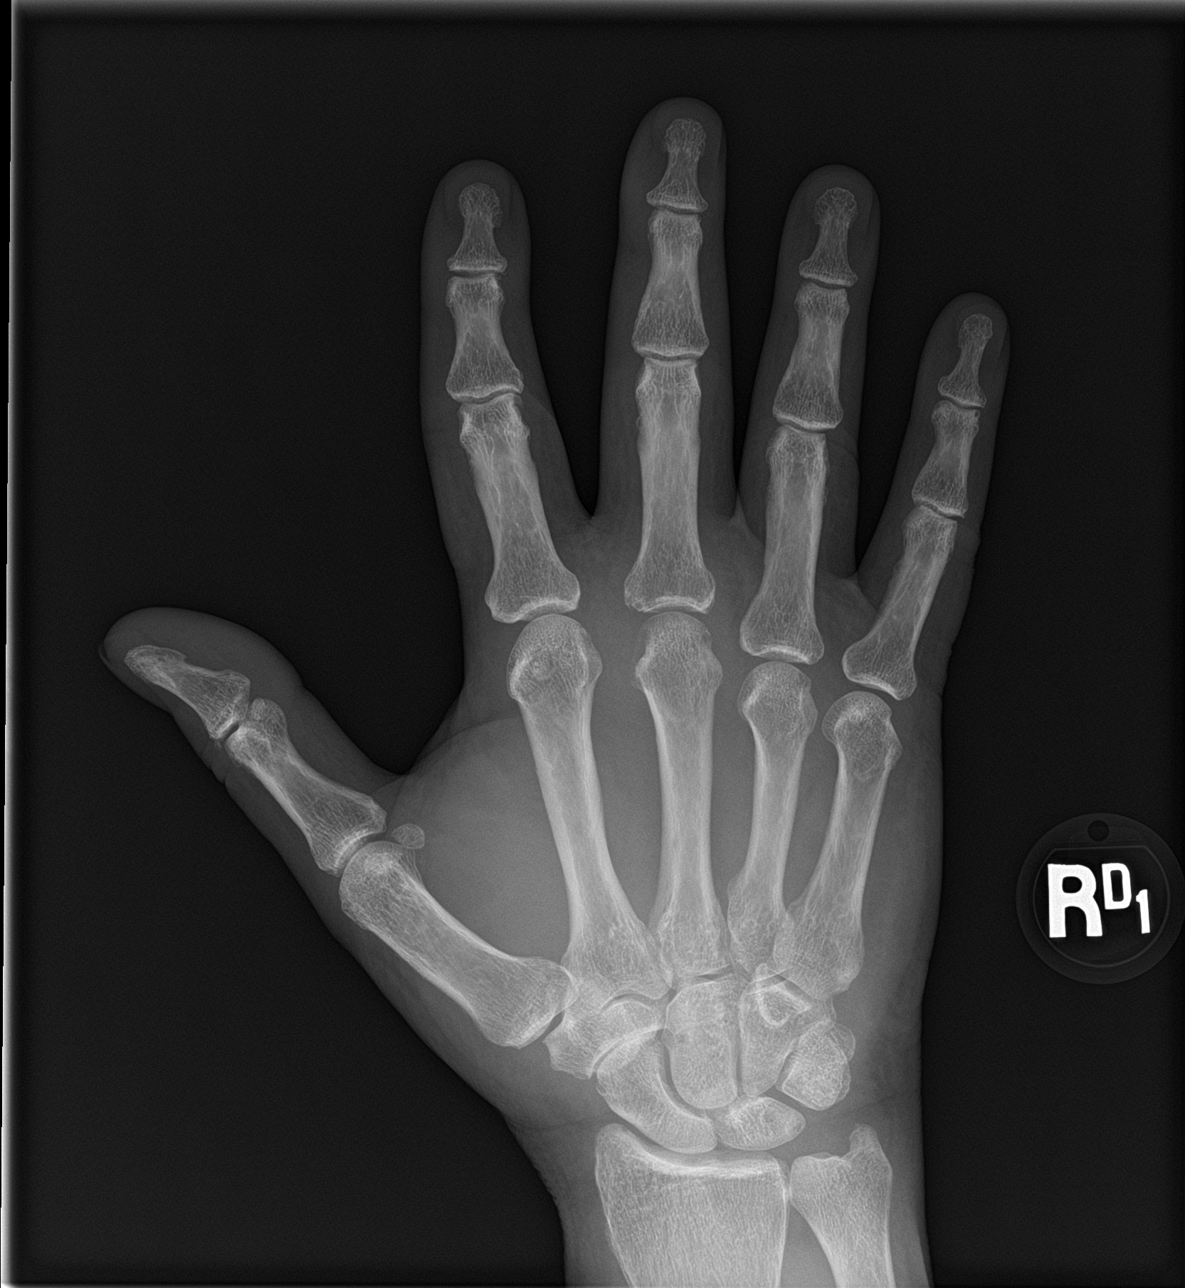

[hand obl]
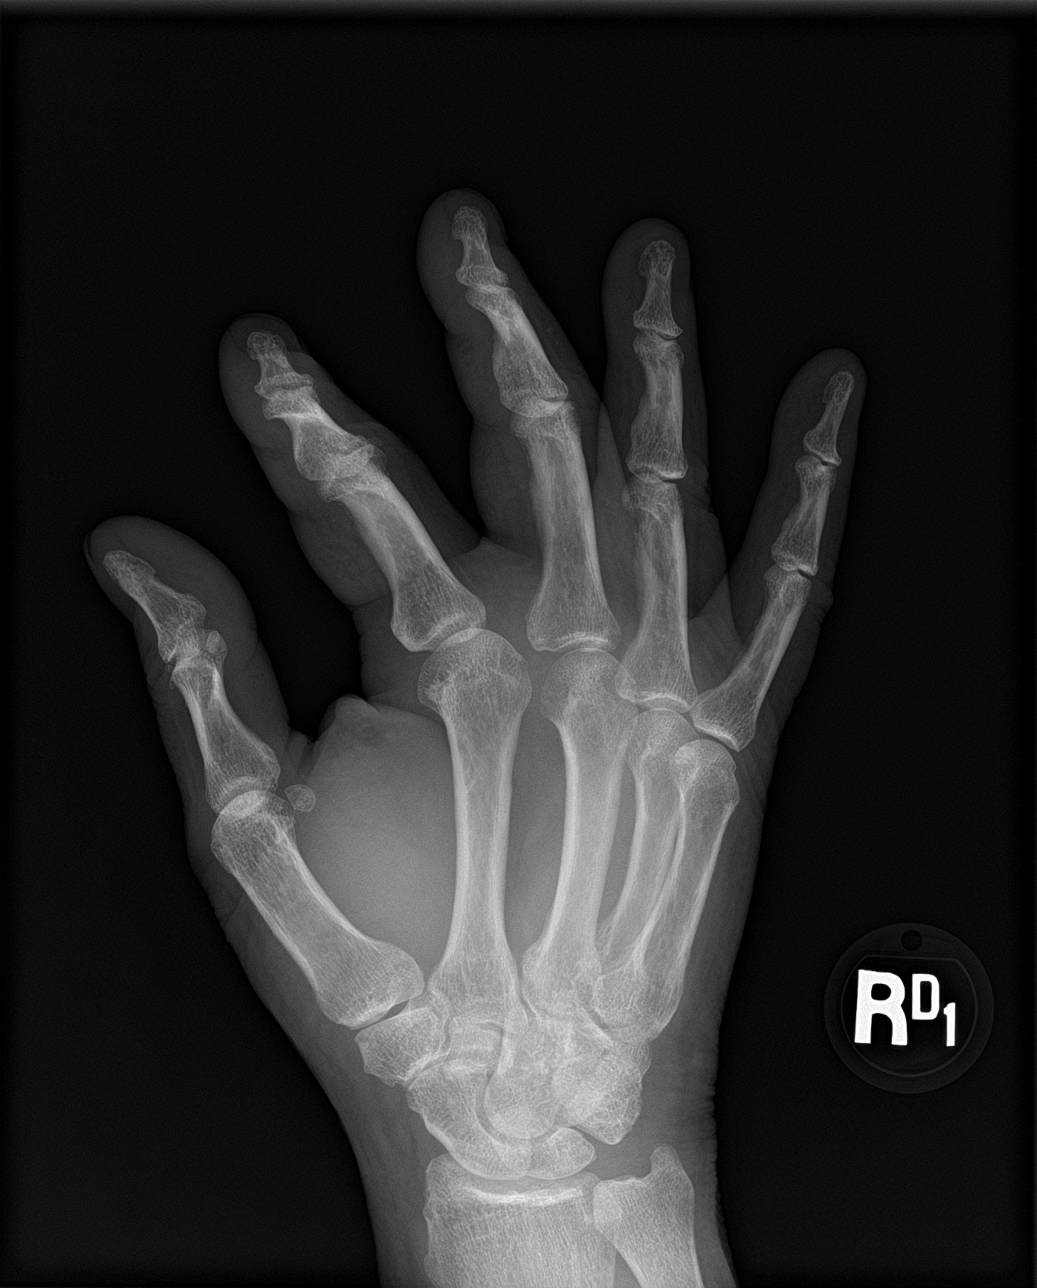

[hand lat]
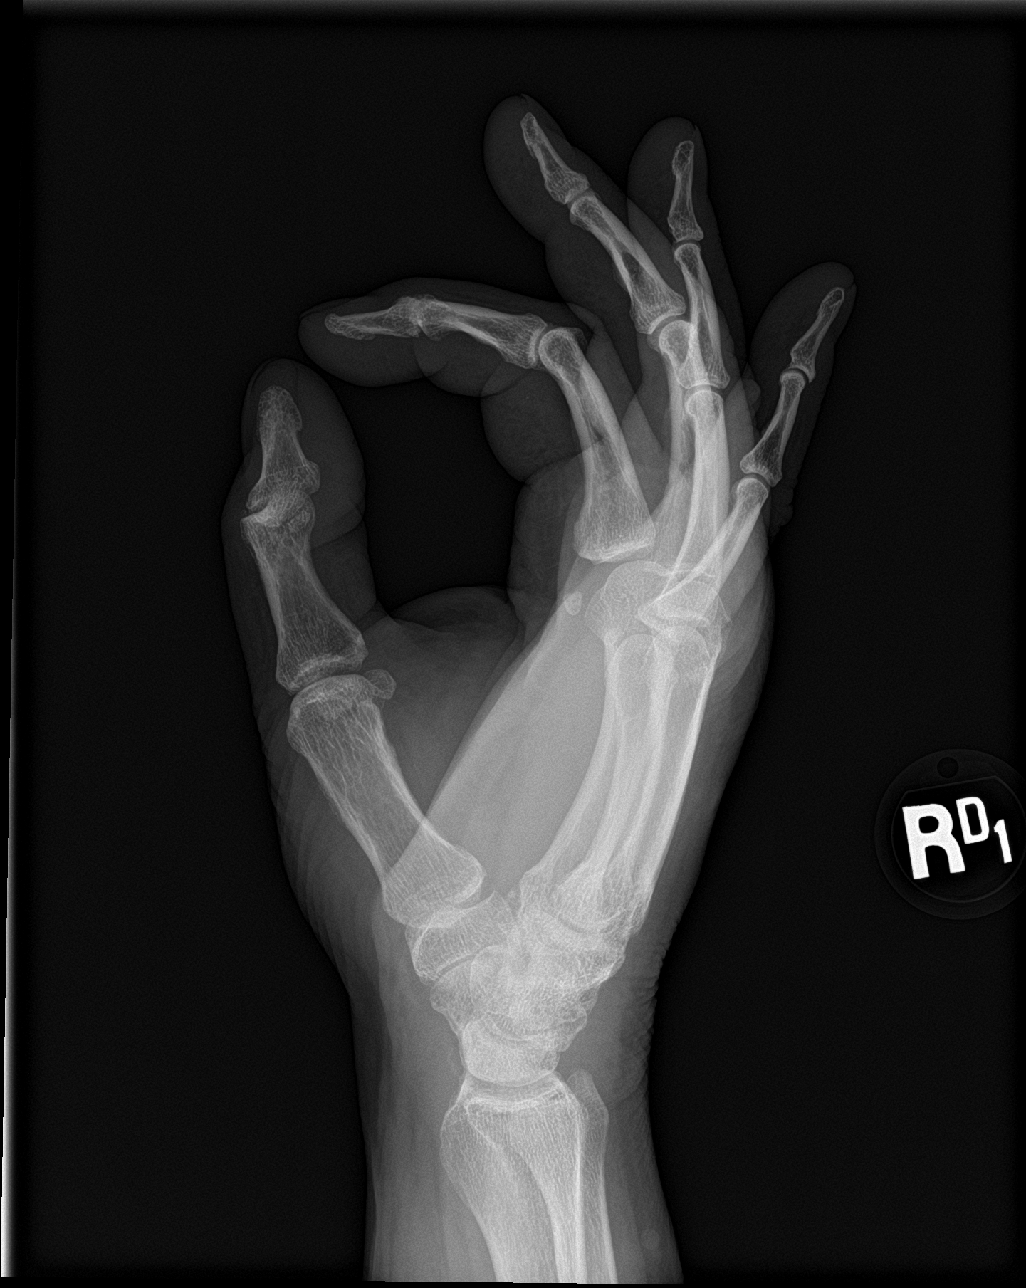

[3 of 3 positions shown; findings below may reference images not displayed]

FINDINGS: There is no evidence of fracture or dislocation. There is no
evidence of arthropathy or other focal bone abnormality. Soft
tissues are unremarkable.
IMPRESSION: Negative.

## 2021-07-29 ENCOUNTER — Other Ambulatory Visit: Payer: Self-pay | Admitting: Internal Medicine

## 2021-07-30 ENCOUNTER — Ambulatory Visit (INDEPENDENT_AMBULATORY_CARE_PROVIDER_SITE_OTHER): Payer: Medicare Other

## 2021-07-30 ENCOUNTER — Other Ambulatory Visit: Payer: Self-pay

## 2021-07-30 DIAGNOSIS — Z0000 Encounter for general adult medical examination without abnormal findings: Secondary | ICD-10-CM

## 2021-07-30 NOTE — Patient Instructions (Addendum)
Robert Gibbs , Thank you for taking time to come for your Medicare Wellness Visit. I appreciate your ongoing commitment to your health goals. Please review the following plan we discussed and let me know if I can assist you in the future.   Screening recommendations/referrals: Colonoscopy: 12/02/18 repeat very 7 years  Recommended yearly ophthalmology/optometry visit for glaucoma screening and checkup Recommended yearly dental visit for hygiene and checkup  Vaccinations: Influenza vaccine: Done 03/23/21 repeat every year  Pneumococcal vaccine: Up to date Tdap vaccine: Done 11/16/12 repeat every 10 years  Shingles vaccine: 1st dose 05/26/19   Covid-19: Completed 2/22, 09/05/19 & 2/9, 04/18/21  Advanced directives: Advance directive discussed with you today. I have provided a copy for you to complete at home and have notarized. Once this is complete please bring a copy in to our office so we can scan it into your chart.  Conditions/risks identified: Lose weight   Next appointment: Follow up in one year for your annual wellness visit.   Preventive Care 54 Years and Older, Male Preventive care refers to lifestyle choices and visits with your health care provider that can promote health and wellness. What does preventive care include? A yearly physical exam. This is also called an annual well check. Dental exams once or twice a year. Routine eye exams. Ask your health care provider how often you should have your eyes checked. Personal lifestyle choices, including: Daily care of your teeth and gums. Regular physical activity. Eating a healthy diet. Avoiding tobacco and drug use. Limiting alcohol use. Practicing safe sex. Taking low doses of aspirin every day. Taking vitamin and mineral supplements as recommended by your health care provider. What happens during an annual well check? The services and screenings done by your health care provider during your annual well check will depend on your  age, overall health, lifestyle risk factors, and family history of disease. Counseling  Your health care provider may ask you questions about your: Alcohol use. Tobacco use. Drug use. Emotional well-being. Home and relationship well-being. Sexual activity. Eating habits. History of falls. Memory and ability to understand (cognition). Work and work Statistician. Screening  You may have the following tests or measurements: Height, weight, and BMI. Blood pressure. Lipid and cholesterol levels. These may be checked every 5 years, or more frequently if you are over 55 years old. Skin check. Lung cancer screening. You may have this screening every year starting at age 40 if you have a 30-pack-year history of smoking and currently smoke or have quit within the past 15 years. Fecal occult blood test (FOBT) of the stool. You may have this test every year starting at age 26. Flexible sigmoidoscopy or colonoscopy. You may have a sigmoidoscopy every 5 years or a colonoscopy every 10 years starting at age 107. Prostate cancer screening. Recommendations will vary depending on your family history and other risks. Hepatitis C blood test. Hepatitis B blood test. Sexually transmitted disease (STD) testing. Diabetes screening. This is done by checking your blood sugar (glucose) after you have not eaten for a while (fasting). You may have this done every 1-3 years. Abdominal aortic aneurysm (AAA) screening. You may need this if you are a current or former smoker. Osteoporosis. You may be screened starting at age 40 if you are at high risk. Talk with your health care provider about your test results, treatment options, and if necessary, the need for more tests. Vaccines  Your health care provider may recommend certain vaccines, such as: Influenza vaccine.  This is recommended every year. Tetanus, diphtheria, and acellular pertussis (Tdap, Td) vaccine. You may need a Td booster every 10 years. Zoster  vaccine. You may need this after age 52. Pneumococcal 13-valent conjugate (PCV13) vaccine. One dose is recommended after age 26. Pneumococcal polysaccharide (PPSV23) vaccine. One dose is recommended after age 41. Talk to your health care provider about which screenings and vaccines you need and how often you need them. This information is not intended to replace advice given to you by your health care provider. Make sure you discuss any questions you have with your health care provider. Document Released: 07/06/2015 Document Revised: 02/27/2016 Document Reviewed: 04/10/2015 Elsevier Interactive Patient Education  2017 [place] Prevention in the Home Falls can cause injuries. They can happen to people of all ages. There are many things you can do to make your home safe and to help prevent falls. What can I do on the outside of my home? Regularly fix the edges of walkways and driveways and fix any cracks. Remove anything that might make you trip as you walk through a door, such as a raised step or threshold. Trim any bushes or trees on the path to your home. Use bright outdoor lighting. Clear any walking paths of anything that might make someone trip, such as rocks or tools. Regularly check to see if handrails are loose or broken. Make sure that both sides of any steps have handrails. Any raised decks and porches should have guardrails on the edges. Have any leaves, snow, or ice cleared regularly. Use sand or salt on walking paths during winter. Clean up any spills in your garage right away. This includes oil or grease spills. What can I do in the bathroom? Use night lights. Install grab bars by the toilet and in the tub and shower. Do not use towel bars as grab bars. Use non-skid mats or decals in the tub or shower. If you need to sit down in the shower, use a plastic, non-slip stool. Keep the floor dry. Clean up any water that spills on the floor as soon as it happens. Remove  soap buildup in the tub or shower regularly. Attach bath mats securely with double-sided non-slip rug tape. Do not have throw rugs and other things on the floor that can make you trip. What can I do in the bedroom? Use night lights. Make sure that you have a light by your bed that is easy to reach. Do not use any sheets or blankets that are too big for your bed. They should not hang down onto the floor. Have a firm chair that has side arms. You can use this for support while you get dressed. Do not have throw rugs and other things on the floor that can make you trip. What can I do in the kitchen? Clean up any spills right away. Avoid walking on wet floors. Keep items that you use a lot in easy-to-reach places. If you need to reach something above you, use a strong step stool that has a grab bar. Keep electrical cords out of the way. Do not use floor polish or wax that makes floors slippery. If you must use wax, use non-skid floor wax. Do not have throw rugs and other things on the floor that can make you trip. What can I do with my stairs? Do not leave any items on the stairs. Make sure that there are handrails on both sides of the stairs and use them. Fix handrails  that are broken or loose. Make sure that handrails are as long as the stairways. Check any carpeting to make sure that it is firmly attached to the stairs. Fix any carpet that is loose or worn. Avoid having throw rugs at the top or bottom of the stairs. If you do have throw rugs, attach them to the floor with carpet tape. Make sure that you have a light switch at the top of the stairs and the bottom of the stairs. If you do not have them, ask someone to add them for you. What else can I do to help prevent falls? Wear shoes that: Do not have high heels. Have rubber bottoms. Are comfortable and fit you well. Are closed at the toe. Do not wear sandals. If you use a stepladder: Make sure that it is fully opened. Do not climb a  closed stepladder. Make sure that both sides of the stepladder are locked into place. Ask someone to hold it for you, if possible. Clearly mark and make sure that you can see: Any grab bars or handrails. First and last steps. Where the edge of each step is. Use tools that help you move around (mobility aids) if they are needed. These include: Canes. Walkers. Scooters. Crutches. Turn on the lights when you go into a dark area. Replace any light bulbs as soon as they burn out. Set up your furniture so you have a clear path. Avoid moving your furniture around. If any of your floors are uneven, fix them. If there are any pets around you, be aware of where they are. Review your medicines with your doctor. Some medicines can make you feel dizzy. This can increase your chance of falling. Ask your doctor what other things that you can do to help prevent falls. This information is not intended to replace advice given to you by your health care provider. Make sure you discuss any questions you have with your health care provider. Document Released: 04/05/2009 Document Revised: 11/15/2015 Document Reviewed: 07/14/2014 Elsevier Interactive Patient Education  2017 [employer].

## 2021-07-30 NOTE — Progress Notes (Addendum)
Virtual Visit via Telephone Note  I connected with  Robert Gibbs on 07/30/21 at  3:15 PM EST by telephone and verified that I am speaking with the correct person using two identifiers.  Medicare Annual Wellness visit completed telephonically due to Covid-19 pandemic.   Persons participating in this call: This Health Coach and this patient.   Location: Patient: home Provider: office   I discussed the limitations, risks, security and privacy concerns of performing an evaluation and management service by telephone and the availability of in person appointments. The patient expressed understanding and agreed to proceed.  Unable to perform video visit due to video visit attempted and failed and/or patient does not have video capability.   Some vital signs may be absent or patient reported.   Willette Brace, LPN   Subjective:   Robert Gibbs is a 71 y.o. male who presents for Medicare Annual/Subsequent preventive examination.  Review of Systems     Cardiac Risk Factors include: advanced age (>39men, >35 women);obesity (BMI >30kg/m2);male gender;dyslipidemia;hypertension;sedentary lifestyle     Objective:    There were no vitals filed for this visit. There is no height or weight on file to calculate BMI.  Advanced Directives 07/30/2021 08/08/2020 07/28/2015 03/08/2014 03/02/2014 02/20/2014 11/07/2013  Does Patient Have a Medical Advance Directive? No No No No No No Patient does not have advance directive;Patient would not like information  Would patient like information on creating a medical advance directive? Yes (MAU/Ambulatory/Procedural Areas - Information given) No - Patient declined - - No - patient declined information No - patient declined information -  Pre-existing out of facility DNR order (yellow form or pink MOST form) - - - - - - No    Current Medications (verified) Outpatient Encounter Medications as of 07/30/2021  Medication Sig   albuterol (VENTOLIN HFA) 108 (90  Base) MCG/ACT inhaler Inhale 2 puffs into the lungs every 6 (six) hours as needed for wheezing or shortness of breath.   Ascorbic Acid (VITAMIN C) 1000 MG tablet Take 1,000 mg by mouth daily.   aspirin 81 MG tablet Take 81 mg by mouth daily.   buPROPion (WELLBUTRIN XL) 300 MG 24 hr tablet Take 1 tablet (300 mg total) by mouth daily.   Calcium Carbonate-Vit D-Min (CALCIUM 1200) 1200-1000 MG-UNIT CHEW Chew 1 tablet by mouth daily.    Coenzyme Q10 200 MG capsule Take 200 mg by mouth daily.   escitalopram (LEXAPRO) 20 MG tablet Take 1 tablet (20 mg total) by mouth daily.   ezetimibe (ZETIA) 10 MG tablet Take 1 tablet (10 mg total) by mouth daily.   gabapentin (NEURONTIN) 800 MG tablet TAKE 1 TABLET(800 MG) BY MOUTH TWICE DAILY   lisinopril (ZESTRIL) 40 MG tablet Take 1 tablet (40 mg total) by mouth daily.   magnesium oxide (MAG-OX) 400 MG tablet Take 400 mg by mouth daily.   Melatonin 5 MG CAPS Take 10 mg by mouth at bedtime. Patient stated that he is taking 10 mg daily   metoprolol succinate (TOPROL-XL) 100 MG 24 hr tablet Take 1.5 tablets (150 mg total) by mouth daily. Take with or immediately following a meal.   Multiple Vitamins-Minerals (MULTIVITAMIN WITH MINERALS) tablet Take 1 tablet by mouth daily.   Omega-3 Fat Ac-Cholecalciferol (FLEX OMEGA BENEFITS/VIT D-3 PO) Take 2 capsules by mouth daily.   omeprazole (PRILOSEC) 40 MG capsule Take 1 capsule (40 mg total) by mouth daily before breakfast.   triamcinolone (NASACORT) 55 MCG/ACT AERO nasal inhaler Place 2 sprays into  the nose at bedtime.   Vitamin D, Ergocalciferol, (DRISDOL) 1.25 MG (50000 UNIT) CAPS capsule Take 1 capsule (50,000 Units total) by mouth every 7 (seven) days.   zolpidem (AMBIEN) 10 MG tablet TAKE 1/2 TO 1 TABLET(5 TO 10 MG) BY MOUTH AT BEDTIME AS NEEDED FOR SLEEP   No facility-administered encounter medications on file as of 07/30/2021.    Allergies (verified) Codeine, Nsaids, Simvastatin, and Tolmetin   History: Past  Medical History:  Diagnosis Date   Anxiety and depression    Arthritis    Barrett esophagus    BPH (benign prostatic hyperplasia)    CTS (carpal tunnel syndrome)    h/o   Edema of both legs    legs , ankles and feet   Erectile dysfunction    GERD (gastroesophageal reflux disease) 03/18/2012   H/O hiatal hernia    HTN (hypertension)    Hyperlipidemia    Microscopic hematuria    Neuropathy    OSA (obstructive sleep apnea) 03/18/2012   on BiPap   Prostatitis    PVD (peripheral vascular disease) ([place])    Spinal stenosis 06-2012   saw neuro w/ "neuropathy", dx w/ spinal stenosis   Varicose veins    Vitamin D deficiency    Wears glasses    Past Surgical History:  Procedure Laterality Date   ARCH BAR REMOVAL     BACK SURGERY  2002   L4-L5   ELBOW SURGERY  1992   for tennis elbow-rt   ESOPHAGOGASTRODUODENOSCOPY  04/2015   w/ biopsy, short tongue of Barrett's without obvious nodularity, mass or esophagitis seen   EYE SURGERY Right    cataract with lens implant   FOOT SURGERY     R, for plantar fasciitis   HERNIA REPAIR     x 2 as a child-ing    KNEE ARTHROSCOPY WITH MENISCAL REPAIR Bilateral 05/04/2013   Procedure: BILATERAL KNEE ARTHROSCOPY WITH POSSIBLE CHONDROPLASTY AND MENISCECTOMY ;  Surgeon: Kerin Salen, MD;  Location: [place] [facility];  Service: Orthopedics;  Laterality: Bilateral;  Right chrondroplasty, debridement, , partial medial menisectomy, removal of loose bodies.    LIGATION / DIVISION SAPHENOUS VEIN     both legs   PROSTATE BIOPSY     2010, (-)   TONSILLECTOMY     TOTAL KNEE ARTHROPLASTY Right 11/07/2013   Procedure: TOTAL KNEE ARTHROPLASTY;  Surgeon: Kerin Salen, MD;  Location: [place];  Service: Orthopedics;  Laterality: Right;   TOTAL KNEE ARTHROPLASTY Left 03/01/2014   Procedure: TOTAL KNEE ARTHROPLASTY;  Surgeon: Kerin Salen, MD;  Location: [place] [facility];  Service: Orthopedics;  Laterality: Left;   UPPER GASTROINTESTINAL ENDOSCOPY      UVULOPALATOPHARYNGOPLASTY (UPPP)/TONSILLECTOMY/SEPTOPLASTY  1990   Family History  Problem Relation Age of Onset   CAD Father        ?   Alzheimer's disease Mother    Stroke Brother    Colon cancer Neg Hx    Prostate cancer Neg Hx    Diabetes Neg Hx    Cancer Neg Hx    Social History   Socioeconomic History   Marital status: Divorced    Spouse name: Not on file   Number of children: 1   Years of education: Not on file   Highest education level: Bachelor's degree (e.g., BA, AB, BS)  Occupational History   Occupation: retired   Tobacco Use   Smoking status: Former    Packs/day: 1.00    Years: 40.00    Pack years:  40.00    Types: Cigarettes    Start date: 30    Quit date: 06/29/2008    Years since quitting: 13.0   Smokeless tobacco: Never   Tobacco comments:    quit 2010  Substance and Sexual Activity   Alcohol use: Yes    Alcohol/week: 3.0 standard drinks    Types: 3 Standard drinks or equivalent per week    Comment: 2-3 large drinks, Vodka, 1-2 times a week   Drug use: No   Sexual activity: Yes  Other Topics Concern   Not on file  Social History Narrative   Lives w/  fiance of > 20 years    caffeine- 2-3  c daily       Social Determinants of Health   Financial Resource Strain: Low Risk    Difficulty of Paying Living Expenses: Not hard at all  Food Insecurity: No Food Insecurity   Worried About Charity fundraiser in the Last Year: Never true   Ran Out of Food in the Last Year: Never true  Transportation Needs: No Transportation Needs   Lack of Transportation (Medical): No   Lack of Transportation (Non-Medical): No  Physical Activity: Inactive   Days of Exercise per Week: 0 days   Minutes of Exercise per Session: 0 min  Stress: Stress Concern Present   Feeling of Stress : To some extent  Social Connections: Socially Isolated   Frequency of Communication with Friends and Family: Once a week   Frequency of Social Gatherings with Friends and Family: Once a  week   Attends Religious Services: Never   Marine scientist or Organizations: No   Attends Music therapist: Never   Marital Status: Divorced    Tobacco Counseling Counseling given: Not Answered Tobacco comments: quit 2010   Clinical Intake:  Pre-visit preparation completed: Yes  Pain : No/denies pain     BMI - recorded: 43.62 Nutritional Status: BMI > 30  Obese Nutritional Risks: None  How often do you need to have someone help you when you read instructions, pamphlets, or other written materials from your doctor or pharmacy?: 1 - Never  Diabetic?no  Interpreter Needed?: No  Information entered by :: Charlott Rakes, LPN   Activities of Daily Living In your present state of health, do you have any difficulty performing the following activities: 07/30/2021 05/29/2021  Hearing? Y [place]? N N  Difficulty concentrating or making decisions? N N  Walking or climbing stairs? Y N  Dressing or bathing? N N  Doing errands, shopping? N N  Preparing Food and eating ? N -  Using the Toilet? N -  In the past six months, have you accidently leaked urine? Y -  Comment at times wears pads -  Do you have problems with loss of bowel control? N -  Managing your Medications? N -  Managing your Finances? N -  Housekeeping or managing your Housekeeping? N -  Some recent data might be hidden    Patient Care Team: Colon Branch, MD as PCP - General (Internal Medicine) Lake Bells., MD as Referring Physician (Gastroenterology) Felipa Eth, Reece Leader., MD as Referring Physician (Urology)  Indicate any recent Medical Services you may have received from other than [facility] providers in the past year (date may be approximate).     Assessment:   This is a routine wellness examination for [place].  Hearing/Vision screen Hearing Screening - Comments:: Pt stated [place]  Screening - Comments:: Pt follow sup with [facility] for eye exams   Dietary  issues and exercise activities discussed: Current Exercise Habits: The patient does not participate in regular exercise at present   Goals Addressed             This Visit's Progress    Patient Stated       Lose weight        Depression Screen PHQ 2/9 Scores 07/30/2021 05/29/2021 08/03/2020 01/02/2020 06/02/2018 06/24/2017 04/21/2016  PHQ - 2 Score 2 2 2 2 1  0 3  PHQ- 9 Score 4 8 8 8 6  - -    Fall Risk Fall Risk  07/30/2021 05/29/2021 08/03/2020 08/02/2019 01/20/2019  Falls in the past year? 0 0 1 0 (No Data)  Comment - - - - Emmi Telephone Survey: data to providers prior to load  Number falls in past yr: 0 0 0 0 (No Data)  Comment - - - - Emmi Telephone Survey Actual Response =   Injury with Fall? 0 0 0 0 -  Risk for fall due to : Impaired vision;Impaired balance/gait;Impaired mobility - - - -  Risk for fall due to: Comment - - - - -  Follow up Falls prevention discussed Falls evaluation completed - Falls evaluation completed -    FALL RISK PREVENTION PERTAINING TO THE HOME:  Any stairs in or around the home? Yes  If so, are there any without handrails? No  Home free of loose throw rugs in walkways, pet beds, electrical cords, etc? Yes  Adequate lighting in your home to reduce risk of falls? Yes   ASSISTIVE DEVICES UTILIZED TO PREVENT FALLS:  Life alert? No  Use of a cane, walker or w/c? No  Grab bars in the bathroom? No  Shower chair or bench in shower? No  Elevated toilet seat or a handicapped toilet? No   TIMED UP AND GO:  Was the test performed? No .  Cognitive Function:     6CIT Screen 07/30/2021  What Year? 0 points  What month? 0 points  What time? 0 points  Count back from 20 0 points  Months in reverse 2 points  Repeat phrase 4 points  Total Score 6    Immunizations Immunization History  Administered Date(s) Administered   Fluad Quad(high Dose 65+) 04/22/2019, 05/02/2020   Influenza Split 04/27/2012   Influenza, High Dose Seasonal PF 03/21/2016,  05/29/2017, 06/02/2018   Influenza,inj,Quad PF,6+ Mos 03/24/2013, 04/18/2014, 04/24/2015   Influenza-Unspecified 03/23/2021   PFIZER(Purple Top)SARS-COV-2 Vaccination 08/15/2019, 09/05/2019, 08/01/2020   PPD Test 11/09/2013   Pfizer Covid-19 Vaccine Bivalent Booster 75yrs & up 04/18/2021   Pneumococcal Conjugate-13 04/18/2014   Pneumococcal Polysaccharide-23 03/24/2013, 05/29/2021   Tdap 11/16/2012   Zoster Recombinat (Shingrix) 05/26/2019   Zoster, Live 11/17/2012    TDAP status: Up to date  Flu Vaccine status: Up to date  Pneumococcal vaccine status: Up to date  Covid-19 vaccine status: Completed vaccines  Qualifies for Shingles Vaccine? Yes   Zostavax completed Yes   Shingrix Completed?: Yes  Screening Tests Health Maintenance  Topic Date Due   Zoster Vaccines- Shingrix (2 of 2) 07/21/2019   TETANUS/TDAP  11/17/2022   COLONOSCOPY (Pts 45-52yrs Insurance coverage will need to be confirmed)  12/01/2025   Pneumonia Vaccine 47+ Years old  Completed   INFLUENZA VACCINE  Completed   COVID-19 Vaccine  Completed   Hepatitis C Screening  Completed   HPV VACCINES  Aged Out    Health Maintenance  Health Maintenance Due  Topic Date Due   Zoster Vaccines- Shingrix (2 of 2) 07/21/2019    Colorectal cancer screening: Type of screening: Colonoscopy. Completed 12/02/18. Repeat every 7 years  Additional Screening:  Hepatitis C Screening:  Completed 03/21/16  Vision Screening: Recommended annual ophthalmology exams for early detection of glaucoma and other disorders of the eye. Is the patient up to date with their annual eye exam?  Yes  Who is the provider or what is the name of the office in which the patient attends annual eye exams? [facility]  If pt is not established with a provider, would they like to be referred to a provider to establish care? No .   Dental Screening: Recommended annual dental exams for proper oral hygiene  Community Resource Referral / Chronic Care  Management: CRR required this visit?  No   CCM required this visit?  No      Plan:     I have personally reviewed and noted the following in the patients chart:   Medical and social history Use of alcohol, tobacco or illicit drugs  Current medications and supplements including opioid prescriptions. Patient is not currently taking opioid prescriptions. Functional ability and status Nutritional status Physical activity Advanced directives List of other physicians Hospitalizations, surgeries, and ER visits in previous 12 months Vitals Screenings to include cognitive, depression, and falls Referrals and appointments  In addition, I have reviewed and discussed with patient certain preventive protocols, quality metrics, and best practice recommendations. A written personalized care plan for preventive services as well as general preventive health recommendations were provided to patient.     Willette Brace, LPN   07/30/7410   Nurse Notes: Pt stated that he is experiencing more neuropathy issues in his feet and pain in his neck.  He stated he would like to have physical therapy. Encouraged pt to make an earlier appt to discuss concerns please advise   I have reviewed and agree with Health Coaches documentation.  Kathlene November, MD

## 2021-08-01 ENCOUNTER — Encounter: Payer: Self-pay | Admitting: Internal Medicine

## 2021-09-11 ENCOUNTER — Other Ambulatory Visit: Payer: Self-pay | Admitting: Internal Medicine

## 2021-09-12 ENCOUNTER — Other Ambulatory Visit: Payer: Self-pay

## 2021-09-12 ENCOUNTER — Ambulatory Visit (HOSPITAL_BASED_OUTPATIENT_CLINIC_OR_DEPARTMENT_OTHER)
Admission: RE | Admit: 2021-09-12 | Discharge: 2021-09-12 | Disposition: A | Discharge status: Home / Self Care | Payer: Medicare Other | Source: Ambulatory Visit | Attending: Acute Care | Admitting: Acute Care

## 2021-09-12 DIAGNOSIS — Z87891 Personal history of nicotine dependence: Secondary | ICD-10-CM | POA: Diagnosis not present

## 2021-09-16 ENCOUNTER — Other Ambulatory Visit: Payer: Self-pay

## 2021-09-16 ENCOUNTER — Telehealth: Payer: Self-pay

## 2021-09-16 DIAGNOSIS — Z87891 Personal history of nicotine dependence: Secondary | ICD-10-CM

## 2021-09-16 NOTE — Telephone Encounter (Signed)
Left voicemail and call back number for patient to call for LDCT results.  Next CT order placed and results with notation of aortic valve calcification have been faxed to PCP.  Left message to call in order to review this with the patient ?

## 2021-09-19 NOTE — Telephone Encounter (Signed)
Returned call to  patient x 2.  Left voicemail but unable to reach.  Mailed letter with results to patient this week, since unable to reach by phone regarding results and notation of aortic valve calcification. ?

## 2021-09-19 NOTE — Telephone Encounter (Signed)
Spoke with patient regarding results of LDCT.  No findings suspicious for lung cancer.  Atherosclerosis and emphysema noted as previous.  There was a notation of calcification of the aortic valve.  A message was routed with the results to PCP.  Wanted to make patient aware of finding so he could discuss further with his provider to determine if any additional testing was needed, such as echocardiogram.  Patient acknowledged understanding and had no further questions. ?

## 2021-10-03 IMAGING — MR MR LUMBAR SPINE W/O CM
4 of 5 series · 25 of 48 positions shown · non-contrast
Comparison: MRI lumbar spine 11/04/2008.

CLINICAL DATA: Spinal stenosis. Low back pain extending into the
lower extremities bilaterally to the feet fluid bilateral foot
numbness. Personal history of lumbar spine surgery.

EXAM:
MRI LUMBAR SPINE WITHOUT CONTRAST
TECHNIQUE: Multiplanar, multisequence MR imaging of the lumbar spine was
performed. No intravenous contrast was administered.

[Series 3: T2 · sagittal · 4.0mm · 0.53mm/px · 6 of 17 slices shown (1 of 2)]
[im 1/17]
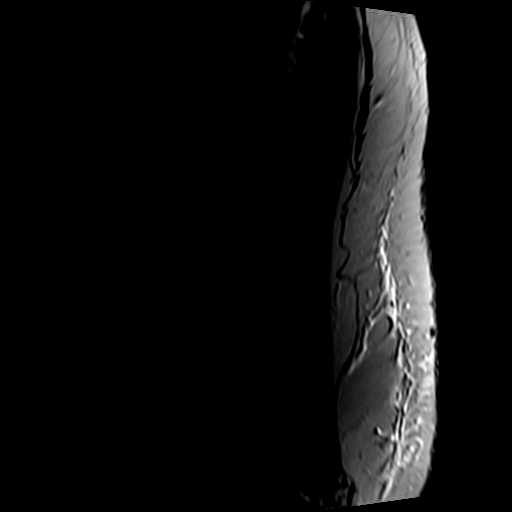
[im 4/17]
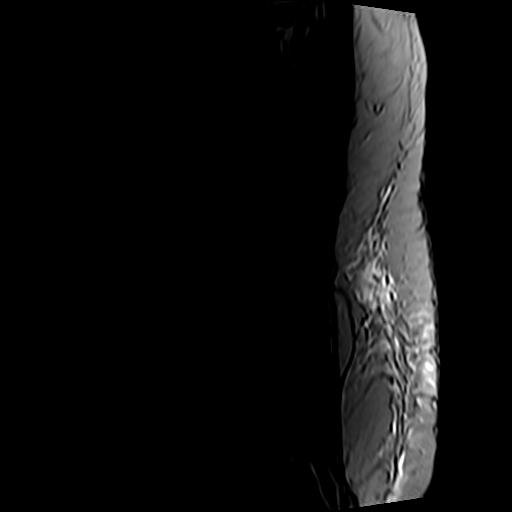
[im 7/17]
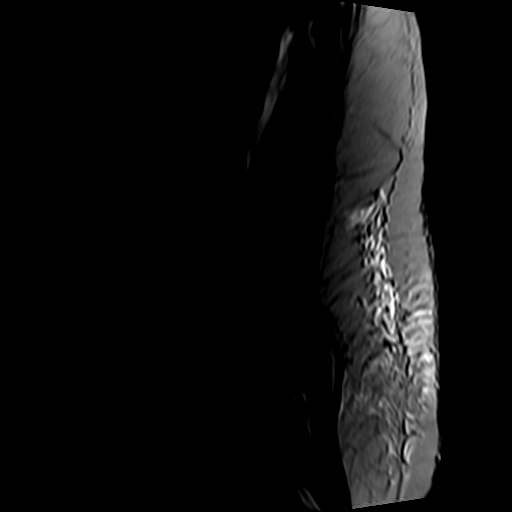
[im 10/17]
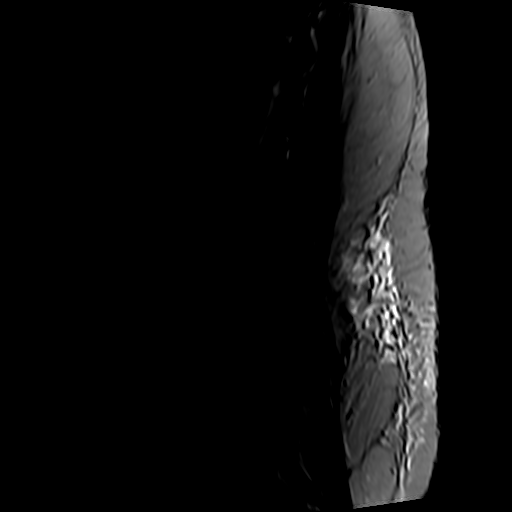
[im 13/17]
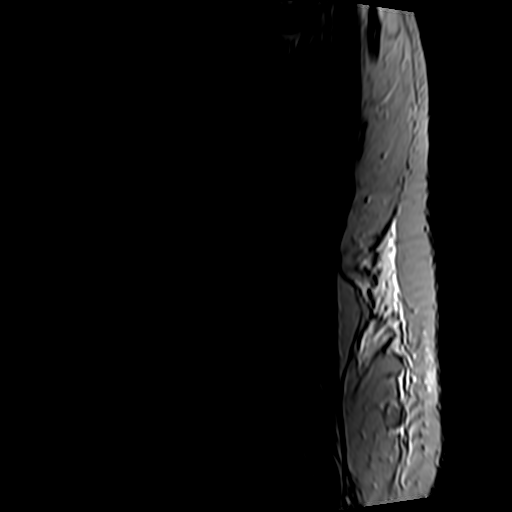
[im 17/17]
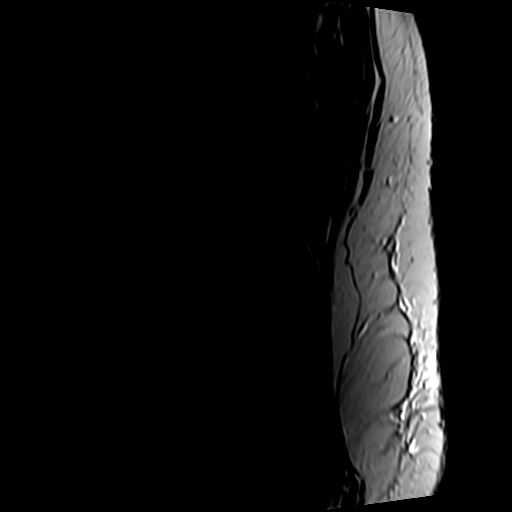

[Series 5: T1 · sagittal · 4.0mm · 0.53mm/px · 6 of 17 slices shown (1 of 2)]
[im 1/17]
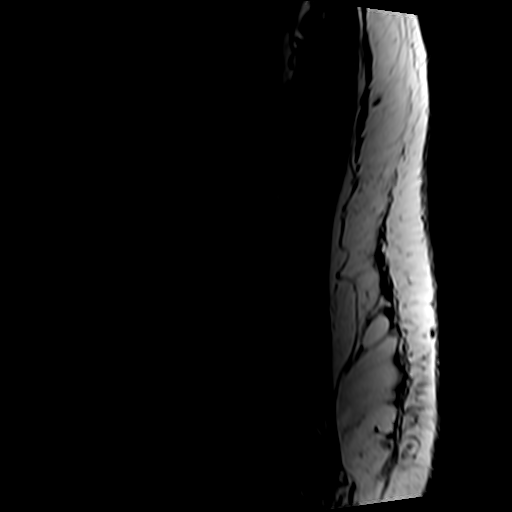
[im 4/17]
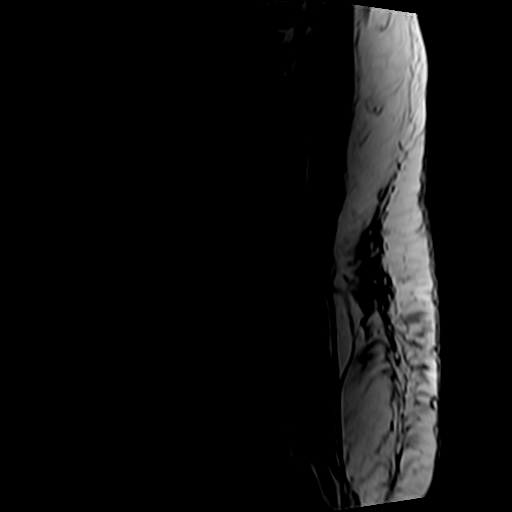
[im 7/17]
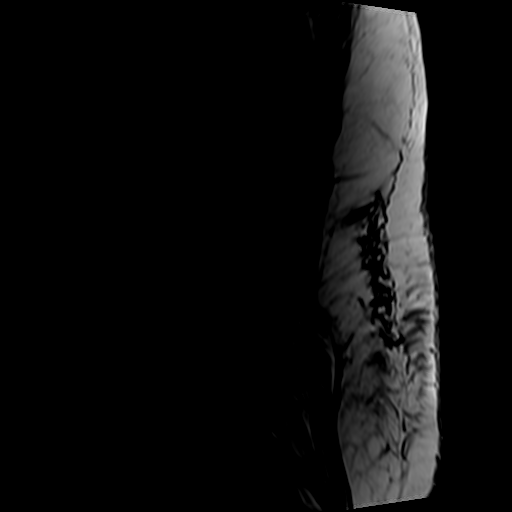
[im 10/17]
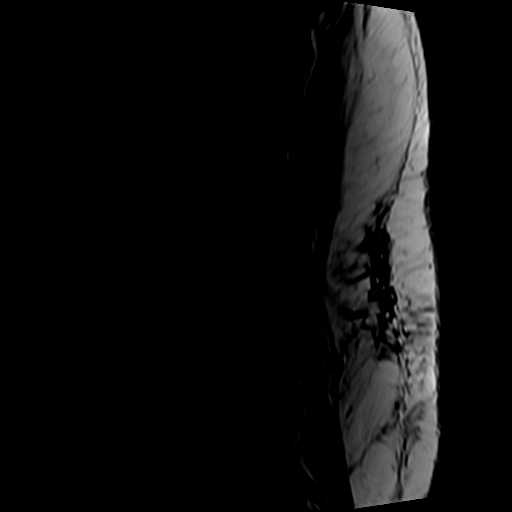
[im 13/17]
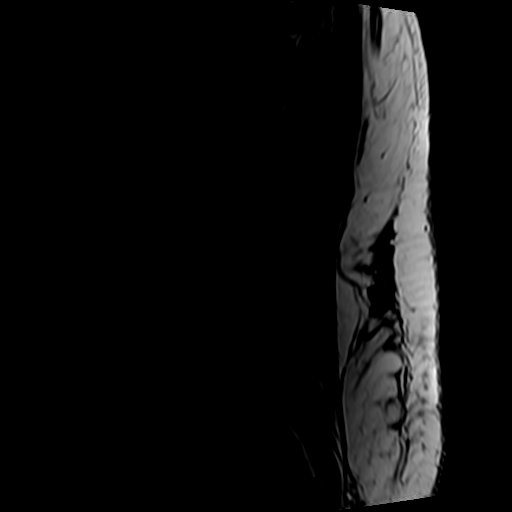
[im 17/17]
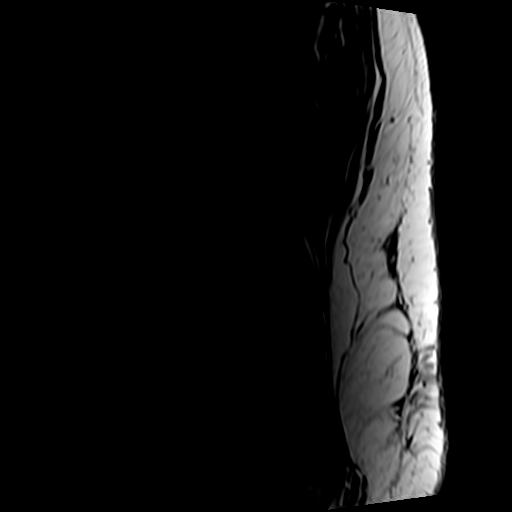

[Series 6: T2 · axial · 4.0mm · 0.82mm/px · z∈[-141,+68]mm · 9 of 40 slices shown (2 of 2)]
[im 1/40]
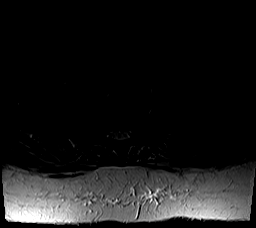
[im 6/40]
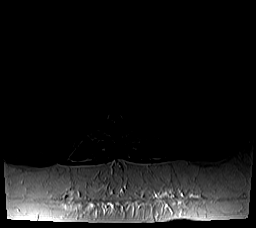
[im 12/40]
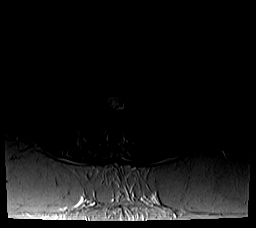
[im 17/40]
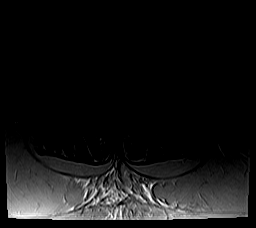
[im 20/40]
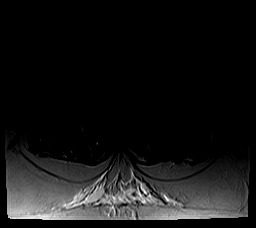
[im 23/40]
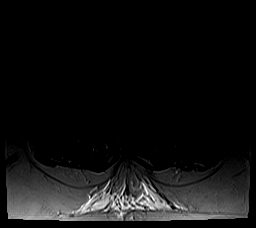
[im 28/40]
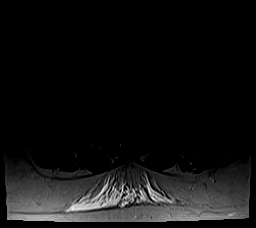
[im 34/40]
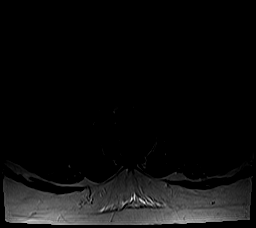
[im 40/40]
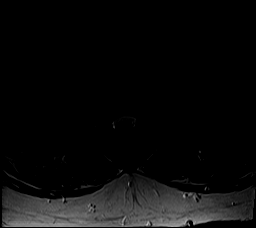

[Series 7: T1 · axial · 4.0mm · 0.41mm/px · z∈[-141,+37]mm · 4 of 40 slices shown (2 of 2)]
[im 1/40]
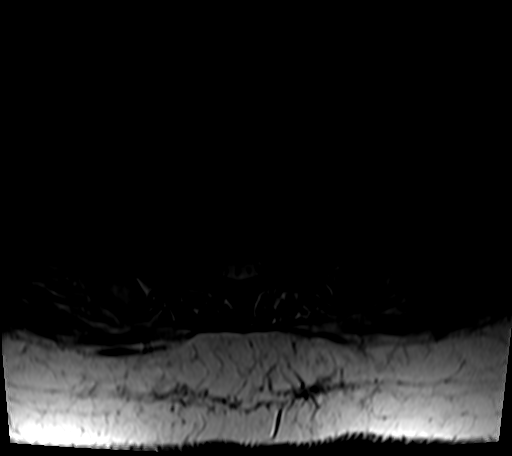
[im 6/40]
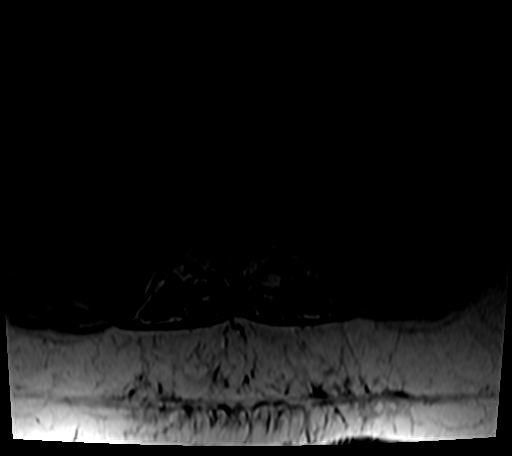
[im 20/40]
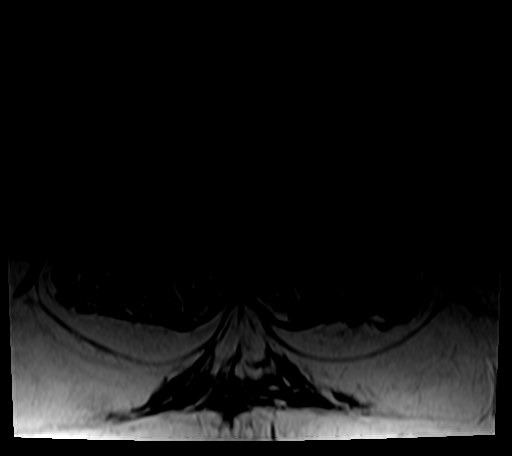
[im 34/40]
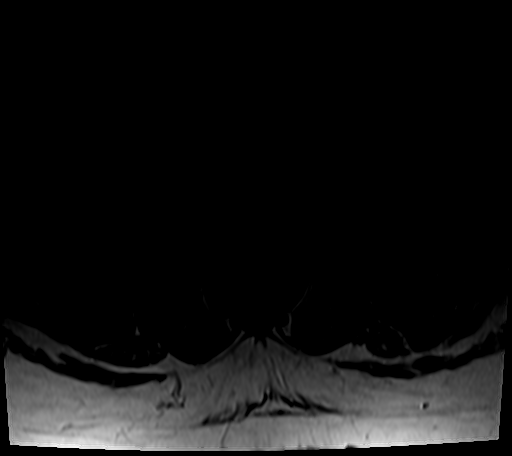

[25 of 48 positions shown; findings below may reference images not displayed]

FINDINGS: Segmentation:  Transitional S1 segment noted.

Alignment: Slight retrolisthesis present at L1-2, L2-3, and L3-4.
Listhesis at L1-2 and L2-3 has progressed. 4 mm anterolisthesis at
L4-5 is new from the prior study.

Vertebrae: Mild fatty endplate marrow changes are present anteriorly
at L1-2. Minimal fatty endplate marrow changes are present at L5-S1.
Marrow signal and vertebral body heights are otherwise normal.

Conus medullaris and cauda equina: Conus extends to the L1-2 level.
Conus and cauda equina appear normal.

Paraspinal and other soft tissues: Limited imaging the abdomen is
unremarkable. There is no significant adenopathy. No solid organ
lesions are present.

Disc levels:

L1-2: Mild facet hypertrophy is present. New retrolisthesis and
uncovering of a broad-based disc protrusion results in mild
bilateral foraminal narrowing. Central canal is patent.

L2-3: Progressive broad-based disc protrusion and moderate facet
hypertrophy results in mild central and moderate bilateral foraminal
stenosis.

L3-4: Progressive broad-based disc protrusion and moderate facet
hypertrophy is noted. Mild central canal narrowing is worse on the
left. Moderate foraminal stenosis is worse on the left.

L4-5: A broad-based disc protrusion is and moderate bilateral facet
hypertrophy has progressed. Anterolisthesis is new. Moderate central
and bilateral foraminal narrowing has progressed.

L5-S1: Left laminectomy noted. Moderate facet hypertrophy is
evident. Mild bilateral foraminal narrowing is stable.
IMPRESSION: 1. Progressive multilevel spondylosis of the lumbar spine.
2. Mild bilateral foraminal narrowing at L1-2.
3. Mild central and moderate bilateral foraminal stenosis at L2-3 is
worse on the left.
4. Mild central and moderate bilateral foraminal narrowing at L3-4
is worse on the left.
5. Moderate central and bilateral foraminal narrowing at L4-5 has
progressed.
6. Mild bilateral foraminal narrowing at L5-S1 is stable.

## 2021-10-14 ENCOUNTER — Ambulatory Visit (INDEPENDENT_AMBULATORY_CARE_PROVIDER_SITE_OTHER): Payer: Medicare Other | Admitting: Internal Medicine

## 2021-10-14 ENCOUNTER — Encounter: Payer: Self-pay | Admitting: Internal Medicine

## 2021-10-14 VITALS — BP 132/80 | HR 68 | Temp 97.9°F | Resp 18 | Ht 70.0 in | Wt 296.4 lb

## 2021-10-14 DIAGNOSIS — M542 Cervicalgia: Secondary | ICD-10-CM | POA: Diagnosis not present

## 2021-10-14 NOTE — Progress Notes (Signed)
? ?Subjective:  ? ? Patient ID: Robert Gibbs, male    DOB: 1950-12-29, 71 y.o.   MRN: [id number] ? ?DOS:  10/14/2021 ?Type of visit - description: Acute ? ?Complain of neck pain for actually several months, on and off. ?The pain is located at the posterior neck, worse on the right. ?Denies radiation to the arms, he does have some paresthesias at the upper extremities that he thinks are related to neuropathy and not to neck pain. ?He also admits to headaches, not a new issue. ? ?Interestingly, he reports the pain is slightly worse when he swallow, that is happening only over the last few days. ?Denies any GERD ?No URI type of symptoms ?No fever chills  ?Very rarely has dysphagia only with big pills. ?Voice has been slightly raspy for the last week as well ? ?Review of Systems ?See above  ? ?Past Medical History:  ?Diagnosis Date  ? Anxiety and depression   ? Arthritis   ? Barrett esophagus   ? BPH (benign prostatic hyperplasia)   ? CTS (carpal tunnel syndrome)   ? h/o  ? Edema of both legs   ? legs , ankles and feet  ? Erectile dysfunction   ? GERD (gastroesophageal reflux disease) 03/18/2012  ? H/O hiatal hernia   ? HTN (hypertension)   ? Hyperlipidemia   ? Microscopic hematuria   ? Neuropathy   ? OSA (obstructive sleep apnea) 03/18/2012  ? on BiPap  ? Prostatitis   ? PVD (peripheral vascular disease) ([place])   ? Spinal stenosis 06-2012  ? saw neuro w/ "neuropathy", dx w/ spinal stenosis  ? Varicose veins   ? Vitamin D deficiency   ? Wears glasses   ? ? ?Past Surgical History:  ?Procedure Laterality Date  ? ARCH BAR REMOVAL    ? BACK SURGERY  2002  ? L4-L5  ? ELBOW SURGERY  1992  ? for tennis elbow-rt  ? ESOPHAGOGASTRODUODENOSCOPY  04/2015  ? w/ biopsy, short tongue of Barrett's without obvious nodularity, mass or esophagitis seen  ? EYE SURGERY Right   ? cataract with lens implant  ? FOOT SURGERY    ? R, for plantar fasciitis  ? HERNIA REPAIR    ? x 2 as a child-ing   ? KNEE ARTHROSCOPY WITH MENISCAL REPAIR Bilateral  05/04/2013  ? Procedure: BILATERAL KNEE ARTHROSCOPY WITH POSSIBLE CHONDROPLASTY AND MENISCECTOMY ;  Surgeon: Kerin Salen, MD;  Location: [facility];  Service: Orthopedics;  Laterality: Bilateral;  Right chrondroplasty, debridement, , partial medial menisectomy, removal of loose bodies. ?  ? LIGATION / DIVISION SAPHENOUS VEIN    ? both legs  ? PROSTATE BIOPSY    ? 2010, (-)  ? TONSILLECTOMY    ? TOTAL KNEE ARTHROPLASTY Right 11/07/2013  ? Procedure: TOTAL KNEE ARTHROPLASTY;  Surgeon: Kerin Salen, MD;  Location: [place];  Service: Orthopedics;  Laterality: Right;  ? TOTAL KNEE ARTHROPLASTY Left 03/01/2014  ? Procedure: TOTAL KNEE ARTHROPLASTY;  Surgeon: Kerin Salen, MD;  Location: [facility];  Service: Orthopedics;  Laterality: Left;  ? UPPER GASTROINTESTINAL ENDOSCOPY    ? UVULOPALATOPHARYNGOPLASTY (UPPP)/TONSILLECTOMY/SEPTOPLASTY  1990  ? ? ?Current Outpatient Medications  ?Medication Instructions  ? albuterol (VENTOLIN HFA) 108 (90 Base) MCG/ACT inhaler 2 puffs, Inhalation, Every 6 hours PRN  ? aspirin 81 mg, Oral, Daily  ? buPROPion (WELLBUTRIN XL) 300 mg, Oral, Daily  ? Calcium Carbonate-Vit D-Min (CALCIUM 1200) 1200-1000 MG-UNIT CHEW 1 tablet, Daily  ? Coenzyme Q10 200  mg, Daily  ? escitalopram (LEXAPRO) 20 mg, Oral, Daily  ? ezetimibe (ZETIA) 10 mg, Oral, Daily  ? gabapentin (NEURONTIN) 800 MG tablet TAKE 1 TABLET(800 MG) BY MOUTH TWICE DAILY  ? lisinopril (ZESTRIL) 40 mg, Oral, Daily  ? magnesium oxide (MAG-OX) 400 mg, Daily  ? Melatonin 10 mg, Daily at bedtime  ? metoprolol succinate (TOPROL-XL) 150 mg, Oral, Daily, Take with or immediately following a meal.  ? Multiple Vitamins-Minerals (MULTIVITAMIN WITH MINERALS) tablet 1 tablet, Daily  ? Omega-3 Fat Ac-Cholecalciferol (FLEX OMEGA BENEFITS/VIT D-3 PO) 2 capsules, Daily  ? omeprazole (PRILOSEC) 40 mg, Oral, Daily before breakfast  ? triamcinolone (NASACORT) 55 MCG/ACT AERO nasal inhaler 2 sprays, Daily at bedtime  ? vitamin C 1,000 mg, Daily  ?  zolpidem (AMBIEN) 10 MG tablet TAKE 1/2 TO 1 TABLET(5 TO 10 MG) BY MOUTH AT BEDTIME AS NEEDED FOR SLEEP  ? ? ?   ?Objective:  ? Physical Exam ?BP 132/80 (BP Location: Left Arm, Patient Position: Sitting, Cuff Size: Normal)   Pulse 68   Temp 97.9 ?F (36.6 ?C) (Oral)   Resp 18   Ht '5\' 10"'$  (1.778 m)   Wt 296 lb 6 oz (134.4 kg)   SpO2 97%   BMI 42.53 kg/m?  ?General:   ?Well developed, NAD, BMI noted. ?HEENT:  ?Normocephalic . Face symmetric, atraumatic ?Throat: Symmetric, status post uvulectomy, no redness, no white patches. ?Neck: ?No thyromegaly, no lymphadenopathies, no mass or lump ?Neck range of motion is severely limited throughout.  No TTP of the cervical spine ?Lower extremities: no pretibial edema bilaterally  ?Skin: Not pale. Not jaundice ?Neurologic:  ?alert & oriented X3.  ?Speech normal, gait appropriate for age and unassisted ?Motor: Symmetric.  DTRs: Symmetric at the upper extremities, absent knee jerk bilaterally. ?Psych--  ?Cognition and judgment appear intact.  ?Cooperative with normal attention span and concentration.  ?Behavior appropriate. ?No anxious or depressed appearing.  ? ?   ?Assessment   ? ?Assessment ?Hyperglycemia ?HTN ?Hyperlipidemia: s/e  with simvastatin, declined other statins 08-2019 ?Chronic lower extremity edema w/ some pretibial redness  (ER 2-17, [place] neg DVT, BNP wnl) ?anxiety depression, insomnia ?COPD: Per CT 03-2016 ?Coronary calcifications per CTA. ?Morbid obesity ?GI: GERD, Barrett's  esophagus ?OSA, BiPAP, per pulmonary ?GU: Dr Felipa Eth  ?--LUTS , failed a # of medications ?--BPH, elevated PSA:  (-)  biopsy 2009, PSA 05/2014 2.92 ?--ED ?--Hematuria, CT, cystoscopy 2009 negative ?--Prostatitis, admitted 03-2020 ?Vitamin D deficiency  ?MSK: ?-- Spinal stenosis : saw neurology w/ sx of paresthesias feet-ankle, sx felt to be d/t spinal stenosis not neuropathy, see OV note 08-07-2012 , used to see pain med (Dr Vira Blanco) ?-High Risk UDS 2016 ?--CTS ?Diastasis  recti ? ?PLAN ?Neck pain: ?Going on for several months, suspect MSK issue likely DJD.  Neurological exam is symmetric. ?Interestingly the pain seems to be worse when he swallows, this is going on for only 1 week, throat exam is benign, soft tissue of the neck is  benign on exam. ?Plan: Refer to Ortho for chronic neck pain, suspect MSK/DJD.  Tylenol as needed. ?Call if fever, chills, swallowing symptoms become more prominent. ? ? ?This visit occurred during the SARS-CoV-2 public health emergency.  Safety protocols were in place, including screening questions prior to the visit, additional usage of staff PPE, and extensive cleaning of exam room while observing appropriate contact time as indicated for disinfecting solutions.  ? ?

## 2021-10-14 NOTE — Patient Instructions (Signed)
We are referring you to the orthopedic doctor ? ?Tylenol  500 mg OTC 2 tabs a day every 8 hours as needed for pain ? ? ?

## 2021-10-15 NOTE — Assessment & Plan Note (Signed)
Neck pain: ?Going on for several months, suspect MSK issue likely DJD.  Neurological exam is symmetric. ?Interestingly the pain seems to be worse when he swallows, this is going on for only 1 week, throat exam is benign, soft tissue of the neck is  benign on exam. ?Plan: Refer to Ortho for chronic neck pain, suspect MSK/DJD.  Tylenol as needed. ?Call if fever, chills, swallowing symptoms become more prominent. ?

## 2021-10-21 ENCOUNTER — Telehealth: Payer: Self-pay | Admitting: Internal Medicine

## 2021-10-21 NOTE — Telephone Encounter (Signed)
Patient states he has been waiting a couple weeks for his ortho referral and when he called the [phone] number they told him they have not received one for him. They gave him a few fax numbers to resend it: ?[phone] ?[phone] ? ? ?Sent to [facility] ?[address] ? [place], [place] [address] ?[phone] ?

## 2021-10-21 NOTE — Telephone Encounter (Signed)
Can you resend referral please?  ?

## 2021-11-04 NOTE — Telephone Encounter (Signed)
Pt would like to know why he was sent to a [facility] and would like it to be sent somewhere [facility]. Please advise.  ?

## 2021-11-04 NOTE — Telephone Encounter (Signed)
Can you resend referral to [facility] please?  ?

## 2021-11-15 ENCOUNTER — Ambulatory Visit: Payer: Medicare Other | Admitting: Physician Assistant

## 2021-11-27 ENCOUNTER — Encounter: Payer: Self-pay | Admitting: Internal Medicine

## 2021-11-27 ENCOUNTER — Ambulatory Visit (INDEPENDENT_AMBULATORY_CARE_PROVIDER_SITE_OTHER): Payer: Medicare Other | Admitting: Internal Medicine

## 2021-11-27 VITALS — BP 138/86 | HR 78 | Temp 98.3°F | Resp 18 | Ht 70.0 in | Wt 302.2 lb

## 2021-11-27 DIAGNOSIS — I10 Essential (primary) hypertension: Secondary | ICD-10-CM

## 2021-11-27 DIAGNOSIS — F419 Anxiety disorder, unspecified: Secondary | ICD-10-CM | POA: Diagnosis not present

## 2021-11-27 DIAGNOSIS — F32A Depression, unspecified: Secondary | ICD-10-CM

## 2021-11-27 DIAGNOSIS — I700 Atherosclerosis of aorta: Secondary | ICD-10-CM | POA: Diagnosis not present

## 2021-11-27 DIAGNOSIS — E785 Hyperlipidemia, unspecified: Secondary | ICD-10-CM | POA: Diagnosis not present

## 2021-11-27 MED ORDER — ROSUVASTATIN CALCIUM 5 MG PO TABS: 5.0000 mg | ORAL_TABLET | Freq: Every day | ORAL | 1 refills | Status: DC

## 2021-11-27 MED ORDER — GABAPENTIN 800 MG PO TABS
800.0000 mg | ORAL_TABLET | Freq: Every day | ORAL | Status: DC
Start: 2021-11-27 — End: 2023-05-19

## 2021-11-27 NOTE — Assessment & Plan Note (Signed)
Hyperlipidemia: On Zetia only, previously simvastatin caused s/e.  Has coronary calcification per CT, no CV sxs  Advised a trial with low-dose of a statin, Crestor 5 mg nightly.  He is agreeable.  Work on diet.  Labs in 6 weeks.. Anxiety depression insomnia: Continue Ambien, Lexapro, Wellbutrin.  Contract signed, RF meds prn. Aortic calcification per CT: Echo is a consideration, no murmur on exam, no cardiovascular symptoms.  Discussed with patient above, we agreed on observation. Neuropathy: Self decrease gabapentin to once a day, did not feel it was helping much, he will considering stop it completely. RTC labs 6 weeks RTC CPX 6 months

## 2021-11-27 NOTE — Patient Instructions (Addendum)
Recommend to check with your pharmacy to make sure you have completed the 2nd Shingrix (shingles) vaccine.   In addition to Zetia 10 mg, start taking rosuvastatin 5 mg every night.  These two medicines will help you get your cholesterol down  Watch your diet carefully  Check the  blood pressure regularly BP GOAL is between 110/65 and  135/85. If it is consistently higher or lower, let me know    [place], [place] back for blood work, fasting, in 6 weeks.  Please make an appointment  Come back for a physical exam by December 2023

## 2021-11-27 NOTE — Progress Notes (Signed)
Subjective:    Patient ID: Robert Gibbs, male    DOB: 07/28/1950, 71 y.o.   MRN: [id number]  DOS:  11/27/2021 Type of visit - description: f/u  Since the last office visit is doing well, no new symptoms or concerns. Still has paresthesias, LUTS and insomnia.  Wt Readings from Last 3 Encounters:  11/27/21 (!) 302 lb 4 oz (137.1 kg)  10/14/21 296 lb 6 oz (134.4 kg)  05/29/21 (!) 304 lb (137.9 kg)     Review of Systems See above   Past Medical History:  Diagnosis Date   Anxiety and depression    Arthritis    Barrett esophagus    BPH (benign prostatic hyperplasia)    CTS (carpal tunnel syndrome)    h/o   Edema of both legs    legs , ankles and feet   Erectile dysfunction    GERD (gastroesophageal reflux disease) 03/18/2012   H/O hiatal hernia    HTN (hypertension)    Hyperlipidemia    Microscopic hematuria    Neuropathy    OSA (obstructive sleep apnea) 03/18/2012   on BiPap   Prostatitis    PVD (peripheral vascular disease) ([place])    Spinal stenosis 06-2012   saw neuro w/ "neuropathy", dx w/ spinal stenosis   Varicose veins    Vitamin D deficiency    Wears glasses     Past Surgical History:  Procedure Laterality Date   ARCH BAR REMOVAL     BACK SURGERY  2002   L4-L5   ELBOW SURGERY  1992   for tennis elbow-rt   ESOPHAGOGASTRODUODENOSCOPY  04/2015   w/ biopsy, short tongue of Barrett's without obvious nodularity, mass or esophagitis seen   EYE SURGERY Right    cataract with lens implant   FOOT SURGERY     R, for plantar fasciitis   HERNIA REPAIR     x 2 as a child-ing    KNEE ARTHROSCOPY WITH MENISCAL REPAIR Bilateral 05/04/2013   Procedure: BILATERAL KNEE ARTHROSCOPY WITH POSSIBLE CHONDROPLASTY AND MENISCECTOMY ;  Surgeon: Kerin Salen, MD;  Location: [facility];  Service: Orthopedics;  Laterality: Bilateral;  Right chrondroplasty, debridement, , partial medial menisectomy, removal of loose bodies.    LIGATION / DIVISION SAPHENOUS VEIN      both legs   PROSTATE BIOPSY     2010, (-)   TONSILLECTOMY     TOTAL KNEE ARTHROPLASTY Right 11/07/2013   Procedure: TOTAL KNEE ARTHROPLASTY;  Surgeon: Kerin Salen, MD;  Location: [place];  Service: Orthopedics;  Laterality: Right;   TOTAL KNEE ARTHROPLASTY Left 03/01/2014   Procedure: TOTAL KNEE ARTHROPLASTY;  Surgeon: Kerin Salen, MD;  Location: [facility];  Service: Orthopedics;  Laterality: Left;   UPPER GASTROINTESTINAL ENDOSCOPY     UVULOPALATOPHARYNGOPLASTY (UPPP)/TONSILLECTOMY/SEPTOPLASTY  1990    Current Outpatient Medications  Medication Instructions   albuterol (VENTOLIN HFA) 108 (90 Base) MCG/ACT inhaler 2 puffs, Inhalation, Every 6 hours PRN   aspirin 81 mg, Oral, Daily   buPROPion (WELLBUTRIN XL) 300 mg, Oral, Daily   Calcium Carbonate-Vit D-Min (CALCIUM 1200) 1200-1000 MG-UNIT CHEW 1 tablet, Daily   Coenzyme Q10 200 mg, Daily   escitalopram (LEXAPRO) 20 mg, Oral, Daily   ezetimibe (ZETIA) 10 mg, Oral, Daily   gabapentin (NEURONTIN) 800 MG tablet TAKE 1 TABLET(800 MG) BY MOUTH TWICE DAILY   lisinopril (ZESTRIL) 40 mg, Oral, Daily   magnesium oxide (MAG-OX) 400 mg, Daily   Melatonin 10 mg, Daily at bedtime  metoprolol succinate (TOPROL-XL) 150 mg, Oral, Daily, Take with or immediately following a meal.   Multiple Vitamins-Minerals (MULTIVITAMIN WITH MINERALS) tablet 1 tablet, Daily   Omega-3 Fat Ac-Cholecalciferol (FLEX OMEGA BENEFITS/VIT D-3 PO) 2 capsules, Daily   omeprazole (PRILOSEC) 40 mg, Oral, Daily before breakfast   triamcinolone (NASACORT) 55 MCG/ACT AERO nasal inhaler 2 sprays, Daily at bedtime   vitamin C 1,000 mg, Daily   zolpidem (AMBIEN) 10 MG tablet TAKE 1/2 TO 1 TABLET(5 TO 10 MG) BY MOUTH AT BEDTIME AS NEEDED FOR SLEEP       Objective:   Physical Exam BP 138/86   Pulse 78   Temp 98.3 F (36.8 C) (Oral)   Resp 18   Ht '5\' 10"'$  (1.778 m)   Wt (!) 302 lb 4 oz (137.1 kg)   SpO2 94%   BMI 43.37 kg/m  General:   Well developed, NAD, BMI  noted. HEENT:  Normocephalic . Face symmetric, atraumatic Lungs:  CTA B Normal respiratory effort, no intercostal retractions, no accessory muscle use. Heart: RRR,  no murmur.  Lower extremities: Trace pretibial edema bilaterally  Skin: Not pale. Not jaundice Neurologic:  alert & oriented X3.  Speech normal, gait appropriate for age and unassisted Psych--  Cognition and judgment appear intact.  Cooperative with normal attention span and concentration.  Behavior appropriate. No anxious or depressed appearing.      Assessment     Assessment Hyperglycemia HTN Hyperlipidemia: s/e  with simvastatin, declined other statins 08-2019 Chronic lower extremity edema w/ some pretibial redness  (ER 2-17, [place] neg DVT, BNP wnl) anxiety depression, insomnia COPD: Per CT 03-2016 Coronary calcifications per CTA. Morbid obesity GI: GERD, Barrett's  esophagus OSA, BiPAP, per pulmonary GU: Dr Felipa Eth  --LUTS , failed a # of medications --BPH, elevated PSA:  (-)  biopsy 2009, PSA 05/2014 2.92 --ED --Hematuria, CT, cystoscopy 2009 negative --Prostatitis, admitted 03-2020 Vitamin D deficiency  MSK: -- Spinal stenosis : saw neurology w/ sx of paresthesias feet-ankle, sx felt to be d/t spinal stenosis not neuropathy, see OV note 08-07-2012 , used to see pain med (Dr Vira Blanco) -High Risk UDS 2016 --CTS Diastasis recti  PLAN Hyperlipidemia: On Zetia only, previously simvastatin caused s/e.  Has coronary calcification per CT, no CV sxs  Advised a trial with low-dose of a statin, Crestor 5 mg nightly.  He is agreeable.  Work on diet.  Labs in 6 weeks.. Anxiety depression insomnia: Continue Ambien, Lexapro, Wellbutrin.  Contract signed, RF meds prn. Aortic calcification per CT: Echo is a consideration, no murmur on exam, no cardiovascular symptoms.  Discussed with patient above, we agreed on observation. Neuropathy: Self decrease gabapentin to once a day, did not feel it was helping much, he will  considering stop it completely. RTC labs 6 weeks RTC CPX 6 months

## 2021-12-27 ENCOUNTER — Ambulatory Visit
Admission: EM | Admit: 2021-12-27 | Discharge: 2021-12-27 | Disposition: A | Discharge status: Home / Self Care | Payer: Medicare Other | Attending: Urgent Care | Admitting: Urgent Care

## 2021-12-27 ENCOUNTER — Encounter: Payer: Self-pay | Admitting: Emergency Medicine

## 2021-12-27 DIAGNOSIS — R350 Frequency of micturition: Secondary | ICD-10-CM | POA: Insufficient documentation

## 2021-12-27 LAB — POCT URINALYSIS DIP (MANUAL ENTRY)
Bilirubin, UA: NEGATIVE
Glucose, UA: NEGATIVE mg/dL
Ketones, POC UA: NEGATIVE mg/dL
Leukocytes, UA: NEGATIVE
Nitrite, UA: NEGATIVE
Protein Ur, POC: 30 mg/dL — AB
Spec Grav, UA: 1.025 (ref 1.010–1.025)
Urobilinogen, UA: 0.2 E.U./dL
pH, UA: 5.5 (ref 5.0–8.0)

## 2021-12-27 NOTE — Discharge Instructions (Addendum)

## 2021-12-27 NOTE — ED Triage Notes (Signed)
Pt here with foul smelling urine, and increased urgency and freqeuncy x 2 weeks.

## 2021-12-27 NOTE — ED Provider Notes (Signed)
[facility]   MRN: [id number] DOB: 10-Mar-1951  Subjective:   Robert Gibbs is a 71 y.o. male presenting for 2-week history of recurrent malodorous urine, increased urinary frequency and urgency.  Patient has an urologist and gets regular follow-up with them but they could not see him for this particular episode.  He has a history of UTIs.  Has previously gotten antibiotics from his PCP but then when he goes to the urologist they advised against it stating he does not have a UTI.  Denies fever, nausea, vomiting, pelvic pain, dysuria.  He does have a history of hematuria and is aware of that.  Has a history of BPH.  No history of CKD.  He has had a remote history of a kidney stone.  No current facility-administered medications for this encounter.  Current Outpatient Medications:    albuterol (VENTOLIN HFA) 108 (90 Base) MCG/ACT inhaler, Inhale 2 puffs into the lungs every 6 (six) hours as needed for wheezing or shortness of breath., Disp: 1 Inhaler, Rfl: 1   Ascorbic Acid (VITAMIN C) 1000 MG tablet, Take 1,000 mg by mouth daily., Disp: , Rfl:    aspirin 81 MG tablet, Take 81 mg by mouth daily., Disp: , Rfl:    buPROPion (WELLBUTRIN XL) 300 MG 24 hr tablet, Take 1 tablet (300 mg total) by mouth daily., Disp: 90 tablet, Rfl: 1   Calcium Carbonate-Vit D-Min (CALCIUM 1200) 1200-1000 MG-UNIT CHEW, Chew 1 tablet by mouth daily. , Disp: , Rfl:    Coenzyme Q10 200 MG capsule, Take 200 mg by mouth daily., Disp: , Rfl:    escitalopram (LEXAPRO) 20 MG tablet, Take 1 tablet (20 mg total) by mouth daily., Disp: 90 tablet, Rfl: 1   ezetimibe (ZETIA) 10 MG tablet, Take 1 tablet (10 mg total) by mouth daily., Disp: 90 tablet, Rfl: 1   gabapentin (NEURONTIN) 800 MG tablet, Take 1 tablet (800 mg total) by mouth at bedtime., Disp: , Rfl:    lisinopril (ZESTRIL) 40 MG tablet, Take 1 tablet (40 mg total) by mouth daily., Disp: 90 tablet, Rfl: 1   magnesium oxide (MAG-OX) 400 MG tablet,  Take 400 mg by mouth daily., Disp: , Rfl:    Melatonin 5 MG CAPS, Take 10 mg by mouth at bedtime. Patient stated that he is taking 10 mg daily, Disp: , Rfl:    metoprolol succinate (TOPROL-XL) 100 MG 24 hr tablet, Take 1.5 tablets (150 mg total) by mouth daily. Take with or immediately following a meal., Disp: 135 tablet, Rfl: 1   Multiple Vitamins-Minerals (MULTIVITAMIN WITH MINERALS) tablet, Take 1 tablet by mouth daily., Disp: , Rfl:    Omega-3 Fat Ac-Cholecalciferol (FLEX OMEGA BENEFITS/VIT D-3 PO), Take 2 capsules by mouth daily., Disp: , Rfl:    omeprazole (PRILOSEC) 40 MG capsule, Take 1 capsule (40 mg total) by mouth daily before breakfast., Disp: 90 capsule, Rfl: 1   rosuvastatin (CRESTOR) 5 MG tablet, Take 1 tablet (5 mg total) by mouth at bedtime., Disp: 90 tablet, Rfl: 1   triamcinolone (NASACORT) 55 MCG/ACT AERO nasal inhaler, Place 2 sprays into the nose at bedtime., Disp: , Rfl:    zolpidem (AMBIEN) 10 MG tablet, TAKE 1/2 TO 1 TABLET(5 TO 10 MG) BY MOUTH AT BEDTIME AS NEEDED FOR SLEEP, Disp: 30 tablet, Rfl: 5   Allergies  Allergen Reactions   Codeine Itching   Nsaids     Acid Reflux   Simvastatin     Mimics heart attack  Tolmetin     Acid Reflux    Past Medical History:  Diagnosis Date   Anxiety and depression    Arthritis    Barrett esophagus    BPH (benign prostatic hyperplasia)    CTS (carpal tunnel syndrome)    h/o   Edema of both legs    legs , ankles and feet   Erectile dysfunction    GERD (gastroesophageal reflux disease) 03/18/2012   H/O hiatal hernia    HTN (hypertension)    Hyperlipidemia    Microscopic hematuria    Neuropathy    OSA (obstructive sleep apnea) 03/18/2012   on BiPap   Prostatitis    PVD (peripheral vascular disease) ([place])    Spinal stenosis 06-2012   saw neuro w/ "neuropathy", dx w/ spinal stenosis   Varicose veins    Vitamin D deficiency    Wears glasses      Past Surgical History:  Procedure Laterality Date   ARCH BAR REMOVAL      BACK SURGERY  2002   L4-L5   ELBOW SURGERY  1992   for tennis elbow-rt   ESOPHAGOGASTRODUODENOSCOPY  04/2015   w/ biopsy, short tongue of Barrett's without obvious nodularity, mass or esophagitis seen   EYE SURGERY Right    cataract with lens implant   FOOT SURGERY     R, for plantar fasciitis   HERNIA REPAIR     x 2 as a child-ing    KNEE ARTHROSCOPY WITH MENISCAL REPAIR Bilateral 05/04/2013   Procedure: BILATERAL KNEE ARTHROSCOPY WITH POSSIBLE CHONDROPLASTY AND MENISCECTOMY ;  Surgeon: Kerin Salen, MD;  Location: [facility];  Service: Orthopedics;  Laterality: Bilateral;  Right chrondroplasty, debridement, , partial medial menisectomy, removal of loose bodies.    LIGATION / DIVISION SAPHENOUS VEIN     both legs   PROSTATE BIOPSY     2010, (-)   TONSILLECTOMY     TOTAL KNEE ARTHROPLASTY Right 11/07/2013   Procedure: TOTAL KNEE ARTHROPLASTY;  Surgeon: Kerin Salen, MD;  Location: [facility];  Service: Orthopedics;  Laterality: Right;   TOTAL KNEE ARTHROPLASTY Left 03/01/2014   Procedure: TOTAL KNEE ARTHROPLASTY;  Surgeon: Kerin Salen, MD;  Location: [facility];  Service: Orthopedics;  Laterality: Left;   UPPER GASTROINTESTINAL ENDOSCOPY     UVULOPALATOPHARYNGOPLASTY (UPPP)/TONSILLECTOMY/SEPTOPLASTY  1990    Family History  Problem Relation Age of Onset   CAD Father        ?   Alzheimer's disease Mother    Stroke Brother    Colon cancer Neg Hx    Prostate cancer Neg Hx    Diabetes Neg Hx    Cancer Neg Hx     Social History   Tobacco Use   Smoking status: Former    Packs/day: 1.00    Years: 40.00    Total pack years: 40.00    Types: Cigarettes    Start date: 9    Quit date: 06/29/2008    Years since quitting: 13.5   Smokeless tobacco: Never   Tobacco comments:    quit 2010  Substance Use Topics   Alcohol use: Yes    Alcohol/week: 3.0 standard drinks of alcohol    Types: 3 Standard drinks or equivalent per week    Comment: 2-3 large drinks, Vodka,  1-2 times a week   Drug use: No    ROS   Objective:   Vitals: BP (!) 154/79   Pulse (!) 59   Temp 97.9 F (36.6  C)   Resp 20   SpO2 92%   Physical Exam Constitutional:      General: He is not in acute distress.    Appearance: Normal appearance. He is well-developed and normal weight. He is not ill-appearing, toxic-appearing or diaphoretic.  HENT:     Head: Normocephalic and atraumatic.     Right Ear: External ear normal.     Left Ear: External ear normal.     Nose: Nose normal.     Mouth/Throat:     Pharynx: Oropharynx is clear.  Eyes:     General: No scleral icterus.       Right eye: No discharge.        Left eye: No discharge.     Extraocular Movements: Extraocular movements intact.  Cardiovascular:     Rate and Rhythm: Normal rate.  Pulmonary:     Effort: Pulmonary effort is normal.  Musculoskeletal:     Cervical back: Normal range of motion.  Neurological:     Mental Status: He is alert and oriented to person, place, and time.  Psychiatric:        Mood and Affect: Mood normal.        Behavior: Behavior normal.        Thought Content: Thought content normal.        Judgment: Judgment normal.     Results for orders placed or performed during the hospital encounter of 12/27/21 (from the past 24 hour(s))  POCT urinalysis dipstick     Status: Abnormal   Collection Time: 12/27/21  2:21 PM  Result Value Ref Range   Color, UA yellow yellow   Clarity, UA clear clear   Glucose, UA negative negative mg/dL   Bilirubin, UA negative negative   Ketones, POC UA negative negative mg/dL   Spec Grav, UA 1.025 1.010 - 1.025   Blood, UA large (A) negative   pH, UA 5.5 5.0 - 8.0   Protein Ur, POC =30 (A) negative mg/dL   Urobilinogen, UA 0.2 0.2 or 1.0 E.U./dL   Nitrite, UA Negative Negative   Leukocytes, UA Negative Negative    Assessment and Plan :   PDMP not reviewed this encounter.  1. Urinary frequency    Urinalysis negative in clinic for an urinary tract  infection, urine culture pending.  Recommended aggressive hydration, limiting urinary irritants.  We will treat based off of the urine culture results in an effort to avoid antibiotic overuse, patient was agreeable.  Counseled patient on potential for adverse effects with medications prescribed/recommended today, ER and return-to-clinic precautions discussed, patient verbalized understanding.    Jaynee Eagles, [place] 12/27/21 1445

## 2021-12-29 LAB — URINE CULTURE: Culture: 10000 — AB

## 2021-12-30 ENCOUNTER — Telehealth: Payer: Self-pay

## 2021-12-30 ENCOUNTER — Telehealth (HOSPITAL_COMMUNITY): Payer: Self-pay | Admitting: Emergency Medicine

## 2021-12-30 MED ORDER — NITROFURANTOIN MONOHYD MACRO 100 MG PO CAPS: 100.0000 mg | ORAL_CAPSULE | Freq: Two times a day (BID) | ORAL | 0 refills | Status: DC

## 2021-12-30 NOTE — Telephone Encounter (Signed)
Patient verification complete (name and date of birth).    Returned patient call about medication that was prescribed (Macrobid), & all questions answered.

## 2022-01-08 ENCOUNTER — Other Ambulatory Visit: Payer: Medicare Other

## 2022-01-20 ENCOUNTER — Other Ambulatory Visit: Payer: Medicare Other

## 2022-04-08 ENCOUNTER — Telehealth: Payer: Self-pay | Admitting: Internal Medicine

## 2022-04-08 NOTE — Telephone Encounter (Signed)
PDMP okay, Rx sent 

## 2022-04-08 NOTE — Telephone Encounter (Signed)
Requesting: Ambien '10mg'$   Contract: 11/27/21 UDS: None Last Visit: 11/27/21 Next Visit: 06/17/22 Last Refill: 05/29/21 #30 and 5RF  Please Advise

## 2022-04-28 DIAGNOSIS — Z23 Encounter for immunization: Secondary | ICD-10-CM | POA: Diagnosis not present

## 2022-05-02 ENCOUNTER — Telehealth: Payer: Self-pay | Admitting: Internal Medicine

## 2022-05-02 NOTE — Telephone Encounter (Signed)
Immunization record updated per [facility]. Mychart message sent to Pt regarding other recommended vaccines.

## 2022-05-02 NOTE — Telephone Encounter (Signed)
Patient called stating he recently got his flu shot and covid booster, he would like to know if there are other shots he needs to get. Please advise.

## 2022-05-30 ENCOUNTER — Encounter: Payer: Medicare Other | Admitting: Internal Medicine

## 2022-06-17 ENCOUNTER — Ambulatory Visit (INDEPENDENT_AMBULATORY_CARE_PROVIDER_SITE_OTHER): Payer: Medicare Other | Admitting: Internal Medicine

## 2022-06-17 VITALS — BP 162/88 | HR 100 | Temp 97.8°F | Resp 18 | Ht 70.0 in | Wt 293.0 lb

## 2022-06-17 DIAGNOSIS — I10 Essential (primary) hypertension: Secondary | ICD-10-CM

## 2022-06-17 DIAGNOSIS — N401 Enlarged prostate with lower urinary tract symptoms: Secondary | ICD-10-CM

## 2022-06-17 DIAGNOSIS — G4733 Obstructive sleep apnea (adult) (pediatric): Secondary | ICD-10-CM | POA: Diagnosis not present

## 2022-06-17 DIAGNOSIS — R0601 Orthopnea: Secondary | ICD-10-CM

## 2022-06-17 DIAGNOSIS — R739 Hyperglycemia, unspecified: Secondary | ICD-10-CM

## 2022-06-17 DIAGNOSIS — Z0000 Encounter for general adult medical examination without abnormal findings: Secondary | ICD-10-CM

## 2022-06-17 DIAGNOSIS — E785 Hyperlipidemia, unspecified: Secondary | ICD-10-CM

## 2022-06-17 DIAGNOSIS — E559 Vitamin D deficiency, unspecified: Secondary | ICD-10-CM

## 2022-06-17 DIAGNOSIS — Z0001 Encounter for general adult medical examination with abnormal findings: Secondary | ICD-10-CM

## 2022-06-17 MED ORDER — METOPROLOL SUCCINATE ER 100 MG PO TB24: 200.0000 mg | ORAL_TABLET | Freq: Every day | ORAL | Status: DC

## 2022-06-17 NOTE — Patient Instructions (Addendum)
Take lisinopril every morning  Metoprolol 100 mg: Take 2 tablets at bedtime  Check the  blood pressure regularly BP GOAL is between 110/65 and  135/85. If it is consistently higher or lower, let me know  Referrals Urology BiPAP Doctor.  GO TO THE LAB : Get the blood work     GO TO THE FRONT DESK, [place] Come back for checkup in 1 months

## 2022-06-17 NOTE — Progress Notes (Unsigned)
Subjective:    Patient ID: Robert Gibbs, male    DOB: 1950-06-24, 71 y.o.   MRN: [id number]  DOS:  06/17/2022 Type of visit - description: CPX  Here for CPX In general feeling well. BP is elevated, no recent ambulatory BPs. His fiance noted that he dozes off really easy.  On further questioning, the patient admits that she feels sleepy frequently, she has not been able to sleep on the bed for years, whether he sleeps in a chair. When he lays down flat in bed she get anxious and somewhat short of breath. He has mild lower extremity edema.  He specifically denies fever or chills No chest pain No nausea vomiting.  No blood in the stools No blood in the urine but still have some LUTS.   Review of Systems See above   Past Medical History:  Diagnosis Date   Anxiety and depression    Arthritis    Barrett esophagus    BPH (benign prostatic hyperplasia)    CTS (carpal tunnel syndrome)    h/o   Edema of both legs    legs , ankles and feet   Erectile dysfunction    GERD (gastroesophageal reflux disease) 03/18/2012   H/O hiatal hernia    HTN (hypertension)    Hyperlipidemia    Microscopic hematuria    Neuropathy    OSA (obstructive sleep apnea) 03/18/2012   on BiPap   Prostatitis    PVD (peripheral vascular disease) ([place])    Spinal stenosis 06-2012   saw neuro w/ "neuropathy", dx w/ spinal stenosis   Varicose veins    Vitamin D deficiency    Wears glasses     Past Surgical History:  Procedure Laterality Date   ARCH BAR REMOVAL     BACK SURGERY  2002   L4-L5   ELBOW SURGERY  1992   for tennis elbow-rt   ESOPHAGOGASTRODUODENOSCOPY  04/2015   w/ biopsy, short tongue of Barrett's without obvious nodularity, mass or esophagitis seen   EYE SURGERY Right    cataract with lens implant   FOOT SURGERY     R, for plantar fasciitis   HERNIA REPAIR     x 2 as a child-ing    KNEE ARTHROSCOPY WITH MENISCAL REPAIR Bilateral 05/04/2013   Procedure: BILATERAL KNEE ARTHROSCOPY  WITH POSSIBLE CHONDROPLASTY AND MENISCECTOMY ;  Surgeon: Kerin Salen, MD;  Location: [facility];  Service: Orthopedics;  Laterality: Bilateral;  Right chrondroplasty, debridement, , partial medial menisectomy, removal of loose bodies.    LIGATION / DIVISION SAPHENOUS VEIN     both legs   PROSTATE BIOPSY     2010, (-)   TONSILLECTOMY     TOTAL KNEE ARTHROPLASTY Right 11/07/2013   Procedure: TOTAL KNEE ARTHROPLASTY;  Surgeon: Kerin Salen, MD;  Location: [facility];  Service: Orthopedics;  Laterality: Right;   TOTAL KNEE ARTHROPLASTY Left 03/01/2014   Procedure: TOTAL KNEE ARTHROPLASTY;  Surgeon: Kerin Salen, MD;  Location: [facility];  Service: Orthopedics;  Laterality: Left;   UPPER GASTROINTESTINAL ENDOSCOPY     UVULOPALATOPHARYNGOPLASTY (UPPP)/TONSILLECTOMY/SEPTOPLASTY  1990    Current Outpatient Medications  Medication Instructions   albuterol (VENTOLIN HFA) 108 (90 Base) MCG/ACT inhaler 2 puffs, Inhalation, Every 6 hours PRN   aspirin 81 mg, Oral, Daily   buPROPion (WELLBUTRIN XL) 300 mg, Oral, Daily   Calcium Carbonate-Vit D-Min (CALCIUM 1200) 1200-1000 MG-UNIT CHEW 1 tablet, Daily   Coenzyme Q10 200 mg, Daily   escitalopram (LEXAPRO)  20 mg, Oral, Daily   ezetimibe (ZETIA) 10 mg, Oral, Daily   gabapentin (NEURONTIN) 800 mg, Oral, Daily at bedtime   lisinopril (ZESTRIL) 40 mg, Oral, Daily   magnesium oxide (MAG-OX) 400 mg, Daily   Melatonin 10 mg, Daily at bedtime   metoprolol succinate (TOPROL-XL) 150 mg, Oral, Daily, Take with or immediately following a meal.   Multiple Vitamins-Minerals (MULTIVITAMIN WITH MINERALS) tablet 1 tablet, Daily   nitrofurantoin (macrocrystal-monohydrate) (MACROBID) 100 mg, Oral, 2 times daily   Omega-3 Fat Ac-Cholecalciferol (FLEX OMEGA BENEFITS/VIT D-3 PO) 2 capsules, Daily   omeprazole (PRILOSEC) 40 mg, Oral, Daily before breakfast   rosuvastatin (CRESTOR) 5 mg, Oral, Daily at bedtime   triamcinolone (NASACORT) 55 MCG/ACT AERO nasal inhaler  2 sprays, Daily at bedtime   vitamin C 1,000 mg, Daily   zolpidem (AMBIEN) 10 MG tablet TAKE 1/2 TO 1 TABLET(5 TO 10 MG) BY MOUTH AT BEDTIME AS NEEDED FOR SLEEP       Objective:   Physical Exam BP (!) 168/80   Pulse 100   Temp 97.8 F (36.6 C)   Resp 18   Ht '5\' 10"'$  (1.778 m)   Wt 293 lb (132.9 kg)   SpO2 97%   BMI 42.04 kg/m  General: Well developed, NAD, BMI noted Neck: No  thyromegaly.  No JVD at 45 degrees HEENT:  Normocephalic . Face symmetric, atraumatic Lungs:  CTA B Normal respiratory effort, no intercostal retractions, no accessory muscle use. Heart: RRR,  no murmur.  Abdomen:  Not distended, soft, non-tender. No rebound or rigidity.   Lower extremities: trace pretibial edema bilaterally  Skin: Exposed areas without rash. Not pale. Not jaundice Neurologic:  alert & oriented X3.  Speech normal, gait appropriate for age and unassisted Strength symmetric and appropriate for age.  Psych: Cognition and judgment appear intact.  Cooperative with normal attention span and concentration.  Behavior appropriate. No anxious or depressed appearing.     Assessment      Assessment Hyperglycemia HTN Hyperlipidemia: s/e  with simvastatin, declined other statins 08-2019 Chronic lower extremity edema w/ some pretibial redness  (ER 2-17, [place] neg DVT, BNP wnl) anxiety depression, insomnia COPD: Per CT 03-2016 Coronary calcifications per CTA. Morbid obesity GI: GERD, Barrett's  esophagus OSA, BiPAP, per pulmonary GU: Dr Felipa Eth  --LUTS , failed a # of medications --BPH, elevated PSA:  (-)  biopsy 2009, PSA 05/2014 2.92 --ED --Hematuria, CT, cystoscopy 2009 negative --Prostatitis, admitted 03-2020 Vitamin D deficiency  MSK: -- Spinal stenosis : saw neurology w/ sx of paresthesias feet-ankle, sx felt to be d/t spinal stenosis not neuropathy, see OV note 08-07-2012 , used to see pain med (Dr Vira Blanco) -High Risk UDS 2016 --CTS Diastasis recti  PLAN CMP, FLP CBC A1c  PSA UA urine culture Urology referral Pulmonary referral  Here for CPX Orthopnea: The patient has not been able to sleep flat in bed for years, he sleeps on a chair.  When he lays down he gets anxious and somewhat short of breath.  Unclear on clinical grounds if that truly represents orthopnea. EKG today: RBBB, similar to previous EKGs. Plan: BNP, echo, reassess in 4 weeks  Hyperglycemia: Check A1c HTN: On lisinopril 40 mg, metoprolol 100 mg 1.5 tablets daily, BP is elevated today, no ambulatory BPs, has not clinically medications today.  Plan: Lisinopril every morning, increase metoprolol 100 mg 2 tablets daily at night. BPH, chronic LUTS: UTI Dx 12/27/2021, urine culture Enterococcus.  Treated with Macrobid.  Symptoms at baseline, last PSA  elevated. Plan: PSA, UA, urine culture, urology referral COPD: Seems well-controlled. High cholesterol coronary calcifications: Controlling CV RF, last FLP not at goal, on Zetia, rosuvastatin added, check FLP. OSA: Refer to pulmonary. Not using Bipap Vitamin D deficiency: Checking levels.  On OTCs. RTC 1 month  - Tdap 2014 -  pnm 23:  2014 and 2023 ; prevnar-- 2015 - zostavax  2014   - shingrix x1, booster rec  - RSV: d/w pt - covid vax: UTD -  got a flu shot  -CCS: cscope 01-2012, bx neg per old records; [place] 11/2018, @ [facility], next 7 years -Prostate ca screening: LUTS at baseline, refer back to Dr. Felipa Eth, check a PSA UA urine culture -Lung cancer screening:  CT 03-2016 (+ COPD).  CT 09/12/2021: COPD, aortic sclerosis, Ca + at the coronaries. -Labs: See orders RTC  ===== Hyperlipidemia: On Zetia only, previously simvastatin caused s/e.  Has coronary calcification per CT, no CV sxs  Advised a trial with low-dose of a statin, Crestor 5 mg nightly.  He is agreeable.  Work on diet.  Labs in 6 weeks.. Anxiety depression insomnia: Continue Ambien, Lexapro, Wellbutrin.  Contract signed, RF meds prn. Aortic calcification per CT: Echo is a  consideration, no murmur on exam, no cardiovascular symptoms.  Discussed with patient above, we agreed on observation. Neuropathy: Self decrease gabapentin to once a day, did not feel it was helping much, he will considering stop it completely. RTC labs 6 weeks RTC CPX 6 months

## 2022-06-18 ENCOUNTER — Encounter: Payer: Self-pay | Admitting: Internal Medicine

## 2022-06-18 LAB — URINALYSIS, ROUTINE W REFLEX MICROSCOPIC
Ketones, ur: NEGATIVE
Leukocytes,Ua: NEGATIVE
Nitrite: NEGATIVE
Specific Gravity, Urine: >=1.03 — AB (ref 1.000–1.030)
Total Protein, Urine: 100 — AB
Urine Glucose: NEGATIVE
Urobilinogen, UA: 0.2 (ref 0.0–1.0)
pH: 5.5 (ref 5.0–8.0)

## 2022-06-18 LAB — LIPID PANEL
Cholesterol: 152 mg/dL (ref 0–200)
HDL: 55.1 mg/dL (ref >39.00)
LDL Cholesterol: 79 mg/dL (ref 0–99)
NonHDL: 96.85
Total CHOL/HDL Ratio: 3
Triglycerides: 90 mg/dL (ref 0.0–149.0)
VLDL: 18 mg/dL (ref 0.0–40.0)

## 2022-06-18 LAB — CBC WITH DIFFERENTIAL/PLATELET
Basophils Absolute: 0.1 10*3/uL (ref 0.0–0.1)
Basophils Relative: 0.9 % (ref 0.0–3.0)
Eosinophils Absolute: 0.1 10*3/uL (ref 0.0–0.7)
Eosinophils Relative: 2.1 % (ref 0.0–5.0)
HCT: 47.6 % (ref 39.0–52.0)
Hemoglobin: 16.3 g/dL (ref 13.0–17.0)
Lymphocytes Relative: 25.4 % (ref 12.0–46.0)
Lymphs Abs: 1.7 10*3/uL (ref 0.7–4.0)
MCHC: 34.2 g/dL (ref 30.0–36.0)
MCV: 97.6 fl (ref 78.0–100.0)
Monocytes Absolute: 0.5 10*3/uL (ref 0.1–1.0)
Monocytes Relative: 8.1 % (ref 3.0–12.0)
Neutro Abs: 4.2 10*3/uL (ref 1.4–7.7)
Neutrophils Relative %: 63.5 % (ref 43.0–77.0)
Platelets: 225 10*3/uL (ref 150.0–400.0)
RBC: 4.88 Mil/uL (ref 4.22–5.81)
RDW: 13.4 % (ref 11.5–15.5)
WBC: 6.7 10*3/uL (ref 4.0–10.5)

## 2022-06-18 LAB — COMPREHENSIVE METABOLIC PANEL
ALT: 20 U/L (ref 0–53)
AST: 16 U/L (ref 0–37)
Albumin: 4.6 g/dL (ref 3.5–5.2)
Alkaline Phosphatase: 66 U/L (ref 39–117)
BUN: 10 mg/dL (ref 6–23)
CO2: 24 mEq/L (ref 19–32)
Calcium: 9 mg/dL (ref 8.4–10.5)
Chloride: 104 mEq/L (ref 96–112)
Creatinine, Ser: 0.8 mg/dL (ref 0.40–1.50)
GFR: 88.94 mL/min (ref >60.00)
Glucose, Bld: 114 mg/dL — ABNORMAL HIGH (ref 70–99)
Potassium: 3.8 mEq/L (ref 3.5–5.1)
Sodium: 140 mEq/L (ref 135–145)
Total Bilirubin: 0.6 mg/dL (ref 0.2–1.2)
Total Protein: 7 g/dL (ref 6.0–8.3)

## 2022-06-18 LAB — HEMOGLOBIN A1C: Hgb A1c MFr Bld: 5.6 % (ref 4.6–6.5)

## 2022-06-18 LAB — VITAMIN D 25 HYDROXY (VIT D DEFICIENCY, FRACTURES): VITD: 21.03 ng/mL — ABNORMAL LOW (ref 30.00–100.00)

## 2022-06-18 LAB — BRAIN NATRIURETIC PEPTIDE: Pro B Natriuretic peptide (BNP): 21 pg/mL (ref 0.0–100.0)

## 2022-06-18 LAB — PSA: PSA: 9.94 ng/mL — ABNORMAL HIGH (ref 0.10–4.00)

## 2022-06-18 NOTE — Assessment & Plan Note (Signed)
-   Tdap 2014 -  pnm 23:  2014 and 2023 ; prevnar-- 2015 - zostavax  2014   - shingrix x1, booster rec  - covid vax: UTD -  got a flu shot  -CCS: cscope 01-2012, bx neg per old records; [place] 11/2018, @ [facility], next 7 years -Prostate ca screening: LUTS at baseline, refer back to Dr. Felipa Eth, check a PSA UA urine culture -Lung cancer screening:  CT 03-2016 (+ COPD).  CT 09/12/2021: COPD, aortic sclerosis, Ca + at the coronaries. -Labs: See orders

## 2022-06-18 NOTE — Assessment & Plan Note (Signed)
Here for CPX, multiple other issues discussed. Orthopnea: The patient has not been able to sleep flat in bed for years, he sleeps on a chair.  When he lays down he gets anxious and somewhat short of breath.  Unclear on clinical grounds if that truly represents orthopnea. EKG today: RBBB, similar to previous EKGs. Plan: BNP, echo, reassess in 4 weeks Hyperglycemia: Check A1c HTN: On lisinopril 40 mg, metoprolol 100 mg 1.5 tablets daily, BP is elevated today, no ambulatory BPs, has not taken meds  today.  Plan: Lisinopril every morning, increase metoprolol 100 mg 2 tablets daily at night.  Monitor BPs BPH, chronic LUTS: UTI Dx 12/27/2021, urine culture Enterococcus.  Treated with Macrobid.  Symptoms at baseline, last PSA elevated. Plan: PSA, UA, urine culture, urology referral entheses he is established the patient) COPD: Seems well-controlled. High cholesterol coronary calcifications: Controlling CV RF, last FLP not at goal, on Zetia, rosuvastatin added, check FLP. OSA: Refer to pulmonary. Not using Bipap regularly.  On chart review previously CPAP intolerant Vitamin D deficiency: Checking levels.  On OTCs. RTC 1 month

## 2022-06-20 LAB — URINE CULTURE
MICRO NUMBER:: 14356999
SPECIMEN QUALITY:: ADEQUATE

## 2022-06-20 MED ORDER — ROSUVASTATIN CALCIUM 10 MG PO TABS: 10.0000 mg | ORAL_TABLET | Freq: Every day | ORAL | 1 refills | Status: DC

## 2022-06-20 MED ORDER — VITAMIN D (ERGOCALCIFEROL) 1.25 MG (50000 UNIT) PO CAPS: 50000.0000 [IU] | ORAL_CAPSULE | ORAL | 0 refills | Status: DC

## 2022-06-20 NOTE — Addendum Note (Signed)
Addended byDamita Dunnings D on: 06/20/2022 09:51 AM   Modules accepted: Orders

## 2022-06-22 ENCOUNTER — Other Ambulatory Visit: Payer: Self-pay | Admitting: Internal Medicine

## 2022-07-16 ENCOUNTER — Telehealth: Payer: Self-pay | Admitting: Internal Medicine

## 2022-07-16 NOTE — Telephone Encounter (Signed)
Copied from [place] [id number]. Topic: Medicare AWV >> Jul 16, 2022  2:54 PM Devoria Glassing wrote: Reason for CRM: Left message for patient to schedule Annual Wellness Visit(AWV).  Please schedule with Health Nurse Advisor at [facility]. Please call [phone] ask for [facility].

## 2022-07-18 ENCOUNTER — Encounter: Payer: Self-pay | Admitting: Internal Medicine

## 2022-07-18 ENCOUNTER — Ambulatory Visit (INDEPENDENT_AMBULATORY_CARE_PROVIDER_SITE_OTHER): Payer: Medicare Other | Admitting: Internal Medicine

## 2022-07-18 VITALS — BP 134/80 | HR 54 | Temp 98.0°F | Resp 18 | Ht 70.0 in | Wt 299.0 lb

## 2022-07-18 DIAGNOSIS — E785 Hyperlipidemia, unspecified: Secondary | ICD-10-CM | POA: Diagnosis not present

## 2022-07-18 DIAGNOSIS — N401 Enlarged prostate with lower urinary tract symptoms: Secondary | ICD-10-CM

## 2022-07-18 DIAGNOSIS — G4733 Obstructive sleep apnea (adult) (pediatric): Secondary | ICD-10-CM | POA: Diagnosis not present

## 2022-07-18 NOTE — Patient Instructions (Addendum)
Vaccines I recommend:  Shingrix (shingles) #2 Tdap (tetanus)  Please call  Pulmonary to schedule your appointment- [phone].  Please call [facility] at [phone] to schedule your appointment.   Start Ergocalciferol 50,000 units once weekly, this was sent to your pharmacy back in December.   Start rosuvastatin '10mg'$  1 tablet by mouth daily at bedtime, this was also sent to your pharmacy back in December.  Continue Zetia  Schedule an appointment 1 month from today.

## 2022-07-18 NOTE — Progress Notes (Unsigned)
Subjective:    Patient ID: Robert Gibbs, male    DOB: [birth year], 72 y.o.   MRN: [id number]  DOS:  07/18/2022 Type of visit - description: Follow-up  Since the last office visit, he did not review mychart. Has not have an echo or seen urology.  Review of Systems See above   Past Medical History:  Diagnosis Date   Anxiety and depression    Arthritis    Barrett esophagus    BPH (benign prostatic hyperplasia)    CTS (carpal tunnel syndrome)    h/o   Edema of both legs    legs , ankles and feet   Erectile dysfunction    GERD (gastroesophageal reflux disease) 03/18/2012   H/O hiatal hernia    HTN (hypertension)    Hyperlipidemia    Microscopic hematuria    Neuropathy    OSA (obstructive sleep apnea) 03/18/2012   on BiPap   Prostatitis    PVD (peripheral vascular disease) ([place])    Spinal stenosis 06-2012   saw neuro w/ "neuropathy", dx w/ spinal stenosis   Varicose veins    Vitamin D deficiency    Wears glasses     Past Surgical History:  Procedure Laterality Date   ARCH BAR REMOVAL     BACK SURGERY  2002   L4-L5   ELBOW SURGERY  1992   for tennis elbow-rt   ESOPHAGOGASTRODUODENOSCOPY  04/2015   w/ biopsy, short tongue of Barrett's without obvious nodularity, mass or esophagitis seen   EYE SURGERY Right    cataract with lens implant   FOOT SURGERY     R, for plantar fasciitis   HERNIA REPAIR     x 2 as a child-ing    KNEE ARTHROSCOPY WITH MENISCAL REPAIR Bilateral 05/04/2013   Procedure: BILATERAL KNEE ARTHROSCOPY WITH POSSIBLE CHONDROPLASTY AND MENISCECTOMY ;  Surgeon: Kerin Salen, MD;  Location: [facility];  Service: Orthopedics;  Laterality: Bilateral;  Right chrondroplasty, debridement, , partial medial menisectomy, removal of loose bodies.    LIGATION / DIVISION SAPHENOUS VEIN     both legs   PROSTATE BIOPSY     2010, (-)   TONSILLECTOMY     TOTAL KNEE ARTHROPLASTY Right 11/07/2013   Procedure: TOTAL KNEE ARTHROPLASTY;  Surgeon: Kerin Salen, MD;  Location: [facility];  Service: Orthopedics;  Laterality: Right;   TOTAL KNEE ARTHROPLASTY Left 03/01/2014   Procedure: TOTAL KNEE ARTHROPLASTY;  Surgeon: Kerin Salen, MD;  Location: [place];  Service: Orthopedics;  Laterality: Left;   UPPER GASTROINTESTINAL ENDOSCOPY     UVULOPALATOPHARYNGOPLASTY (UPPP)/TONSILLECTOMY/SEPTOPLASTY  1990    Current Outpatient Medications  Medication Instructions   albuterol (VENTOLIN HFA) 108 (90 Base) MCG/ACT inhaler 2 puffs, Inhalation, Every 6 hours PRN   aspirin 81 mg, Oral, Daily   buPROPion (WELLBUTRIN XL) 300 mg, Oral, Daily   Calcium Carbonate-Vit D-Min (CALCIUM 1200) 1200-1000 MG-UNIT CHEW 1 tablet, Daily   Coenzyme Q10 200 mg, Daily   escitalopram (LEXAPRO) 20 mg, Oral, Daily   ezetimibe (ZETIA) 10 MG tablet TAKE 1 TABLET(10 MG) BY MOUTH DAILY   gabapentin (NEURONTIN) 800 mg, Oral, Daily at bedtime   lisinopril (ZESTRIL) 40 mg, Oral, Daily   magnesium oxide (MAG-OX) 400 mg, Daily   Melatonin 10 mg, Daily at bedtime   metoprolol succinate (TOPROL-XL) 200 mg, Oral, Daily, Take with or immediately following a meal.   Multiple Vitamins-Minerals (MULTIVITAMIN WITH MINERALS) tablet 1 tablet, Daily   Omega-3 Fat Ac-Cholecalciferol (FLEX OMEGA  BENEFITS/VIT D-3 PO) 2 capsules, Daily   omeprazole (PRILOSEC) 40 mg, Oral, Daily before breakfast   rosuvastatin (CRESTOR) 10 mg, Oral, Daily at bedtime   triamcinolone (NASACORT) 55 MCG/ACT AERO nasal inhaler 2 sprays, Daily at bedtime   vitamin C 1,000 mg, Daily   Vitamin D (Ergocalciferol) (DRISDOL) 50,000 Units, Oral, Every 7 days   zolpidem (AMBIEN) 10 MG tablet TAKE 1/2 TO 1 TABLET(5 TO 10 MG) BY MOUTH AT BEDTIME AS NEEDED FOR SLEEP       Objective:   Physical Exam BP 134/80   Pulse (!) 54   Temp 98 F (36.7 C) (Oral)   Resp 18   Ht '5\' 10"'$  (1.778 m)   Wt 299 lb (135.6 kg)   SpO2 97%   BMI 42.90 kg/m  General:   Well developed, NAD, BMI noted. HEENT:  Normocephalic . Face symmetric,  atraumatic Skin: Not pale. Not jaundice Neurologic:  alert & oriented X3.  Speech normal, gait appropriate for age and unassisted Psych--  Cognition and judgment appear intact.  Cooperative with normal attention span and concentration.  Behavior appropriate. No anxious or depressed appearing.      Assessment    Assessment Hyperglycemia HTN Hyperlipidemia: s/e  with simvastatin, declined other statins 08-2019 Chronic lower extremity edema w/ some pretibial redness  (ER 2-17, [place] neg DVT, BNP wnl) anxiety depression, insomnia COPD: Per CT 03-2016 Coronary calcifications per CTA. Morbid obesity GI: GERD, Barrett's  esophagus OSA, BiPAP, per pulmonary GU: Dr Felipa Eth  --LUTS , failed a # of medications --BPH, elevated PSA:  (-)  biopsy 2009, PSA 05/2014 2.92 --ED --Hematuria, CT, cystoscopy 2009 negative --Prostatitis, admitted 03-2020 Vitamin D deficiency  MSK: -- Spinal stenosis : saw neurology w/ sx of paresthesias feet-ankle, sx felt to be d/t spinal stenosis not neuropathy, see OV note 08-07-2012 , used to see pain med (Dr Vira Blanco) -High Risk UDS 2016 --CTS Diastasis recti  PLAN Follow-up from previous visit Orthopnea: BNP normal, echo pending, scheduled for 07/29/2022.  Recommend to keep the appointment. BPH, chronic LUTS: Last PSA elevated, + urine culture:  no antibiotics prescribed.  Never saw urology, will try again.  See AVS. High cholesterol, coronary calcifications: LFT last months not at goal, rosuvastatin increased to 10 mg.  Patient did not review MyChart.Prescription already sent, encourage compliance.Continue Zetia OSA: Was referred to pulmonary, patient did not return calls from pulmonary. Vitamin D deficiency: Based on last labs, was rx ergocalciferol. Will deactivate MyChart Follow-up 1 month

## 2022-07-19 NOTE — Assessment & Plan Note (Signed)
Follow-up from previous visit Orthopnea: BNP normal, echo pending, scheduled for 07/29/2022.  Recommend to keep the appointment. BPH, chronic LUTS: Last PSA elevated, + urine culture:  no antibiotics prescribed.  Never saw urology, will try again.  See AVS. High cholesterol, coronary calcifications: LFT last months not at goal, rosuvastatin increased to 10 mg.  Patient did not review MyChart.Prescription already sent, encourage compliance.Continue Zetia OSA: Was referred to pulmonary, patient did not return calls from pulmonary. Vitamin D deficiency: Based on last labs, was rx ergocalciferol. Will deactivate MyChart Follow-up 1 month

## 2022-07-29 ENCOUNTER — Other Ambulatory Visit (HOSPITAL_COMMUNITY): Payer: Medicare Other

## 2022-08-04 ENCOUNTER — Ambulatory Visit (INDEPENDENT_AMBULATORY_CARE_PROVIDER_SITE_OTHER): Payer: Medicare Other | Admitting: *Deleted

## 2022-08-04 DIAGNOSIS — Z0000 Encounter for general adult medical examination without abnormal findings: Secondary | ICD-10-CM

## 2022-08-04 NOTE — Progress Notes (Signed)
Subjective:   Robert Gibbs is a 72 y.o. male who presents for Medicare Annual/Subsequent preventive examination.  I connected with  Robert Gibbs on 08/04/22 by a audio enabled telemedicine application and verified that I am speaking with the correct person using two identifiers.  Patient Location: Home  Provider Location: Office/Clinic  I discussed the limitations of evaluation and management by telemedicine. The patient expressed understanding and agreed to proceed.   Review of Systems    Defer to PCP Cardiac Risk Factors include: advanced age (>63mn, >>58women);male gender;hypertension;dyslipidemia     Objective:    There were no vitals filed for this visit. There is no height or weight on file to calculate BMI.     08/04/2022    1:41 PM 07/30/2021    3:21 PM 08/08/2020    2:44 PM 07/28/2015   12:40 PM 03/08/2014    5:02 PM 03/02/2014   10:13 AM 02/20/2014   10:22 AM  Advanced Directives  Does Patient Have a Medical Advance Directive? No No No No No No No  Would patient like information on creating a medical advance directive? No - Patient declined Yes (MAU/Ambulatory/Procedural Areas - Information given) No - Patient declined   No - patient declined information No - patient declined information    Current Medications (verified) Outpatient Encounter Medications as of 08/04/2022  Medication Sig   albuterol (VENTOLIN HFA) 108 (90 Base) MCG/ACT inhaler Inhale 2 puffs into the lungs every 6 (six) hours as needed for wheezing or shortness of breath.   Ascorbic Acid (VITAMIN C) 1000 MG tablet Take 1,000 mg by mouth daily.   aspirin 81 MG tablet Take 81 mg by mouth daily.   buPROPion (WELLBUTRIN XL) 300 MG 24 hr tablet Take 1 tablet (300 mg total) by mouth daily.   Calcium Carbonate-Vit D-Min (CALCIUM 1200) 1200-1000 MG-UNIT CHEW Chew 1 tablet by mouth daily.    Coenzyme Q10 200 MG capsule Take 200 mg by mouth daily.   escitalopram (LEXAPRO) 20 MG tablet Take 1 tablet (20 mg  total) by mouth daily.   ezetimibe (ZETIA) 10 MG tablet TAKE 1 TABLET(10 MG) BY MOUTH DAILY   gabapentin (NEURONTIN) 800 MG tablet Take 1 tablet (800 mg total) by mouth at bedtime.   lisinopril (ZESTRIL) 40 MG tablet Take 1 tablet (40 mg total) by mouth daily.   magnesium oxide (MAG-OX) 400 MG tablet Take 400 mg by mouth daily.   Melatonin 5 MG CAPS Take 10 mg by mouth at bedtime. Patient stated that he is taking 10 mg daily   metoprolol succinate (TOPROL-XL) 100 MG 24 hr tablet Take 2 tablets (200 mg total) by mouth daily. Take with or immediately following a meal.   Multiple Vitamins-Minerals (MULTIVITAMIN WITH MINERALS) tablet Take 1 tablet by mouth daily.   Omega-3 Fat Ac-Cholecalciferol (FLEX OMEGA BENEFITS/VIT D-3 PO) Take 2 capsules by mouth daily.   omeprazole (PRILOSEC) 40 MG capsule Take 1 capsule (40 mg total) by mouth daily before breakfast.   rosuvastatin (CRESTOR) 10 MG tablet Take 1 tablet (10 mg total) by mouth at bedtime.   triamcinolone (NASACORT) 55 MCG/ACT AERO nasal inhaler Place 2 sprays into the nose at bedtime.   Vitamin D, Ergocalciferol, (DRISDOL) 1.25 MG (50000 UNIT) CAPS capsule Take 1 capsule (50,000 Units total) by mouth every 7 (seven) days.   zolpidem (AMBIEN) 10 MG tablet TAKE 1/2 TO 1 TABLET(5 TO 10 MG) BY MOUTH AT BEDTIME AS NEEDED FOR SLEEP   No facility-administered encounter  medications on file as of 08/04/2022.    Allergies (verified) Codeine, Nsaids, Simvastatin, and Tolmetin   History: Past Medical History:  Diagnosis Date   Anxiety and depression    Arthritis    Barrett esophagus    BPH (benign prostatic hyperplasia)    CTS (carpal tunnel syndrome)    h/o   Edema of both legs    legs , ankles and feet   Erectile dysfunction    GERD (gastroesophageal reflux disease) 03/18/2012   H/O hiatal hernia    HTN (hypertension)    Hyperlipidemia    Microscopic hematuria    Neuropathy    OSA (obstructive sleep apnea) 03/18/2012   on BiPap    Prostatitis    PVD (peripheral vascular disease) ([place])    Spinal stenosis 06-2012   saw neuro w/ "neuropathy", dx w/ spinal stenosis   Varicose veins    Vitamin D deficiency    Wears glasses    Past Surgical History:  Procedure Laterality Date   ARCH BAR REMOVAL     BACK SURGERY  2002   L4-L5   ELBOW SURGERY  1992   for tennis elbow-rt   ESOPHAGOGASTRODUODENOSCOPY  04/2015   w/ biopsy, short tongue of Barrett's without obvious nodularity, mass or esophagitis seen   EYE SURGERY Right    cataract with lens implant   FOOT SURGERY     R, for plantar fasciitis   HERNIA REPAIR     x 2 as a child-ing    KNEE ARTHROSCOPY WITH MENISCAL REPAIR Bilateral 05/04/2013   Procedure: BILATERAL KNEE ARTHROSCOPY WITH POSSIBLE CHONDROPLASTY AND MENISCECTOMY ;  Surgeon: Kerin Salen, MD;  Location: [facility];  Service: Orthopedics;  Laterality: Bilateral;  Right chrondroplasty, debridement, , partial medial menisectomy, removal of loose bodies.    LIGATION / DIVISION SAPHENOUS VEIN     both legs   PROSTATE BIOPSY     2010, (-)   TONSILLECTOMY     TOTAL KNEE ARTHROPLASTY Right 11/07/2013   Procedure: TOTAL KNEE ARTHROPLASTY;  Surgeon: Kerin Salen, MD;  Location: [facility];  Service: Orthopedics;  Laterality: Right;   TOTAL KNEE ARTHROPLASTY Left 03/01/2014   Procedure: TOTAL KNEE ARTHROPLASTY;  Surgeon: Kerin Salen, MD;  Location: [facility];  Service: Orthopedics;  Laterality: Left;   UPPER GASTROINTESTINAL ENDOSCOPY     UVULOPALATOPHARYNGOPLASTY (UPPP)/TONSILLECTOMY/SEPTOPLASTY  1990   Family History  Problem Relation Age of Onset   CAD Father        ?   Alzheimer's disease Mother    Stroke Brother    Colon cancer Neg Hx    Prostate cancer Neg Hx    Diabetes Neg Hx    Cancer Neg Hx    Social History   Socioeconomic History   Marital status: Divorced    Spouse name: Not on file   Number of children: 1   Years of education: Not on file   Highest education level: Bachelor's  degree (e.g., BA, AB, BS)  Occupational History   Occupation: retired   Tobacco Use   Smoking status: Former    Packs/day: 1.00    Years: 40.00    Total pack years: 40.00    Types: Cigarettes    Start date: 66    Quit date: 06/29/2008    Years since quitting: 14.1   Smokeless tobacco: Never   Tobacco comments:    quit 2010  Substance and Sexual Activity   Alcohol use: Yes    Alcohol/week: 3.0 standard  drinks of alcohol    Types: 3 Standard drinks or equivalent per week    Comment: 2-3 large drinks, Vodka, 1-2 times a week   Drug use: No   Sexual activity: Yes  Other Topics Concern   Not on file  Social History Narrative   Lives w/  fiance of > 20 years    caffeine- 2-3  c daily       Social Determinants of Health   Financial Resource Strain: Low Risk  (07/30/2021)   Overall Financial Resource Strain (CARDIA)    Difficulty of Paying Living Expenses: Not hard at all  Food Insecurity: No Food Insecurity (08/04/2022)   Hunger Vital Sign    Worried About Running Out of Food in the Last Year: Never true    Ran Out of Food in the Last Year: Never true  Transportation Needs: No Transportation Needs (08/04/2022)   PRAPARE - Hydrologist (Medical): No    Lack of Transportation (Non-Medical): No  Physical Activity: Inactive (07/30/2021)   Exercise Vital Sign    Days of Exercise per Week: 0 days    Minutes of Exercise per Session: 0 min  Stress: Stress Concern Present (07/30/2021)   [place]    Feeling of Stress : To some extent  Social Connections: Socially Isolated (07/30/2021)   Social Connection and Isolation Panel [NHANES]    Frequency of Communication with Friends and Family: Once a week    Frequency of Social Gatherings with Friends and Family: Once a week    Attends Religious Services: Never    Marine scientist or Organizations: No    Attends Music therapist:  Never    Marital Status: Divorced    Tobacco Counseling Counseling given: Not Answered Tobacco comments: quit 2010   Clinical Intake:  Pre-visit preparation completed: Yes  Pain : No/denies pain  Diabetes: No  How often do you need to have someone help you when you read instructions, pamphlets, or other written materials from your doctor or pharmacy?: 1 - Never  Activities of Daily Living    08/04/2022    1:47 PM  In your present state of health, do you have any difficulty performing the following activities:  Hearing? 1  Vision? 1  Difficulty concentrating or making decisions? 1  Walking or climbing stairs? 1  Dressing or bathing? 0  Doing errands, shopping? 0  Preparing Food and eating ? N  Using the Toilet? N  In the past six months, have you accidently leaked urine? Y  Do you have problems with loss of bowel control? N  Managing your Medications? N  Managing your Finances? N  Housekeeping or managing your Housekeeping? N    Patient Care Team: Colon Branch, MD as PCP - General (Internal Medicine) Lake Bells., MD as Referring Physician (Gastroenterology) Felipa Eth, Reece Leader., MD as Referring Physician (Urology)  Indicate any recent Medical Services you may have received from other than [facility] providers in the past year (date may be approximate).     Assessment:   This is a routine wellness examination for [place].  Hearing/Vision screen No results found.  Dietary issues and exercise activities discussed: Current Exercise Habits: The patient does not participate in regular exercise at present, Exercise limited by: orthopedic condition(s)   Goals Addressed   None    Depression Screen    08/04/2022    1:44 PM 07/18/2022   12:54 PM  06/17/2022    2:35 PM 11/27/2021    1:25 PM 10/14/2021    1:45 PM 07/30/2021    3:19 PM 05/29/2021    1:21 PM  PHQ 2/9 Scores  PHQ - 2 Score 5 0 0 2 2 2 2  $ PHQ- 9 Score 22 4  7 4 4 8    $ Fall Risk    08/04/2022    1:41 PM  07/18/2022    1:00 PM 06/17/2022    2:35 PM 11/27/2021    1:03 PM 10/14/2021    1:45 PM  Fall Risk   Falls in the past year? 0 0 0 0 0  Number falls in past yr: 0 0 0 0 0  Injury with Fall? 0 0 0 0 0  Risk for fall due to : No Fall Risks  No Fall Risks    Follow up Falls evaluation completed Falls evaluation completed Falls evaluation completed Falls evaluation completed Falls evaluation completed    [place]:  Any stairs in or around the home? Yes  If so, are there any without handrails? No  Home free of loose throw rugs in walkways, pet beds, electrical cords, etc? Yes  Adequate lighting in your home to reduce risk of falls? Yes   ASSISTIVE DEVICES UTILIZED TO PREVENT FALLS:  Life alert? No  Use of a cane, walker or w/c? No  Grab bars in the bathroom? No  Shower chair or bench in shower? No  Elevated toilet seat or a handicapped toilet? Yes   TIMED UP AND GO:  Was the test performed?  No, audio visit .    Cognitive Function:        08/04/2022    1:50 PM 07/30/2021    3:24 PM  6CIT Screen  What Year? 0 points 0 points  What month? 0 points 0 points  What time? 0 points 0 points  Count back from 20 0 points 0 points  Months in reverse 0 points 2 points  Repeat phrase 6 points 4 points  Total Score 6 points 6 points    Immunizations Immunization History  Administered Date(s) Administered   Fluad Quad(high Dose 65+) 04/22/2019, 05/02/2020   Influenza Split 04/27/2012   Influenza, High Dose Seasonal PF 03/21/2016, 05/29/2017, 06/02/2018   Influenza,inj,Quad PF,6+ Mos 03/24/2013, 04/18/2014, 04/24/2015   Influenza-Unspecified 03/23/2021, 04/28/2022   PFIZER(Purple Top)SARS-COV-2 Vaccination 08/15/2019, 09/05/2019, 08/01/2020   PPD Test 11/09/2013   Pfizer Covid-19 Vaccine Bivalent Booster 25yr & up 04/18/2021, 04/28/2022   Pneumococcal Conjugate-13 04/18/2014   Pneumococcal Polysaccharide-23 03/24/2013, 05/29/2021   Tdap  11/16/2012   Zoster Recombinat (Shingrix) 05/26/2019   Zoster, Live 11/17/2012    TDAP status: Up to date  Flu Vaccine status: Up to date  Pneumococcal vaccine status: Up to date  Covid-19 vaccine status: Declined, Education has been provided regarding the importance of this vaccine but patient still declined. Advised may receive this vaccine at local pharmacy or Health Dept.or vaccine clinic. Aware to provide a copy of the vaccination record if obtained from local pharmacy or Health Dept. Verbalized acceptance and understanding.  Qualifies for Shingles Vaccine? Yes   Zostavax completed Yes   Shingrix Completed?: No.    Education has been provided regarding the importance of this vaccine. Patient has been advised to call insurance company to determine out of pocket expense if they have not yet received this vaccine. Advised may also receive vaccine at local pharmacy or Health Dept. Verbalized acceptance and understanding.  Screening Tests Health Maintenance  Topic Date Due   Zoster Vaccines- Shingrix (2 of 2) 07/21/2019   Medicare Annual Wellness (AWV)  07/30/2022   COVID-19 Vaccine (6 - 2023-24 season) 01/15/2023 (Originally 06/23/2022)   Lung Cancer Screening  09/13/2022   DTaP/Tdap/Td (2 - Td or Tdap) 11/17/2022   COLONOSCOPY (Pts 45-10yr Insurance coverage will need to be confirmed)  12/01/2025   Pneumonia Vaccine 72 Years old  Completed   INFLUENZA VACCINE  Completed   Hepatitis C Screening  Completed   HPV VACCINES  Aged Out    Health Maintenance  Health Maintenance Due  Topic Date Due   Zoster Vaccines- Shingrix (2 of 2) 07/21/2019   Medicare Annual Wellness (AWV)  07/30/2022    Colorectal cancer screening: Type of screening: Colonoscopy. Completed 12/02/18. Repeat every 7 years  Lung Cancer Screening: (Low Dose CT Chest recommended if Age 72-80years, 30 pack-year currently smoking OR have quit w/in 15years.) does qualify.   Lung Cancer Screening Referral: already  in system.  Next scan is scheduled for 09/15/22.  Additional Screening:  Hepatitis C Screening: does qualify; Completed 03/21/16  Vision Screening: Recommended annual ophthalmology exams for early detection of glaucoma and other disorders of the eye. Is the patient up to date with their annual eye exam?  No  Who is the provider or what is the name of the office in which the patient attends annual eye exams? [facility] If pt is not established with a provider, would they like to be referred to a provider to establish care? No .   Dental Screening: Recommended annual dental exams for proper oral hygiene  Community Resource Referral / Chronic Care Management: CRR required this visit?  No   CCM required this visit?  No      Plan:     I have personally reviewed and noted the following in the patient's chart:   Medical and social history Use of alcohol, tobacco or illicit drugs  Current medications and supplements including opioid prescriptions. Patient is not currently taking opioid prescriptions. Functional ability and status Nutritional status Physical activity Advanced directives List of other physicians Hospitalizations, surgeries, and ER visits in previous 12 months Vitals Screenings to include cognitive, depression, and falls Referrals and appointments  In addition, I have reviewed and discussed with patient certain preventive protocols, quality metrics, and best practice recommendations. A written personalized care plan for preventive services as well as general preventive health recommendations were provided to patient.   Due to this being a telephonic visit, the after visit summary with patients personalized plan was offered to patient via mail or my-chart. Per request, patient was mailed a copy of AVS.   [place]  08/04/2022   Nurse Notes: None

## 2022-08-04 NOTE — Patient Instructions (Signed)
Robert Gibbs , Thank you for taking time to come for your Medicare Wellness Visit. I appreciate your ongoing commitment to your health goals. Please review the following plan we discussed and let me know if I can assist you in the future.   These are the goals we discussed:  Goals      Patient Stated     Lose weight         This is a list of the screening recommended for you and due dates:  Health Maintenance  Topic Date Due   Zoster (Shingles) Vaccine (2 of 2) 07/21/2019   COVID-19 Vaccine (6 - 2023-24 season) 01/15/2023*   Screening for Lung Cancer  09/13/2022   DTaP/Tdap/Td vaccine (2 - Td or Tdap) 11/17/2022   Medicare Annual Wellness Visit  08/05/2023   Colon Cancer Screening  12/01/2025   Pneumonia Vaccine  Completed   Flu Shot  Completed   Hepatitis C Screening: USPSTF Recommendation to screen - Ages 18-79 yo.  Completed   HPV Vaccine  Aged Out  *Topic was postponed. The date shown is not the original due date.     Next appointment: Follow up in one year for your annual wellness visit.   Preventive Care 38 Years and Older, Male Preventive care refers to lifestyle choices and visits with your health care provider that can promote health and wellness. What does preventive care include? A yearly physical exam. This is also called an annual well check. Dental exams once or twice a year. Routine eye exams. Ask your health care provider how often you should have your eyes checked. Personal lifestyle choices, including: Daily care of your teeth and gums. Regular physical activity. Eating a healthy diet. Avoiding tobacco and drug use. Limiting alcohol use. Practicing safe sex. Taking low doses of aspirin every day. Taking vitamin and mineral supplements as recommended by your health care provider. What happens during an annual well check? The services and screenings done by your health care provider during your annual well check will depend on your age, overall health,  lifestyle risk factors, and family history of disease. Counseling  Your health care provider may ask you questions about your: Alcohol use. Tobacco use. Drug use. Emotional well-being. Home and relationship well-being. Sexual activity. Eating habits. History of falls. Memory and ability to understand (cognition). Work and work Statistician. Screening  You may have the following tests or measurements: Height, weight, and BMI. Blood pressure. Lipid and cholesterol levels. These may be checked every 5 years, or more frequently if you are over 54 years old. Skin check. Lung cancer screening. You may have this screening every year starting at age 49 if you have a 30-pack-year history of smoking and currently smoke or have quit within the past 15 years. Fecal occult blood test (FOBT) of the stool. You may have this test every year starting at age 34. Flexible sigmoidoscopy or colonoscopy. You may have a sigmoidoscopy every 5 years or a colonoscopy every 10 years starting at age 35. Prostate cancer screening. Recommendations will vary depending on your family history and other risks. Hepatitis C blood test. Hepatitis B blood test. Sexually transmitted disease (STD) testing. Diabetes screening. This is done by checking your blood sugar (glucose) after you have not eaten for a while (fasting). You may have this done every 1-3 years. Abdominal aortic aneurysm (AAA) screening. You may need this if you are a current or former smoker. Osteoporosis. You may be screened starting at age 46 if you are  at high risk. Talk with your health care provider about your test results, treatment options, and if necessary, the need for more tests. Vaccines  Your health care provider may recommend certain vaccines, such as: Influenza vaccine. This is recommended every year. Tetanus, diphtheria, and acellular pertussis (Tdap, Td) vaccine. You may need a Td booster every 10 years. Zoster vaccine. You may need this  after age 87. Pneumococcal 13-valent conjugate (PCV13) vaccine. One dose is recommended after age 61. Pneumococcal polysaccharide (PPSV23) vaccine. One dose is recommended after age 84. Talk to your health care provider about which screenings and vaccines you need and how often you need them. This information is not intended to replace advice given to you by your health care provider. Make sure you discuss any questions you have with your health care provider. Document Released: 07/06/2015 Document Revised: 02/27/2016 Document Reviewed: 04/10/2015 Elsevier Interactive Patient Education  2017 [place] Prevention in the Home Falls can cause injuries. They can happen to people of all ages. There are many things you can do to make your home safe and to help prevent falls. What can I do on the outside of my home? Regularly fix the edges of walkways and driveways and fix any cracks. Remove anything that might make you trip as you walk through a door, such as a raised step or threshold. Trim any bushes or trees on the path to your home. Use bright outdoor lighting. Clear any walking paths of anything that might make someone trip, such as rocks or tools. Regularly check to see if handrails are loose or broken. Make sure that both sides of any steps have handrails. Any raised decks and porches should have guardrails on the edges. Have any leaves, snow, or ice cleared regularly. Use sand or salt on walking paths during winter. Clean up any spills in your garage right away. This includes oil or grease spills. What can I do in the bathroom? Use night lights. Install grab bars by the toilet and in the tub and shower. Do not use towel bars as grab bars. Use non-skid mats or decals in the tub or shower. If you need to sit down in the shower, use a plastic, non-slip stool. Keep the floor dry. Clean up any water that spills on the floor as soon as it happens. Remove soap buildup in the tub or  shower regularly. Attach bath mats securely with double-sided non-slip rug tape. Do not have throw rugs and other things on the floor that can make you trip. What can I do in the bedroom? Use night lights. Make sure that you have a light by your bed that is easy to reach. Do not use any sheets or blankets that are too big for your bed. They should not hang down onto the floor. Have a firm chair that has side arms. You can use this for support while you get dressed. Do not have throw rugs and other things on the floor that can make you trip. What can I do in the kitchen? Clean up any spills right away. Avoid walking on wet floors. Keep items that you use a lot in easy-to-reach places. If you need to reach something above you, use a strong step stool that has a grab bar. Keep electrical cords out of the way. Do not use floor polish or wax that makes floors slippery. If you must use wax, use non-skid floor wax. Do not have throw rugs and other things on the floor  that can make you trip. What can I do with my stairs? Do not leave any items on the stairs. Make sure that there are handrails on both sides of the stairs and use them. Fix handrails that are broken or loose. Make sure that handrails are as long as the stairways. Check any carpeting to make sure that it is firmly attached to the stairs. Fix any carpet that is loose or worn. Avoid having throw rugs at the top or bottom of the stairs. If you do have throw rugs, attach them to the floor with carpet tape. Make sure that you have a light switch at the top of the stairs and the bottom of the stairs. If you do not have them, ask someone to add them for you. What else can I do to help prevent falls? Wear shoes that: Do not have high heels. Have rubber bottoms. Are comfortable and fit you well. Are closed at the toe. Do not wear sandals. If you use a stepladder: Make sure that it is fully opened. Do not climb a closed stepladder. Make  sure that both sides of the stepladder are locked into place. Ask someone to hold it for you, if possible. Clearly mark and make sure that you can see: Any grab bars or handrails. First and last steps. Where the edge of each step is. Use tools that help you move around (mobility aids) if they are needed. These include: Canes. Walkers. Scooters. Crutches. Turn on the lights when you go into a dark area. Replace any light bulbs as soon as they burn out. Set up your furniture so you have a clear path. Avoid moving your furniture around. If any of your floors are uneven, fix them. If there are any pets around you, be aware of where they are. Review your medicines with your doctor. Some medicines can make you feel dizzy. This can increase your chance of falling. Ask your doctor what other things that you can do to help prevent falls. This information is not intended to replace advice given to you by your health care provider. Make sure you discuss any questions you have with your health care provider. Document Released: 04/05/2009 Document Revised: 11/15/2015 Document Reviewed: 07/14/2014 Elsevier Interactive Patient Education  2017 [employer].

## 2022-08-04 NOTE — Progress Notes (Signed)
I have reviewed and agree with [facility] documentation.  Kathlene November, MD

## 2022-08-06 ENCOUNTER — Encounter (HOSPITAL_COMMUNITY): Payer: Self-pay | Admitting: Internal Medicine

## 2022-08-11 ENCOUNTER — Other Ambulatory Visit: Payer: Self-pay | Admitting: Internal Medicine

## 2022-08-18 ENCOUNTER — Encounter: Payer: Self-pay | Admitting: Internal Medicine

## 2022-08-18 ENCOUNTER — Ambulatory Visit (INDEPENDENT_AMBULATORY_CARE_PROVIDER_SITE_OTHER): Payer: Medicare Other | Admitting: Internal Medicine

## 2022-08-18 VITALS — BP 126/74 | HR 47 | Temp 97.7°F | Resp 20 | Ht 70.0 in | Wt 299.4 lb

## 2022-08-18 DIAGNOSIS — E785 Hyperlipidemia, unspecified: Secondary | ICD-10-CM

## 2022-08-18 DIAGNOSIS — N390 Urinary tract infection, site not specified: Secondary | ICD-10-CM | POA: Diagnosis not present

## 2022-08-18 DIAGNOSIS — R972 Elevated prostate specific antigen [PSA]: Secondary | ICD-10-CM

## 2022-08-18 MED ORDER — ALBUTEROL SULFATE HFA 108 (90 BASE) MCG/ACT IN AERS: 2.0000 | INHALATION_SPRAY | Freq: Four times a day (QID) | RESPIRATORY_TRACT | 5 refills | Status: DC | PRN

## 2022-08-18 NOTE — Assessment & Plan Note (Signed)
Hyperglycemia: Last A1c 5.6. High cholesterol: LDL was 79 back in December, patient on Zetia, rosuvastatin was added, + Cornary calcifications, goal LDL= 70.  Checking labs. Orthopnea: Symptoms are at baseline, patient thinks his anxiety, I recommended a ECHO but he  [facility] BPH, chronic LUTS: Has not seen urology.  Explained patient the need to see his urologist due to increased PSA and a + urine culture few weeks ago (not treated w/  abx)  Vitamin D insufficiency: Levels remain low in December, was recommended ergocalciferol OSA: Not using CPAP frequently RTC 4 months

## 2022-08-18 NOTE — Progress Notes (Addendum)
Subjective:    Patient ID: Robert Gibbs, male    DOB: 1950-12-21, 72 y.o.   MRN: [id number]  DOS:  08/18/2022 Type of visit - description: f/u  Here for follow-up on previous 2 visits. He complained of orthopnea a few weeks ago, symptoms are about the same. He has increased PSA and + urine culture, he has some urinary frequency which is at baseline. Sometimes we need to "clamp" his penis to prevent urinating before getting to the bathroom, he said that after doing that sometimes he sees drops of blood in the urine. No fever or chills  Review of Systems See above   Past Medical History:  Diagnosis Date   Anxiety and depression    Arthritis    Barrett esophagus    BPH (benign prostatic hyperplasia)    CTS (carpal tunnel syndrome)    h/o   Edema of both legs    legs , ankles and feet   Erectile dysfunction    GERD (gastroesophageal reflux disease) 03/18/2012   H/O hiatal hernia    HTN (hypertension)    Hyperlipidemia    Microscopic hematuria    Neuropathy    OSA (obstructive sleep apnea) 03/18/2012   on BiPap   Prostatitis    PVD (peripheral vascular disease) ([place])    Spinal stenosis 06-2012   saw neuro w/ "neuropathy", dx w/ spinal stenosis   Varicose veins    Vitamin D deficiency    Wears glasses     Past Surgical History:  Procedure Laterality Date   ARCH BAR REMOVAL     BACK SURGERY  2002   L4-L5   ELBOW SURGERY  1992   for tennis elbow-rt   ESOPHAGOGASTRODUODENOSCOPY  04/2015   w/ biopsy, short tongue of Barrett's without obvious nodularity, mass or esophagitis seen   EYE SURGERY Right    cataract with lens implant   FOOT SURGERY     R, for plantar fasciitis   HERNIA REPAIR     x 2 as a child-ing    KNEE ARTHROSCOPY WITH MENISCAL REPAIR Bilateral 05/04/2013   Procedure: BILATERAL KNEE ARTHROSCOPY WITH POSSIBLE CHONDROPLASTY AND MENISCECTOMY ;  Surgeon: Kerin Salen, MD;  Location: [place];  Service: Orthopedics;  Laterality:  Bilateral;  Right chrondroplasty, debridement, , partial medial menisectomy, removal of loose bodies.    LIGATION / DIVISION SAPHENOUS VEIN     both legs   PROSTATE BIOPSY     2010, (-)   TONSILLECTOMY     TOTAL KNEE ARTHROPLASTY Right 11/07/2013   Procedure: TOTAL KNEE ARTHROPLASTY;  Surgeon: Kerin Salen, MD;  Location: [place];  Service: Orthopedics;  Laterality: Right;   TOTAL KNEE ARTHROPLASTY Left 03/01/2014   Procedure: TOTAL KNEE ARTHROPLASTY;  Surgeon: Kerin Salen, MD;  Location: [place];  Service: Orthopedics;  Laterality: Left;   UPPER GASTROINTESTINAL ENDOSCOPY     UVULOPALATOPHARYNGOPLASTY (UPPP)/TONSILLECTOMY/SEPTOPLASTY  1990    Current Outpatient Medications  Medication Instructions   albuterol (VENTOLIN HFA) 108 (90 Base) MCG/ACT inhaler 2 puffs, Inhalation, Every 6 hours PRN   aspirin 81 mg, Oral, Daily   buPROPion (WELLBUTRIN XL) 300 mg, Oral, Daily   Calcium Carbonate-Vit D-Min (CALCIUM 1200) 1200-1000 MG-UNIT CHEW 1 tablet, Daily   Coenzyme Q10 200 mg, Daily   escitalopram (LEXAPRO) 20 mg, Oral, Daily   ezetimibe (ZETIA) 10 MG tablet TAKE 1 TABLET(10 MG) BY MOUTH DAILY   gabapentin (NEURONTIN) 800 mg, Oral, Daily at bedtime   lisinopril (ZESTRIL) 40  mg, Oral, Daily   magnesium oxide (MAG-OX) 400 mg, Daily   Melatonin 10 mg, Daily at bedtime   metoprolol succinate (TOPROL-XL) 200 mg, Oral, Daily, Take with or immediately following a meal.   Multiple Vitamins-Minerals (MULTIVITAMIN WITH MINERALS) tablet 1 tablet, Daily   Omega-3 Fat Ac-Cholecalciferol (FLEX OMEGA BENEFITS/VIT D-3 PO) 2 capsules, Daily   omeprazole (PRILOSEC) 40 MG capsule TAKE 1 CAPSULE(40 MG) BY MOUTH DAILY BEFORE BREAKFAST   rosuvastatin (CRESTOR) 10 mg, Oral, Daily at bedtime   triamcinolone (NASACORT) 55 MCG/ACT AERO nasal inhaler 2 sprays, Daily at bedtime   vitamin C 1,000 mg, Daily   Vitamin D (Ergocalciferol) (DRISDOL) 50,000 Units, Oral, Every 7 days   zolpidem (AMBIEN) 10 MG tablet TAKE  1/2 TO 1 TABLET(5 TO 10 MG) BY MOUTH AT BEDTIME AS NEEDED FOR SLEEP       Objective:   Physical Exam BP 126/74   Pulse (!) 47   Temp 97.7 F (36.5 C) (Oral)   Resp 20   Ht '5\' 10"'$  (1.778 m)   Wt 299 lb 6 oz (135.8 kg)   SpO2 97%   BMI 42.96 kg/m  General:   Well developed, NAD, BMI noted. HEENT:  Normocephalic . Face symmetric, atraumatic   Lower extremities: no pretibial edema bilaterally  Skin: Not pale. Not jaundice Neurologic:  alert & oriented X3.  Speech normal, gait appropriate for age and unassisted Psych--  Cognition and judgment appear intact.  Cooperative with normal attention span and concentration.  Behavior appropriate. No anxious or depressed appearing.      Assessment     Assessment Hyperglycemia HTN Hyperlipidemia: s/e  with simvastatin, declined other statins 08-2019 Chronic lower extremity edema w/ some pretibial redness  (ER 2-17, [place] neg DVT, BNP wnl) anxiety depression, insomnia COPD: Per CT 03-2016 Coronary calcifications per CTA. Morbid obesity GI: GERD, Barrett's  esophagus OSA, BiPAP, per pulmonary, not using frequently as of 07-2021  GU: Dr Felipa Eth  --LUTS , failed a # of medications --BPH, elevated PSA:  (-)  biopsy 2009, PSA 05/2014 2.92 --ED --Hematuria, CT, cystoscopy 2009 negative --Prostatitis, admitted 03-2020 Vitamin D deficiency  MSK: -- Spinal stenosis : saw neurology w/ sx of paresthesias feet-ankle, sx felt to be d/t spinal stenosis not neuropathy, see OV note 08-07-2012 , used to see pain med (Dr Vira Blanco) -High Risk UDS 2016 --CTS Diastasis recti  PLAN Hyperglycemia: Last A1c 5.6. High cholesterol: LDL was 79 back in December, patient on Zetia, rosuvastatin was added, + Cornary calcifications, goal LDL= 70.  Checking labs. Orthopnea: Symptoms are at baseline, patient thinks his anxiety, I recommended a ECHO but he  [facility] BPH, chronic LUTS: Has not seen urology.  Explained patient the need to see his urologist due to  increased PSA and a + urine culture few weeks ago (not treated w/  abx)  Vitamin D insufficiency: Levels remain low in December, was recommended ergocalciferol OSA: Not using CPAP frequently RTC 4 months

## 2022-08-18 NOTE — Patient Instructions (Addendum)
Is important you see your urologist.  They need to decide if you need antibiotics for the urinary bacteria  If you develop fever, chills or your difficulty with your urine or worsen: Seek medical attention.  Check the  blood pressure regularly BP GOAL is between 110/65 and  135/85. If it is consistently higher or lower, let me know       GO TO THE LAB : Get the blood work     [place], [place] back for checkup in 4 months

## 2022-08-19 ENCOUNTER — Other Ambulatory Visit: Payer: Self-pay | Admitting: Internal Medicine

## 2022-08-19 LAB — ALT: ALT: 14 U/L (ref 0–53)

## 2022-08-19 LAB — LIPID PANEL
Cholesterol: 104 mg/dL (ref 0–200)
HDL: 39.1 mg/dL (ref >39.00)
LDL Cholesterol: 48 mg/dL (ref 0–99)
NonHDL: 65.37
Total CHOL/HDL Ratio: 3
Triglycerides: 87 mg/dL (ref 0.0–149.0)
VLDL: 17.4 mg/dL (ref 0.0–40.0)

## 2022-08-19 LAB — AST: AST: 12 U/L (ref 0–37)

## 2022-09-02 ENCOUNTER — Ambulatory Visit (INDEPENDENT_AMBULATORY_CARE_PROVIDER_SITE_OTHER): Payer: Medicare Other | Admitting: Urology

## 2022-09-02 ENCOUNTER — Encounter: Payer: Self-pay | Admitting: Urology

## 2022-09-02 VITALS — BP 172/82 | HR 87 | Ht 70.0 in | Wt 299.0 lb

## 2022-09-02 DIAGNOSIS — N401 Enlarged prostate with lower urinary tract symptoms: Secondary | ICD-10-CM

## 2022-09-02 DIAGNOSIS — R8290 Unspecified abnormal findings in urine: Secondary | ICD-10-CM

## 2022-09-02 DIAGNOSIS — Z87440 Personal history of urinary (tract) infections: Secondary | ICD-10-CM

## 2022-09-02 DIAGNOSIS — R972 Elevated prostate specific antigen [PSA]: Secondary | ICD-10-CM

## 2022-09-02 DIAGNOSIS — N390 Urinary tract infection, site not specified: Secondary | ICD-10-CM | POA: Insufficient documentation

## 2022-09-02 DIAGNOSIS — N138 Other obstructive and reflux uropathy: Secondary | ICD-10-CM

## 2022-09-02 DIAGNOSIS — R3129 Other microscopic hematuria: Secondary | ICD-10-CM | POA: Diagnosis not present

## 2022-09-02 LAB — URINALYSIS, MICROSCOPIC ONLY
Cast Type: 0
Casts, Ur, LPF, POC: 0
Crystal Type: 0
Crystals, Ur, HPF, POC: 0
Epithelial cells, urine: >10
Trichomonas: 0
Yeast, UA: 0

## 2022-09-02 LAB — URINALYSIS
Bilirubin, UA: NEGATIVE
Glucose, UA: NEGATIVE mg/dL
Leukocytes, UA: NEGATIVE
Nitrite, UA: NEGATIVE
Protein, UA: POSITIVE — AB
Spec Grav, UA: >=1.03 — AB (ref 1.010–1.025)
Urobilinogen, UA: 0.2 E.U./dL
pH, UA: 5.5 (ref 5.0–8.0)

## 2022-09-02 LAB — BLADDER SCAN AMB NON-IMAGING

## 2022-09-02 MED ORDER — GEMTESA 75 MG PO TABS: 75.0000 mg | ORAL_TABLET | Freq: Every day | ORAL | 0 refills | Status: DC

## 2022-09-02 NOTE — Progress Notes (Signed)
Assessment: 1. BPH with obstruction/lower urinary tract symptoms   2. Microscopic hematuria; prior evaluation 2009   3. Elevated PSA; negatvie biopsy 2009   4. Frequent UTI   5. Abnormal urine findings     Plan: I personally reviewed the patient's chart including provider notes, and lab results. I reviewed the patient records from [facility] in detail. Urine culture sent today given history of frequent UTI's.  Will contact him with results. Trial of Gemtesa 75 mg daily.  Samples provided. Will need repeat PSA after confirmation of a negative urine culture. Consider further evaluation with cystoscopy pending result of medication trial. Return to office in 4 weeks.  Chief Complaint:  Chief Complaint  Patient presents with   Benign Prostatic Hypertrophy    History of Present Illness:  Robert Gibbs is a 72 y.o. male who is seen for continued evaluation of BPH with LUTS, microscopic hematuria, and elevated PSA. He was previously followed at [facility] in [facility] and was last seen there by me in 12/21. He has had UTI's. Urine culture results: 2/22 50-100K Enterococcus 7/23 10K Enterococcus 12/23 >100K Enterococcus  He presents today for continued management of his lower urinary tract symptoms.  He continues to have urinary frequency, urgency, and nocturia 3-4 times.  He does have associated urge incontinence.  No dysuria or gross hematuria.  He reports voiding with a good stream.  He is not currently taking any medications for his urinary symptoms.  He does not remember if he took the Myrbetriq and if it improved his symptoms. IPSS = 26 today.  Urologic History:  He has a history of elevated PSA. He had a previous negative prostate biopsy in June 2009.  PSA results: 6/09 4.7 4/13 2.36 10/15 2.92 12/16 3.63 12/16 40.33 5/19 25.47 2/22 7.54 12/23 9.95   He has a history of LUTS. He has tried various alpha blockers which did not help and were  too expensive including Flomax, Rapaflo, and Uroxatral. Daily Cialis 5 mg worked the best for his LUTS but insurance would not approve this. He tried alfuzosin which did not improve his symptoms. He reported weak FOS, frequency, urgency, incomplete emptying, and an increase in his frequency and urgency. He had intermittent episodes of urge incontinence.  He resumed Cialis 5 mg daily after his visit in 12/16 and his LUTS improved. He was seen by Dr. Larose Kells for dysuria, urgency, and strong urine odor. He was diagnosed with prostatitis and UTI. Urine culture grew E. Coli.  PSA was 40. He was initially treated with Bactrim x 4 weeks, then changed to Cipro x 2 weeks.  He was diagnosed with prostatitis and UTI in 5/19. Urine culture grew E. Coli. His symptoms resolved with antibiotics.  He was not taking daily tadalafil. AUA score = 31. He was diagnosed with prostatitis and treated with Cipro for 3 weeks. He noted some slight improvement in his symptoms. He continued to have urinary frequency, urgency, nocturia x 5, sensation of incomplete emptying and post void dribbling. No dysuria or gross hematuria. AUA score = 30.  He underwent microscopic hematuria evaluation with CT and cysto in May 2009. He was found to have a right renal cyst. Hematuria profile from 12/16 was negative for malignancy.   At his last visit in 12/21, he noted worsening of his urinary symptoms within the prior 1-2 months. He was admitted to the hospital in October 2021 with sepsis. He was also diagnosed with prostatitis. Urinalysis showed 57  RBCs, 10 WBCs. Urine culture grew <10K colonies. He reported increased frequency, urgency, nocturia x4, and urge incontinence. His symptoms persisted following treatment. No dysuria or gross hematuria. He continued to have frequency, nocturia x4, urgency, and urge incontinence. AUA score = 31. He was not on any medical therapy for his urinary symptoms at that time. He was given a trial of Myrbetriq 50 mg  daily.  Portions of the above documentation were copied from a prior visit for review purposes only.    Past Medical History:  Past Medical History:  Diagnosis Date   Anxiety and depression    Arthritis    Barrett esophagus    BPH (benign prostatic hyperplasia)    CTS (carpal tunnel syndrome)    h/o   Edema of both legs    legs , ankles and feet   Erectile dysfunction    GERD (gastroesophageal reflux disease) 03/18/2012   H/O hiatal hernia    HTN (hypertension)    Hyperlipidemia    Microscopic hematuria    Neuropathy    OSA (obstructive sleep apnea) 03/18/2012   on BiPap   Prostatitis    PVD (peripheral vascular disease) ([place])    Spinal stenosis 06-2012   saw neuro w/ "neuropathy", dx w/ spinal stenosis   Varicose veins    Vitamin D deficiency    Wears glasses     Past Surgical History:  Past Surgical History:  Procedure Laterality Date   ARCH BAR REMOVAL     BACK SURGERY  2002   L4-L5   ELBOW SURGERY  1992   for tennis elbow-rt   ESOPHAGOGASTRODUODENOSCOPY  04/2015   w/ biopsy, short tongue of Barrett's without obvious nodularity, mass or esophagitis seen   EYE SURGERY Right    cataract with lens implant   FOOT SURGERY     R, for plantar fasciitis   HERNIA REPAIR     x 2 as a child-ing    KNEE ARTHROSCOPY WITH MENISCAL REPAIR Bilateral 05/04/2013   Procedure: BILATERAL KNEE ARTHROSCOPY WITH POSSIBLE CHONDROPLASTY AND MENISCECTOMY ;  Surgeon: Kerin Salen, MD;  Location: [place];  Service: Orthopedics;  Laterality: Bilateral;  Right chrondroplasty, debridement, , partial medial menisectomy, removal of loose bodies.    LIGATION / DIVISION SAPHENOUS VEIN     both legs   PROSTATE BIOPSY     2010, (-)   TONSILLECTOMY     TOTAL KNEE ARTHROPLASTY Right 11/07/2013   Procedure: TOTAL KNEE ARTHROPLASTY;  Surgeon: Kerin Salen, MD;  Location: [place];  Service: Orthopedics;  Laterality: Right;   TOTAL KNEE ARTHROPLASTY Left 03/01/2014   Procedure: TOTAL  KNEE ARTHROPLASTY;  Surgeon: Kerin Salen, MD;  Location: [place];  Service: Orthopedics;  Laterality: Left;   UPPER GASTROINTESTINAL ENDOSCOPY     UVULOPALATOPHARYNGOPLASTY (UPPP)/TONSILLECTOMY/SEPTOPLASTY  1990    Allergies:  Allergies  Allergen Reactions   Codeine Itching   Nsaids     Acid Reflux   Simvastatin     Mimics heart attack   Tolmetin     Acid Reflux    Family History:  Family History  Problem Relation Age of Onset   CAD Father        ?   Alzheimer's disease Mother    Stroke Brother    Colon cancer Neg Hx    Prostate cancer Neg Hx    Diabetes Neg Hx    Cancer Neg Hx     Social History:  Social History   Tobacco Use  Smoking status: Former    Packs/day: 1.00    Years: 40.00    Total pack years: 40.00    Types: Cigarettes    Start date: 58    Quit date: 06/29/2008    Years since quitting: 14.1   Smokeless tobacco: Never   Tobacco comments:    quit 2010  Substance Use Topics   Alcohol use: Yes    Alcohol/week: 3.0 standard drinks of alcohol    Types: 3 Standard drinks or equivalent per week    Comment: 2-3 large drinks, Vodka, 1-2 times a week   Drug use: No    Review of symptoms:  Constitutional:  Negative for unexplained weight loss, night sweats, fever, chills ENT:  Negative for nose bleeds, sinus pain, painful swallowing CV:  Negative for chest pain, shortness of breath, exercise intolerance, palpitations, loss of consciousness Resp:  Negative for cough, wheezing, shortness of breath GI:  Negative for nausea, vomiting, diarrhea, bloody stools GU:  Positives noted in HPI; otherwise negative for gross hematuria, dysuria Neuro:  Negative for seizures, poor balance, limb weakness, slurred speech Psych:  Negative for lack of energy, depression, anxiety Endocrine:  Negative for polydipsia, polyuria, symptoms of hypoglycemia (dizziness, hunger, sweating) Hematologic:  Negative for anemia, purpura, petechia, prolonged or excessive bleeding, use of  anticoagulants  Allergic:  Negative for difficulty breathing or choking as a result of exposure to anything; no shellfish allergy; no allergic response (rash/itch) to materials, foods  Physical exam: BP (!) 172/82   Pulse 87   Ht '5\' 10"'$  (1.778 m)   Wt 299 lb (135.6 kg)   BMI 42.90 kg/m  GENERAL APPEARANCE:  Well appearing, well developed, well nourished, NAD HEENT: Atraumatic, Normocephalic, oropharynx clear. NECK: Supple without lymphadenopathy or thyromegaly. LUNGS: Clear to auscultation bilaterally. HEART: Regular Rate and Rhythm without murmurs, gallops, or rubs. ABDOMEN: Soft, non-tender, No Masses. EXTREMITIES: Moves all extremities well.  Without clubbing, cyanosis, or edema. NEUROLOGIC:  Alert and oriented x 3, normal gait, CN II-XII grossly intact.  MENTAL STATUS:  Appropriate. BACK:  Non-tender to palpation.  No CVAT SKIN:  Warm, dry and intact.   GU: Penis:  uncircumcised Meatus: Normal Scrotum: normal, no masses Testis: normal without masses bilateral Epididymis: normal Prostate: unable to feel base; NT Rectum: Normal tone,  no masses or tenderness  Results: U/A:  0-5 WBC, 0-2 RBC, >10 epithelial cells, mod bacteria  PVR = 37 ml

## 2022-09-05 LAB — URINE CULTURE

## 2022-09-08 MED ORDER — CIPROFLOXACIN HCL 500 MG PO TABS: 500.0000 mg | ORAL_TABLET | Freq: Two times a day (BID) | ORAL | 0 refills | Status: AC

## 2022-09-08 NOTE — Addendum Note (Signed)
Addended by: Primus Bravo on: 09/08/2022 09:31 AM   Modules accepted: Orders

## 2022-09-15 ENCOUNTER — Ambulatory Visit (HOSPITAL_BASED_OUTPATIENT_CLINIC_OR_DEPARTMENT_OTHER): Payer: Medicare Other

## 2022-09-17 ENCOUNTER — Ambulatory Visit (HOSPITAL_BASED_OUTPATIENT_CLINIC_OR_DEPARTMENT_OTHER): Payer: Medicare Other

## 2022-10-01 ENCOUNTER — Encounter: Payer: Self-pay | Admitting: Urology

## 2022-10-01 ENCOUNTER — Ambulatory Visit (HOSPITAL_BASED_OUTPATIENT_CLINIC_OR_DEPARTMENT_OTHER)
Admission: RE | Admit: 2022-10-01 | Discharge: 2022-10-01 | Disposition: A | Discharge status: Home / Self Care | Payer: Medicare Other | Source: Ambulatory Visit | Attending: Internal Medicine | Admitting: Internal Medicine

## 2022-10-01 ENCOUNTER — Ambulatory Visit: Payer: Medicare Other | Admitting: Urology

## 2022-10-01 DIAGNOSIS — R0601 Orthopnea: Secondary | ICD-10-CM

## 2022-10-01 LAB — ECHOCARDIOGRAM COMPLETE
AR max vel: 2.13 cm2
AV Area VTI: 2.3 cm2
AV Area mean vel: 2.32 cm2
AV Mean grad: 9.5 mmHg
AV Peak grad: 17.5 mmHg
Ao pk vel: 2.09 m/s
Area-P 1/2: 2.97 cm2
S' Lateral: 2.5 cm

## 2022-10-01 NOTE — Progress Notes (Deleted)
Assessment: 1. BPH with obstruction/lower urinary tract symptoms   2. Microscopic hematuria; prior evaluation 2009   3. Elevated PSA; negatvie biopsy 2009   4. Frequent UTI     Plan: Trial of Gemtesa 75 mg daily.  Samples provided. Will need repeat PSA after confirmation of a negative urine culture. Consider further evaluation with cystoscopy pending result of medication trial. Return to office in 4 weeks.  Chief Complaint:  No chief complaint on file.   History of Present Illness:  Robert Gibbs is a 72 y.o. male who is seen for continued evaluation of BPH with LUTS, microscopic hematuria, and elevated PSA. He was previously followed at [facility] in [place] and was last seen there by me in 12/21. He has a history of UTI's. Urine culture results: 2/22 50-100K Enterococcus 7/23 10K Enterococcus 12/23 >100K Enterococcus  He was seen on 09/02/22 for continued management of his lower urinary tract symptoms.  He continued to have urinary frequency, urgency, and nocturia 3-4 times.  He has had associated urge incontinence.  No dysuria or gross hematuria.  He reported voiding with a good stream.  He was not taking any medications for his urinary symptoms.  He does not remember if he took the Myrbetriq and if it improved his symptoms. IPSS = 26.  PVR = 37 ml. Urine culture from 09/02/2022 grew 10-25 K Enterococcus.  Treated with Cipro x 7 days. He was also given a trial of Gemtesa 75 mg daily.  Urologic History:  He has a history of elevated PSA. He had a previous negative prostate biopsy in June 2009.  PSA results: 6/09 4.7 4/13 2.36 10/15 2.92 12/16 3.63 12/16 40.33 5/19 25.47 2/22 7.54 12/23 9.95   He has a history of LUTS. He has tried various alpha blockers which did not help and were too expensive including Flomax, Rapaflo, and Uroxatral. Daily Cialis 5 mg worked the best for his LUTS but insurance would not approve this. He tried alfuzosin which did  not improve his symptoms. He reported weak FOS, frequency, urgency, incomplete emptying, and an increase in his frequency and urgency. He had intermittent episodes of urge incontinence.  He resumed Cialis 5 mg daily after his visit in 12/16 and his LUTS improved. He was seen by Dr. Drue Novel for dysuria, urgency, and strong urine odor. He was diagnosed with prostatitis and UTI. Urine culture grew E. Coli.  PSA was 40. He was initially treated with Bactrim x 4 weeks, then changed to Cipro x 2 weeks.  He was diagnosed with prostatitis and UTI in 5/19. Urine culture grew E. Coli. His symptoms resolved with antibiotics.  He was not taking daily tadalafil. AUA score = 31. He was diagnosed with prostatitis and treated with Cipro for 3 weeks. He noted some slight improvement in his symptoms. He continued to have urinary frequency, urgency, nocturia x 5, sensation of incomplete emptying and post void dribbling. No dysuria or gross hematuria. AUA score = 30.  He underwent microscopic hematuria evaluation with CT and cysto in May 2009. He was found to have a right renal cyst. Hematuria profile from 12/16 was negative for malignancy.   At his last visit in 12/21, he noted worsening of his urinary symptoms within the prior 1-2 months. He was admitted to the hospital in October 2021 with sepsis. He was also diagnosed with prostatitis. Urinalysis showed 57 RBCs, 10 WBCs. Urine culture grew <10K colonies. He reported increased frequency, urgency, nocturia x4, and urge incontinence.  His symptoms persisted following treatment. No dysuria or gross hematuria. He continued to have frequency, nocturia x4, urgency, and urge incontinence. AUA score = 31. He was not on any medical therapy for his urinary symptoms at that time. He was given a trial of Myrbetriq 50 mg daily.  Portions of the above documentation were copied from a prior visit for review purposes only.    Past Medical History:  Past Medical History:  Diagnosis  Date   Anxiety and depression    Arthritis    Barrett esophagus    BPH (benign prostatic hyperplasia)    CTS (carpal tunnel syndrome)    h/o   Edema of both legs    legs , ankles and feet   Erectile dysfunction    GERD (gastroesophageal reflux disease) 03/18/2012   H/O hiatal hernia    HTN (hypertension)    Hyperlipidemia    Microscopic hematuria    Neuropathy    OSA (obstructive sleep apnea) 03/18/2012   on BiPap   Prostatitis    PVD (peripheral vascular disease) ([facility])    Spinal stenosis 06-2012   saw neuro w/ "neuropathy", dx w/ spinal stenosis   Varicose veins    Vitamin D deficiency    Wears glasses     Past Surgical History:  Past Surgical History:  Procedure Laterality Date   ARCH BAR REMOVAL     BACK SURGERY  2002   L4-L5   ELBOW SURGERY  1992   for tennis elbow-rt   ESOPHAGOGASTRODUODENOSCOPY  04/2015   w/ biopsy, short tongue of Barrett's without obvious nodularity, mass or esophagitis seen   EYE SURGERY Right    cataract with lens implant   FOOT SURGERY     R, for plantar fasciitis   HERNIA REPAIR     x 2 as a child-ing    KNEE ARTHROSCOPY WITH MENISCAL REPAIR Bilateral 05/04/2013   Procedure: BILATERAL KNEE ARTHROSCOPY WITH POSSIBLE CHONDROPLASTY AND MENISCECTOMY ;  Surgeon: Nestor Lewandowsky, MD;  Location: [facility];  Service: Orthopedics;  Laterality: Bilateral;  Right chrondroplasty, debridement, , partial medial menisectomy, removal of loose bodies.    LIGATION / DIVISION SAPHENOUS VEIN     both legs   PROSTATE BIOPSY     2010, (-)   TONSILLECTOMY     TOTAL KNEE ARTHROPLASTY Right 11/07/2013   Procedure: TOTAL KNEE ARTHROPLASTY;  Surgeon: Nestor Lewandowsky, MD;  Location: [facility];  Service: Orthopedics;  Laterality: Right;   TOTAL KNEE ARTHROPLASTY Left 03/01/2014   Procedure: TOTAL KNEE ARTHROPLASTY;  Surgeon: Nestor Lewandowsky, MD;  Location: [facility];  Service: Orthopedics;  Laterality: Left;   UPPER GASTROINTESTINAL ENDOSCOPY      UVULOPALATOPHARYNGOPLASTY (UPPP)/TONSILLECTOMY/SEPTOPLASTY  1990    Allergies:  Allergies  Allergen Reactions   Codeine Itching   Nsaids     Acid Reflux   Simvastatin     Mimics heart attack   Tolmetin     Acid Reflux    Family History:  Family History  Problem Relation Age of Onset   CAD Father        ?   Alzheimer's disease Mother    Stroke Brother    Colon cancer Neg Hx    Prostate cancer Neg Hx    Diabetes Neg Hx    Cancer Neg Hx     Social History:  Social History   Tobacco Use   Smoking status: Former    Packs/day: 1.00    Years: 40.00  Additional pack years: 0.00    Total pack years: 40.00    Types: Cigarettes    Start date: [id number]    Quit date: 06/29/2008    Years since quitting: 14.2   Smokeless tobacco: Never   Tobacco comments:    quit 2010  Substance Use Topics   Alcohol use: Yes    Alcohol/week: 3.0 standard drinks of alcohol    Types: 3 Standard drinks or equivalent per week    Comment: 2-3 large drinks, Vodka, 1-2 times a week   Drug use: No    ROS: Constitutional:  Negative for fever, chills, weight loss CV: Negative for chest pain, previous MI, hypertension Respiratory:  Negative for shortness of breath, wheezing, sleep apnea, frequent cough GI:  Negative for nausea, vomiting, bloody stool, GERD  Physical exam: There were no vitals taken for this visit. GENERAL APPEARANCE:  Well appearing, well developed, well nourished, NAD HEENT:  Atraumatic, normocephalic, oropharynx clear NECK:  Supple without lymphadenopathy or thyromegaly ABDOMEN:  Soft, non-tender, no masses EXTREMITIES:  Moves all extremities well, without clubbing, cyanosis, or edema NEUROLOGIC:  Alert and oriented x 3, normal gait, CN II-XII grossly intact MENTAL STATUS:  appropriate BACK:  Non-tender to palpation, No CVAT SKIN:  Warm, dry, and intact  Results: U/A:

## 2022-10-09 ENCOUNTER — Telehealth: Payer: Self-pay | Admitting: Internal Medicine

## 2022-10-09 ENCOUNTER — Other Ambulatory Visit: Payer: Self-pay | Admitting: Internal Medicine

## 2022-10-09 NOTE — Telephone Encounter (Signed)
Rx sent 

## 2022-10-09 NOTE — Telephone Encounter (Signed)
Pt is completely out of rx and requesting a 90 day supply.   metoprolol succinate (TOPROL-XL) 100 MG 24 hr tablet   [id number] - [place], [place] - [address] AT [facility] [address], [place] [id number] Phone: [phone]  Fax: [phone]

## 2022-10-10 MED ORDER — METOPROLOL SUCCINATE ER 100 MG PO TB24: 200.0000 mg | ORAL_TABLET | Freq: Every day | ORAL | 1 refills | Status: DC

## 2022-10-10 NOTE — Telephone Encounter (Signed)
Chart reviewed. Metoprolol xl - 2 tablets daily sent to pharmacy.

## 2022-10-10 NOTE — Telephone Encounter (Signed)
Pt called stating that the Rx for metoprolol that was sent in was for an old dosage he was taking. After reviewing chart, noticed that the dosage was upped from 1.5 tablets to 2 on 12.26.23. Pt would like to have the correct dosage sent in so he can pick up his medication.

## 2022-10-10 NOTE — Addendum Note (Signed)
Addended byConrad [place] D on: 10/10/2022 01:24 PM   Modules accepted: Orders

## 2022-11-18 ENCOUNTER — Telehealth: Payer: Self-pay | Admitting: Internal Medicine

## 2022-11-18 NOTE — Telephone Encounter (Signed)
Requesting: Ambien 10mg   Contract: 07/29/22 UDS: Ambien only Last Visit: 08/18/22 Next Visit: 12/17/22 Last Refill: 04/08/22 #30 and 1RF  Please Advise

## 2022-11-18 NOTE — Telephone Encounter (Signed)
PDMP okay, Rx sent 

## 2022-12-17 ENCOUNTER — Encounter: Payer: Self-pay | Admitting: Internal Medicine

## 2022-12-17 ENCOUNTER — Ambulatory Visit (INDEPENDENT_AMBULATORY_CARE_PROVIDER_SITE_OTHER): Payer: Medicare Other | Admitting: Internal Medicine

## 2022-12-17 VITALS — BP 132/82 | HR 56 | Ht 70.0 in | Wt 295.0 lb

## 2022-12-17 DIAGNOSIS — I10 Essential (primary) hypertension: Secondary | ICD-10-CM | POA: Diagnosis not present

## 2022-12-17 DIAGNOSIS — E559 Vitamin D deficiency, unspecified: Secondary | ICD-10-CM | POA: Diagnosis not present

## 2022-12-17 DIAGNOSIS — G4733 Obstructive sleep apnea (adult) (pediatric): Secondary | ICD-10-CM | POA: Diagnosis not present

## 2022-12-17 DIAGNOSIS — Z122 Encounter for screening for malignant neoplasm of respiratory organs: Secondary | ICD-10-CM

## 2022-12-17 NOTE — Progress Notes (Unsigned)
Subjective:    Patient ID: Robert Gibbs, male    DOB: [birth year], 72 y.o.   MRN: [id number]  DOS:  12/17/2022 Type of visit - description: Routine follow-up  Reports she is doing well. Has no concerns. LUTS: At baseline, denies fever chills or blood in the stools.   Review of Systems See above   Past Medical History:  Diagnosis Date   Anxiety and depression    Arthritis    Barrett esophagus    BPH (benign prostatic hyperplasia)    CTS (carpal tunnel syndrome)    h/o   Edema of both legs    legs , ankles and feet   Erectile dysfunction    GERD (gastroesophageal reflux disease) 03/18/2012   H/O hiatal hernia    HTN (hypertension)    Hyperlipidemia    Microscopic hematuria    Neuropathy    OSA (obstructive sleep apnea) 03/18/2012   on BiPap   Prostatitis    PVD (peripheral vascular disease) (HCC)    Spinal stenosis 06-2012   saw neuro w/ "neuropathy", dx w/ spinal stenosis   Varicose veins    Vitamin D deficiency    Wears glasses     Past Surgical History:  Procedure Laterality Date   ARCH BAR REMOVAL     BACK SURGERY  2002   L4-L5   ELBOW SURGERY  1992   for tennis elbow-rt   ESOPHAGOGASTRODUODENOSCOPY  04/2015   w/ biopsy, short tongue of Barrett's without obvious nodularity, mass or esophagitis seen   EYE SURGERY Right    cataract with lens implant   FOOT SURGERY     R, for plantar fasciitis   HERNIA REPAIR     x 2 as a child-ing    KNEE ARTHROSCOPY WITH MENISCAL REPAIR Bilateral 05/04/2013   Procedure: BILATERAL KNEE ARTHROSCOPY WITH POSSIBLE CHONDROPLASTY AND MENISCECTOMY ;  Surgeon: Nestor Lewandowsky, MD;  Location: [facility];  Service: Orthopedics;  Laterality: Bilateral;  Right chrondroplasty, debridement, , partial medial menisectomy, removal of loose bodies.    LIGATION / DIVISION SAPHENOUS VEIN     both legs   PROSTATE BIOPSY     2010, (-)   TONSILLECTOMY     TOTAL KNEE ARTHROPLASTY Right 11/07/2013   Procedure: TOTAL KNEE  ARTHROPLASTY;  Surgeon: Nestor Lewandowsky, MD;  Location: [facility] OR;  Service: Orthopedics;  Laterality: Right;   TOTAL KNEE ARTHROPLASTY Left 03/01/2014   Procedure: TOTAL KNEE ARTHROPLASTY;  Surgeon: Nestor Lewandowsky, MD;  Location: [facility] OR;  Service: Orthopedics;  Laterality: Left;   UPPER GASTROINTESTINAL ENDOSCOPY     UVULOPALATOPHARYNGOPLASTY (UPPP)/TONSILLECTOMY/SEPTOPLASTY  1990    Current Outpatient Medications  Medication Instructions   albuterol (VENTOLIN HFA) 108 (90 Base) MCG/ACT inhaler 2 puffs, Inhalation, Every 6 hours PRN   aspirin 81 mg, Oral, Daily   buPROPion (WELLBUTRIN XL) 300 mg, Oral, Daily   Calcium Carbonate-Vit D-Min (CALCIUM 1200) 1200-1000 MG-UNIT CHEW 1 tablet, Daily   Coenzyme Q10 200 mg, Daily   escitalopram (LEXAPRO) 20 mg, Oral, Daily   ezetimibe (ZETIA) 10 MG tablet TAKE 1 TABLET(10 MG) BY MOUTH DAILY   gabapentin (NEURONTIN) 800 mg, Oral, Daily at bedtime   Gemtesa 75 mg, Oral, Daily   lisinopril (ZESTRIL) 40 MG tablet TAKE 1 TABLET(40 MG) BY MOUTH DAILY   magnesium oxide (MAG-OX) 400 mg, Daily   Melatonin 10 mg, Daily at bedtime   metoprolol succinate (TOPROL-XL) 200 mg, Oral, Daily, Take with or immediately following a meal  Multiple Vitamins-Minerals (MULTIVITAMIN WITH MINERALS) tablet 1 tablet, Daily   Omega-3 Fat Ac-Cholecalciferol (FLEX OMEGA BENEFITS/VIT D-3 PO) 2 capsules, Daily   omeprazole (PRILOSEC) 40 MG capsule TAKE 1 CAPSULE(40 MG) BY MOUTH DAILY BEFORE BREAKFAST   rosuvastatin (CRESTOR) 10 mg, Oral, Daily at bedtime   triamcinolone (NASACORT) 55 MCG/ACT AERO nasal inhaler 2 sprays, Daily at bedtime   vitamin C 1,000 mg, Daily   Vitamin D (Ergocalciferol) (DRISDOL) 50,000 Units, Oral, Every 7 days   zolpidem (AMBIEN) 10 MG tablet TAKE 1/2 TO 1 TABLET(5 TO 10 MG) BY MOUTH AT BEDTIME AS NEEDED FOR SLEEP       Objective:   Physical Exam BP 132/82 (BP Location: Left Arm, Patient Position: Sitting, Cuff Size: Large)   Pulse (!) 56   Ht 5\' 10"  (1.778  m)   Wt 295 lb (133.8 kg)   SpO2 95%   BMI 42.33 kg/m  General:   Well developed, NAD, BMI noted. HEENT:  Normocephalic . Face symmetric, atraumatic Lungs:  CTA B Normal respiratory effort, no intercostal retractions, no accessory muscle use. Heart: RRR,  no murmur.  Lower extremities: no pretibial edema bilaterally  Skin: Not pale. Not jaundice Neurologic:  alert & oriented X3.  Speech normal, gait appropriate for age and unassisted Psych--  Cognition and judgment appear intact.  Cooperative with normal attention span and concentration.  Behavior appropriate. No anxious or depressed appearing.      Assessment     Assessment Hyperglycemia HTN Hyperlipidemia: s/e  with simvastatin, declined other statins 08-2019 Chronic lower extremity edema w/ some pretibial redness  (ER 2-17, [place] neg DVT, BNP wnl) anxiety depression, insomnia COPD: Per CT 03-2016 Coronary calcifications per CTA. Morbid obesity GI: GERD, Barrett's  esophagus OSA, BiPAP, per pulmonary, not using frequently as of 07-2021  GU: Dr Pete Glatter  --LUTS , failed a # of medications --BPH, elevated PSA:  (-)  biopsy 2009, PSA 05/2014 2.92 --ED --Hematuria, CT, cystoscopy 2009 negative --Prostatitis, admitted 03-2020 Vitamin D deficiency  MSK: -- Spinal stenosis : saw neurology w/ sx of paresthesias feet-ankle, sx felt to be d/t spinal stenosis not neuropathy, see OV note 08-07-2012 , used to see pain med (Dr Jordan Likes) -High Risk UDS 2016 --CTS Diastasis recti  PLAN HTN: BP okay, on lisinopril, metoprolol.  Check a BMP. Hyperlipidemia: Controlled on Zetia and rosuvastatin. Morbid obesity: Has not make any serious efforts to lose weight in a while but he would like to.  BMI is 42, I think will need a lot of support, recommend to be seen at the wellness clinic, information provided.  In the meantime recommend portion control, decrease carbohydrate intakes. COPD: Reports sometimes feels sleepy, not using a CPAP  regularly.  Encouraged to reach out to pulmonary, he would like to see about different modalities of treatment. Orthopnea: See previous visit, echo was essentially normal except for borderline LVH. BPH, chronic LUTS.   Saw urology 09/02/2022 urine culture showed Enterococcus, Rx antibiotics, was rec to RTC in 4 weeks but he did not.  Encouraged to call them. Ventricular: Vaccine advice provided, put order for a lung cancer screening. Vitamin D deficiency: S/p ergocalciferol, recommend OTCs. RTC 4 months.  2--  Hyperglycemia: Last A1c 5.6. High cholesterol: LDL was 79 back in December, patient on Zetia, rosuvastatin was added, + Cornary calcifications, goal LDL= 70.  Checking labs. Orthopnea: Symptoms are at baseline, patient thinks his anxiety, I recommended a ECHO but he  [facility] BPH, chronic LUTS: Has not seen urology.  Explained  patient the need to see his urologist due to increased PSA and a + urine culture few weeks ago (not treated w/  abx)  Vitamin D insufficiency: Levels remain low in December, was recommended ergocalciferol OSA: Not using CPAP frequently RTC 4 months

## 2022-12-17 NOTE — Patient Instructions (Addendum)
Recommend to see your pulmonologist regards to sleep apnea.  You can call them at [phone]  Recommend to follow-up with the urologist as recommended  Recommend to reach out to the wellness clinic, you need to make significant changes on your diet in order to lose weight.  Please see the information provided regarding obesity.  Take vitamin D 2000 units every day.  Vaccines I recommend: Shingrix (shingles) #2 RSV vaccine Tdap (tetanus) Flu shot this fall   Check the  blood pressure regularly BP GOAL is between 110/65 and  135/85. If it is consistently higher or lower, let me know   GO TO THE LAB : Get the blood work     GO TO THE FRONT DESK, PLEASE SCHEDULE YOUR APPOINTMENTS Come back for   a checkup in 4 to 5 months    Obesity, Adult Obesity is the condition of having too much total body fat. Being overweight or obese means that your weight is greater than what is considered healthy for your body size. Obesity is determined by a measurement called BMI (body mass index). BMI is an estimate of body fat and is calculated from height and weight. For adults, a BMI of 30 or higher is considered obese. Obesity can lead to other health concerns and major illnesses, including: Stroke. Coronary artery disease (CAD). Type 2 diabetes. Some types of cancer, including cancers of the colon, breast, uterus, and gallbladder. High blood pressure (hypertension). High cholesterol. Gallbladder stones. Obesity can also contribute to: Osteoarthritis. Sleep apnea. Infertility problems. What are the causes? Common causes of this condition include: Eating daily meals that are high in calories, sugar, and fat. Drinking high amounts of sugar-sweetened beverages, such as soft drinks. Being born with genes that may make you more likely to become obese. Having a medical condition that causes obesity, including: Hypothyroidism. Polycystic ovarian syndrome (PCOS). Binge-eating disorder. Cushing  syndrome. Taking certain medicines, such as steroids, antidepressants, and seizure medicines. Not being physically active (sedentary lifestyle). Not getting enough sleep. What increases the risk? The following factors may make you more likely to develop this condition: Having a family history of obesity. Living in an area with limited access to: [place], recreation centers, or sidewalks. Healthy food choices, such as grocery stores and farmers' markets. What are the signs or symptoms? The main sign of this condition is having too much body fat. How is this diagnosed? This condition is diagnosed based on: Your BMI. If you are an adult with a BMI of 30 or higher, you are considered obese. Your waist circumference. This measures the distance around your waistline. Your skinfold thickness. Your health care provider may gently pinch a fold of your skin and measure it. You may have other tests to check for underlying conditions. How is this treated? Treatment for this condition often includes changing your lifestyle. Treatment may include some or all of the following: Dietary changes. This may include developing a healthy meal plan. Regular physical activity. This may include activity that causes your heart to beat faster (aerobic exercise) and strength training. Work with your health care provider to design an exercise program that works for you. Medicine to help you lose weight if you are unable to lose one pound a week after six weeks of healthy eating and more physical activity. Treating conditions that cause the obesity (underlying conditions). Surgery. Surgical options may include gastric banding and gastric bypass. Surgery may be done if: Other treatments have not helped to improve your condition. You have  a BMI of 40 or higher. You have life-threatening health problems related to obesity. Follow these instructions at home: Eating and drinking  Follow recommendations from your health  care provider about what you eat and drink. Your health care provider may advise you to: Limit fast food, sweets, and processed snack foods. Choose low-fat options, such as low-fat milk instead of whole milk. Eat five or more servings of fruits or vegetables every day. Choose healthy foods when you eat out. Keep low-fat snacks available. Limit sugary drinks, such as soda, fruit juice, sweetened iced tea, and flavored milk. Drink enough water to keep your urine pale yellow. Do not follow a fad diet. Fad diets can be unhealthy and even dangerous. Other healthful choices include: Eat at home more often. This gives you more control over what you eat. Learn to read food labels. This will help you understand how much food is considered one serving. Learn what a healthy serving size is. Physical activity Exercise regularly, as told by your health care provider. Most adults should get up to 150 minutes of moderate-intensity exercise every week. Ask your health care provider what types of exercise are safe for you and how often you should exercise. Warm up and stretch before being active. Cool down and stretch after being active. Rest between periods of activity. Lifestyle Work with your health care provider and a dietitian to set a weight-loss goal that is healthy and reasonable for you. Limit your screen time. Find ways to reward yourself that do not involve food. Do not drink alcohol if: Your health care provider tells you not to drink. You are pregnant, may be pregnant, or are planning to become pregnant. If you drink alcohol: Limit how much you have to: 0-1 drink a day for women. 0-2 drinks a day for men. Know how much alcohol is in your drink. In the [place] one drink equals one 12 oz bottle of beer (355 mL), one 5 oz glass of wine (148 mL), or one 1 oz glass of hard liquor (44 mL). General instructions Keep a weight-loss journal to keep track of the food you eat and how much exercise  you get. Take over-the-counter and prescription medicines only as told by your health care provider. Take vitamins and supplements only as told by your health care provider. Consider joining a support group. Your health care provider may be able to recommend a support group. Pay attention to your mental health as obesity can lead to depression or self esteem issues. Keep all follow-up visits. This is important. Contact a health care provider if: You are unable to meet your weight-loss goal after six weeks of dietary and lifestyle changes. You have trouble breathing. Summary Obesity is the condition of having too much total body fat. Being overweight or obese means that your weight is greater than what is considered healthy for your body size. Work with your health care provider and a dietitian to set a weight-loss goal that is healthy and reasonable for you. Exercise regularly, as told by your health care provider. Ask your health care provider what types of exercise are safe for you and how often you should exercise. This information is not intended to replace advice given to you by your health care provider. Make sure you discuss any questions you have with your health care provider. Document Revised: 01/15/2021 Document Reviewed: 01/15/2021 Elsevier Patient Education  2024 [employer].

## 2022-12-18 LAB — BASIC METABOLIC PANEL
BUN: 12 mg/dL (ref 6–23)
CO2: 29 mEq/L (ref 19–32)
Calcium: 9.2 mg/dL (ref 8.4–10.5)
Chloride: 101 mEq/L (ref 96–112)
Creatinine, Ser: 0.85 mg/dL (ref 0.40–1.50)
GFR: 87.02 mL/min (ref >60.00)
Glucose, Bld: 107 mg/dL — ABNORMAL HIGH (ref 70–99)
Potassium: 4.4 mEq/L (ref 3.5–5.1)
Sodium: 139 mEq/L (ref 135–145)

## 2022-12-18 LAB — VITAMIN D 25 HYDROXY (VIT D DEFICIENCY, FRACTURES): VITD: 21.17 ng/mL — ABNORMAL LOW (ref 30.00–100.00)

## 2022-12-18 NOTE — Assessment & Plan Note (Signed)
HTN: BP okay, on lisinopril, metoprolol.  Check a BMP. Hyperlipidemia: Controlled on Zetia and rosuvastatin. Morbid obesity: Has not make any serious efforts to lose weight in a while but he would like to.  BMI is 42, I think will need a lot of support, recommend to be seen at the wellness clinic, information provided.  In the meantime recommend portion control, decrease carbohydrate intakes. COPD: Reports sometimes feels sleepy, not using a CPAP regularly.  Encouraged to reach out to pulmonary, he would like to see about different modalities of treatment. Orthopnea: See previous visit, echo was essentially normal except for borderline LVH. BPH, chronic LUTS.   Saw urology 09/02/2022 urine culture showed Enterococcus, Rx antibiotics, was rec to RTC in 4 weeks but he did not.  Encouraged to call them. Vitamin D deficiency: S/p ergocalciferol, recommend OTCs. Preventive care: Vaccine advice provided, placed order for a lung cancer screening. RTC 4 months.

## 2022-12-22 MED ORDER — VITAMIN D (ERGOCALCIFEROL) 1.25 MG (50000 UNIT) PO CAPS: 50000.0000 [IU] | ORAL_CAPSULE | ORAL | 0 refills | Status: AC

## 2022-12-22 NOTE — Addendum Note (Signed)
Addended byConrad [place] D on: 12/22/2022 03:01 PM   Modules accepted: Orders

## 2023-01-09 ENCOUNTER — Other Ambulatory Visit: Payer: Self-pay | Admitting: Internal Medicine

## 2023-03-13 ENCOUNTER — Telehealth: Payer: Self-pay

## 2023-03-13 DIAGNOSIS — Z23 Encounter for immunization: Secondary | ICD-10-CM | POA: Diagnosis not present

## 2023-03-13 NOTE — Telephone Encounter (Signed)
Returned call from VM to schedule annual LDCT.  No answer and patient VM is full. Unable to leave a message.

## 2023-04-15 ENCOUNTER — Encounter: Payer: Self-pay | Admitting: *Deleted

## 2023-04-15 NOTE — Telephone Encounter (Signed)
Pt called and LVM. Attempted to contact pt but unable to leave VM as VM was full. Letter mailed to pt.

## 2023-04-16 ENCOUNTER — Other Ambulatory Visit: Payer: Self-pay

## 2023-04-16 DIAGNOSIS — Z87891 Personal history of nicotine dependence: Secondary | ICD-10-CM

## 2023-04-16 DIAGNOSIS — Z122 Encounter for screening for malignant neoplasm of respiratory organs: Secondary | ICD-10-CM

## 2023-04-20 ENCOUNTER — Encounter: Payer: Self-pay | Admitting: Internal Medicine

## 2023-04-20 ENCOUNTER — Ambulatory Visit: Payer: Medicare Other | Admitting: Internal Medicine

## 2023-04-27 ENCOUNTER — Ambulatory Visit (HOSPITAL_BASED_OUTPATIENT_CLINIC_OR_DEPARTMENT_OTHER)
Admission: RE | Admit: 2023-04-27 | Discharge: 2023-04-27 | Disposition: A | Discharge status: Home / Self Care | Payer: Medicare Other | Source: Ambulatory Visit | Attending: Acute Care | Admitting: Acute Care

## 2023-04-27 DIAGNOSIS — Z122 Encounter for screening for malignant neoplasm of respiratory organs: Secondary | ICD-10-CM | POA: Insufficient documentation

## 2023-04-27 DIAGNOSIS — Z87891 Personal history of nicotine dependence: Secondary | ICD-10-CM | POA: Diagnosis not present

## 2023-05-07 ENCOUNTER — Other Ambulatory Visit: Payer: Self-pay | Admitting: Internal Medicine

## 2023-05-11 ENCOUNTER — Ambulatory Visit (INDEPENDENT_AMBULATORY_CARE_PROVIDER_SITE_OTHER): Payer: Medicare Other | Admitting: Internal Medicine

## 2023-05-11 ENCOUNTER — Encounter: Payer: Self-pay | Admitting: Internal Medicine

## 2023-05-11 ENCOUNTER — Telehealth: Payer: Self-pay | Admitting: Urology

## 2023-05-11 VITALS — BP 138/68 | HR 61 | Temp 97.7°F | Resp 18 | Ht 70.0 in | Wt 298.4 lb

## 2023-05-11 DIAGNOSIS — N401 Enlarged prostate with lower urinary tract symptoms: Secondary | ICD-10-CM

## 2023-05-11 DIAGNOSIS — I10 Essential (primary) hypertension: Secondary | ICD-10-CM

## 2023-05-11 DIAGNOSIS — E785 Hyperlipidemia, unspecified: Secondary | ICD-10-CM | POA: Diagnosis not present

## 2023-05-11 DIAGNOSIS — R739 Hyperglycemia, unspecified: Secondary | ICD-10-CM

## 2023-05-11 DIAGNOSIS — F419 Anxiety disorder, unspecified: Secondary | ICD-10-CM | POA: Diagnosis not present

## 2023-05-11 DIAGNOSIS — F32A Depression, unspecified: Secondary | ICD-10-CM

## 2023-05-11 DIAGNOSIS — G4700 Insomnia, unspecified: Secondary | ICD-10-CM | POA: Diagnosis not present

## 2023-05-11 LAB — CBC WITH DIFFERENTIAL/PLATELET
Basophils Absolute: 0 10*3/uL (ref 0.0–0.1)
Basophils Relative: 0.5 % (ref 0.0–3.0)
Eosinophils Absolute: 0.2 10*3/uL (ref 0.0–0.7)
Eosinophils Relative: 2.7 % (ref 0.0–5.0)
HCT: 42.6 % (ref 39.0–52.0)
Hemoglobin: 14.6 g/dL (ref 13.0–17.0)
Lymphocytes Relative: 27.2 % (ref 12.0–46.0)
Lymphs Abs: 1.9 10*3/uL (ref 0.7–4.0)
MCHC: 34.2 g/dL (ref 30.0–36.0)
MCV: 98.6 fL (ref 78.0–100.0)
Monocytes Absolute: 0.5 10*3/uL (ref 0.1–1.0)
Monocytes Relative: 7.5 % (ref 3.0–12.0)
Neutro Abs: 4.3 10*3/uL (ref 1.4–7.7)
Neutrophils Relative %: 62.1 % (ref 43.0–77.0)
Platelets: 203 10*3/uL (ref 150.0–400.0)
RBC: 4.32 Mil/uL (ref 4.22–5.81)
RDW: 13.9 % (ref 11.5–15.5)
WBC: 6.9 10*3/uL (ref 4.0–10.5)

## 2023-05-11 LAB — HEMOGLOBIN A1C: Hgb A1c MFr Bld: 5.5 % (ref 4.6–6.5)

## 2023-05-11 MED ORDER — ZOLPIDEM TARTRATE 10 MG PO TABS: 5.0000 mg | ORAL_TABLET | Freq: Every evening | ORAL | 1 refills | Status: DC | PRN

## 2023-05-11 NOTE — Telephone Encounter (Signed)
Pt was here beginning of the year. Stopped by to see when he should follow up with you. He said nothing is going on with him and that he is fine.

## 2023-05-11 NOTE — Patient Instructions (Addendum)
Please reach out to your urologist and arrange for a routine visit.  Vaccines I recommend: Shingrix (shingles) #2 Tdap (tetanus)    Start checking your blood pressure regularly.  If you don't have the cuff, you could do it at the pharmacy Blood pressure goal:  between 110/65 and  135/85. If it is consistently higher or lower, let me know     GO TO THE LAB : Get the blood work     Next visit with me in 6 months. Please schedule it at the front desk      "Health Care Power of attorney" ,  "Living will" (Advance care planning documents)  If you already have a living will or healthcare power of attorney, is recommended you bring the copy to be scanned in your chart.   The document will be available to all the doctors you see in the system.  Advance care planning is a process that supports adults in  understanding and sharing their preferences regarding future medical care.  The patient's preferences are recorded in documents called Advance Directives and the can be modified at any time while the patient is in full mental capacity.   If you don't have one, please consider create one.      More information at: [email or web address]

## 2023-05-11 NOTE — Progress Notes (Unsigned)
Subjective:    Patient ID: Robert Gibbs, male    DOB: 07/15/1950, 72 y.o.   MRN: [id number]  DOS:  05/11/2023 Type of visit - description: Follow-up  Since the last office is doing about the same, has no new symptoms or concerns. Still have some LUTS: Urinary urgency, frequency.  Review of Systems See above   Past Medical History:  Diagnosis Date   Anxiety and depression    Arthritis    Barrett esophagus    BPH (benign prostatic hyperplasia)    CTS (carpal tunnel syndrome)    h/o   Edema of both legs    legs , ankles and feet   Erectile dysfunction    GERD (gastroesophageal reflux disease) 03/18/2012   H/O hiatal hernia    HTN (hypertension)    Hyperlipidemia    Microscopic hematuria    Neuropathy    OSA (obstructive sleep apnea) 03/18/2012   on BiPap   Prostatitis    PVD (peripheral vascular disease) (HCC)    Spinal stenosis 06-2012   saw neuro w/ "neuropathy", dx w/ spinal stenosis   Varicose veins    Vitamin D deficiency    Wears glasses     Past Surgical History:  Procedure Laterality Date   ARCH BAR REMOVAL     BACK SURGERY  2002   L4-L5   ELBOW SURGERY  1992   for tennis elbow-rt   ESOPHAGOGASTRODUODENOSCOPY  04/2015   w/ biopsy, short tongue of Barrett's without obvious nodularity, mass or esophagitis seen   EYE SURGERY Right    cataract with lens implant   FOOT SURGERY     R, for plantar fasciitis   HERNIA REPAIR     x 2 as a child-ing    KNEE ARTHROSCOPY WITH MENISCAL REPAIR Bilateral 05/04/2013   Procedure: BILATERAL KNEE ARTHROSCOPY WITH POSSIBLE CHONDROPLASTY AND MENISCECTOMY ;  Surgeon: Nestor Lewandowsky, MD;  Location: [facility];  Service: Orthopedics;  Laterality: Bilateral;  Right chrondroplasty, debridement, , partial medial menisectomy, removal of loose bodies.    LIGATION / DIVISION SAPHENOUS VEIN     both legs   PROSTATE BIOPSY     2010, (-)   TONSILLECTOMY     TOTAL KNEE ARTHROPLASTY Right 11/07/2013   Procedure:  TOTAL KNEE ARTHROPLASTY;  Surgeon: Nestor Lewandowsky, MD;  Location: [facility];  Service: Orthopedics;  Laterality: Right;   TOTAL KNEE ARTHROPLASTY Left 03/01/2014   Procedure: TOTAL KNEE ARTHROPLASTY;  Surgeon: Nestor Lewandowsky, MD;  Location: [facility];  Service: Orthopedics;  Laterality: Left;   UPPER GASTROINTESTINAL ENDOSCOPY     UVULOPALATOPHARYNGOPLASTY (UPPP)/TONSILLECTOMY/SEPTOPLASTY  1990    Current Outpatient Medications  Medication Instructions   albuterol (VENTOLIN HFA) 108 (90 Base) MCG/ACT inhaler 2 puffs, Inhalation, Every 6 hours PRN   aspirin 81 mg, Oral, Daily   buPROPion (WELLBUTRIN XL) 300 mg, Oral, Daily   Calcium Carbonate-Vit D-Min (CALCIUM 1200) 1200-1000 MG-UNIT CHEW 1 tablet, Daily   Coenzyme Q10 200 mg, Daily   escitalopram (LEXAPRO) 20 MG tablet TAKE 1 TABLET(20 MG) BY MOUTH DAILY   ezetimibe (ZETIA) 10 MG tablet TAKE 1 TABLET(10 MG) BY MOUTH DAILY   gabapentin (NEURONTIN) 800 mg, Oral, Daily at bedtime   Gemtesa 75 mg, Oral, Daily   lisinopril (ZESTRIL) 40 MG tablet TAKE 1 TABLET(40 MG) BY MOUTH DAILY   magnesium oxide (MAG-OX) 400 mg, Daily   Melatonin 10 mg, Daily at bedtime   metoprolol succinate (TOPROL-XL) 200 mg, Oral, Daily, Take with  or immediately following a meal   Multiple Vitamins-Minerals (MULTIVITAMIN WITH MINERALS) tablet 1 tablet, Daily   Omega-3 Fat Ac-Cholecalciferol (FLEX OMEGA BENEFITS/VIT D-3 PO) 2 capsules, Daily   omeprazole (PRILOSEC) 40 MG capsule TAKE 1 CAPSULE(40 MG) BY MOUTH DAILY BEFORE BREAKFAST   rosuvastatin (CRESTOR) 10 mg, Oral, Daily at bedtime   triamcinolone (NASACORT) 55 MCG/ACT AERO nasal inhaler 2 sprays, Daily at bedtime   vitamin C 1,000 mg, Daily   zolpidem (AMBIEN) 10 MG tablet TAKE 1/2 TO 1 TABLET(5 TO 10 MG) BY MOUTH AT BEDTIME AS NEEDED FOR SLEEP       Objective:   Physical Exam BP 138/68   Pulse 61   Temp 97.7 F (36.5 C) (Oral)   Resp 18   Ht 5\' 10"  (1.778 m)   Wt 298 lb 6 oz (135.3 kg)   SpO2 96%   BMI 42.81  kg/m  General:   Well developed, NAD, BMI noted.  HEENT:  Normocephalic . Face symmetric, atraumatic Lungs:  CTA B Normal respiratory effort, no intercostal retractions, no accessory muscle use. Heart: RRR,  no murmur.  Abdomen:  Not distended, soft, non-tender. No rebound or rigidity.   Skin: Not pale. Not jaundice Lower extremities: no pretibial edema bilaterally  Neurologic:  alert & oriented X3.  Speech normal, gait appropriate for age and unassisted Psych--  Cognition and judgment appear intact.  Cooperative with normal attention span and concentration.  Behavior appropriate. No anxious or depressed appearing.     Assessment   Assessment Hyperglycemia HTN Hyperlipidemia: s/e  with simvastatin, declined other statins 08-2019 Chronic lower extremity edema w/ some pretibial redness  (ER 2-17, [place] neg DVT, BNP wnl) anxiety depression, insomnia COPD: Per CT 03-2016 Coronary calcifications per CTA. Morbid obesity GI: GERD, Barrett's  esophagus OSA, BiPAP, per pulmonary, not using frequently as of 07-2021  GU: Dr Pete Glatter  --LUTS , failed a # of medications --BPH, elevated PSA:  (-)  biopsy 2009, PSA 05/2014 2.92 --ED --Hematuria, CT, cystoscopy 2009 negative --Prostatitis, admitted 03-2020 Vitamin D deficiency  MSK: -- Spinal stenosis : saw neurology w/ sx of paresthesias feet-ankle, sx felt to be d/t spinal stenosis not neuropathy, see OV note 08-07-2012 , used to see pain med (Dr Jordan Likes) -High Risk UDS 2016  Diastasis recti  PLAN BMP AST ALT FLP CBC A1c  Hyperglycemia: Check A1c. HTN: No ambulatory BPs, encouraged to start checking, continue lisinopril, metoprolol.  Checking a BMP and CBC. High cholesterol: On Zetia, Crestor.  Check an AST ALT FLP. Anxiety depression insomnia: Reports he is feeling well, continue Lexapro, Wellbutrin.  PDMP reviewed, RF sent for Ambien. BPH, chronic LUTS: Encouraged to see urology. Preventive care: -Tdap 2014 -  pnm 23:  2014  and 2022 ; prevnar-- 2015 - zostavax  2014  ;  shingrix x1, booster rec  - s/p flu shot and covid vax: ~ 2-3 m ago per pt -  Rec: Tdap, shingrix #2 -CCS: cscope 01-2012, bx neg per old records; Cscope 11/2018, @ [facility], next 7 years -Prostate ca screening: due to see  Dr. Bea Laura has some sxs, referred  -Lung cancer screening: LDCT done, reading pending RTC 6 months      HTN: BP okay, on lisinopril, metoprolol.  Check a BMP. Hyperlipidemia: Controlled on Zetia and rosuvastatin. Morbid obesity: Has not make any serious efforts to lose weight in a while but he would like to.  BMI is 42, I think will need a lot of support, recommend to be seen  at the wellness clinic, information provided.  In the meantime recommend portion control, decrease carbohydrate intakes. COPD: Reports sometimes feels sleepy, not using a CPAP regularly.  Encouraged to reach out to pulmonary, he would like to see about different modalities of treatment. Orthopnea: See previous visit, echo was essentially normal except for borderline LVH. BPH, chronic LUTS.   Saw urology 09/02/2022 urine culture showed Enterococcus, Rx antibiotics, was rec to RTC in 4 weeks but he did not.  Encouraged to call them. Vitamin D deficiency: S/p ergocalciferol, recommend OTCs. Preventive care: Vaccine advice provided, placed order for a lung cancer screening. RTC 4 months.

## 2023-05-12 ENCOUNTER — Encounter: Payer: Self-pay | Admitting: Internal Medicine

## 2023-05-12 LAB — BASIC METABOLIC PANEL
BUN: 15 mg/dL (ref 6–23)
CO2: 28 meq/L (ref 19–32)
Calcium: 8.6 mg/dL (ref 8.4–10.5)
Chloride: 102 meq/L (ref 96–112)
Creatinine, Ser: 0.75 mg/dL (ref 0.40–1.50)
GFR: 90.12 mL/min (ref >60.00)
Glucose, Bld: 112 mg/dL — ABNORMAL HIGH (ref 70–99)
Potassium: 4 meq/L (ref 3.5–5.1)
Sodium: 140 meq/L (ref 135–145)

## 2023-05-12 LAB — LIPID PANEL
Cholesterol: 100 mg/dL (ref 0–200)
HDL: 35.6 mg/dL — ABNORMAL LOW (ref >39.00)
LDL Cholesterol: 48 mg/dL (ref 0–99)
NonHDL: 64.16
Total CHOL/HDL Ratio: 3
Triglycerides: 82 mg/dL (ref 0.0–149.0)
VLDL: 16.4 mg/dL (ref 0.0–40.0)

## 2023-05-12 LAB — ALT: ALT: 13 U/L (ref 0–53)

## 2023-05-12 LAB — AST: AST: 12 U/L (ref 0–37)

## 2023-05-12 NOTE — Assessment & Plan Note (Signed)
Hyperglycemia: Check A1c. HTN: No ambulatory BPs, encouraged to start checking, continue lisinopril, metoprolol.  Checking a BMP and CBC. High cholesterol: On Zetia, Crestor.  Check an AST ALT FLP. Anxiety depression insomnia: Reports he is feeling well, continue Lexapro, Wellbutrin.  PDMP reviewed, RF sent for Ambien. BPH, chronic LUTS: Encouraged to see urology. Preventive care reviewed RTC 6 months

## 2023-05-12 NOTE — Assessment & Plan Note (Signed)
  Preventive care reviewed: -Tdap 2014 -  pnm 23:  2014 and 2022 ; prevnar-- 2015 - zostavax  2014  ;  shingrix x1  - s/p flu shot and covid vax: ~ 2-3 m ago per pt -  Rec: Tdap, shingrix #2 -CCS: cscope 01-2012, bx neg per old records; Cscope 11/2018, @ [facility], next 7 years -Prostate ca screening: due to see  Dr. Bea Laura has some sxs, referred  -Lung cancer screening: LDCT done, reading pending

## 2023-05-19 ENCOUNTER — Other Ambulatory Visit: Payer: Self-pay | Admitting: Internal Medicine

## 2023-05-25 ENCOUNTER — Ambulatory Visit (INDEPENDENT_AMBULATORY_CARE_PROVIDER_SITE_OTHER): Payer: Medicare Other | Admitting: Medical

## 2023-05-25 VITALS — BP 146/72 | HR 100 | Resp 18 | Ht 70.0 in | Wt 285.5 lb

## 2023-05-25 DIAGNOSIS — R61 Generalized hyperhidrosis: Secondary | ICD-10-CM

## 2023-05-25 DIAGNOSIS — R5383 Other fatigue: Secondary | ICD-10-CM | POA: Diagnosis not present

## 2023-05-25 DIAGNOSIS — R350 Frequency of micturition: Secondary | ICD-10-CM | POA: Diagnosis not present

## 2023-05-25 DIAGNOSIS — N390 Urinary tract infection, site not specified: Secondary | ICD-10-CM | POA: Diagnosis not present

## 2023-05-25 DIAGNOSIS — R319 Hematuria, unspecified: Secondary | ICD-10-CM

## 2023-05-25 LAB — CBC WITH DIFFERENTIAL/PLATELET
Basophils Absolute: 0 10*3/uL (ref 0.0–0.1)
Basophils Relative: 0.2 % (ref 0.0–3.0)
Eosinophils Absolute: 0 10*3/uL (ref 0.0–0.7)
Eosinophils Relative: 0.1 % (ref 0.0–5.0)
HCT: 48.4 % (ref 39.0–52.0)
Hemoglobin: 16 g/dL (ref 13.0–17.0)
Lymphocytes Relative: 9.5 % — ABNORMAL LOW (ref 12.0–46.0)
Lymphs Abs: 1.5 10*3/uL (ref 0.7–4.0)
MCHC: 33.1 g/dL (ref 30.0–36.0)
MCV: 99.9 fL (ref 78.0–100.0)
Monocytes Absolute: 0.6 10*3/uL (ref 0.1–1.0)
Monocytes Relative: 3.9 % (ref 3.0–12.0)
Neutro Abs: 13.5 10*3/uL — ABNORMAL HIGH (ref 1.4–7.7)
Neutrophils Relative %: 86.3 % — ABNORMAL HIGH (ref 43.0–77.0)
Platelets: 211 10*3/uL (ref 150.0–400.0)
RBC: 4.85 Mil/uL (ref 4.22–5.81)
RDW: 13.7 % (ref 11.5–15.5)
WBC: 15.6 10*3/uL — ABNORMAL HIGH (ref 4.0–10.5)

## 2023-05-25 LAB — COMPREHENSIVE METABOLIC PANEL
ALT: 14 U/L (ref 0–53)
AST: 15 U/L (ref 0–37)
Albumin: 4.5 g/dL (ref 3.5–5.2)
Alkaline Phosphatase: 78 U/L (ref 39–117)
BUN: 12 mg/dL (ref 6–23)
CO2: 27 meq/L (ref 19–32)
Calcium: 9.3 mg/dL (ref 8.4–10.5)
Chloride: 97 meq/L (ref 96–112)
Creatinine, Ser: 0.83 mg/dL (ref 0.40–1.50)
GFR: 87.38 mL/min (ref >60.00)
Glucose, Bld: 132 mg/dL — ABNORMAL HIGH (ref 70–99)
Potassium: 3.6 meq/L (ref 3.5–5.1)
Sodium: 137 meq/L (ref 135–145)
Total Bilirubin: 1.8 mg/dL — ABNORMAL HIGH (ref 0.2–1.2)
Total Protein: 7.1 g/dL (ref 6.0–8.3)

## 2023-05-25 LAB — POCT URINALYSIS DIPSTICK
Glucose, UA: NEGATIVE
Ketones, UA: 80
Nitrite, UA: POSITIVE
Protein, UA: POSITIVE — AB
Spec Grav, UA: >=1.03 — AB (ref 1.010–1.025)
Urobilinogen, UA: 1 U/dL
pH, UA: 6 (ref 5.0–8.0)

## 2023-05-25 MED ORDER — CIPROFLOXACIN HCL 500 MG PO TABS: 500.0000 mg | ORAL_TABLET | Freq: Two times a day (BID) | ORAL | 0 refills | Status: DC

## 2023-05-25 MED ORDER — CEFTRIAXONE SODIUM 1 G IJ SOLR
1.0000 g | Freq: Once | INTRAMUSCULAR | Status: AC
  Administered 2023-05-25: 1 g via INTRAMUSCULAR

## 2023-05-25 NOTE — Progress Notes (Addendum)
Subjective:    Patient ID: Robert Gibbs, male    DOB: 12-24-1950, 72 y.o.   MRN: [id number]  HPI Discussed the use of AI scribe software for clinical note transcription with the patient, who gave verbal consent to proceed.  History of Present Illness   The patient, with a history of benign prostatic hypertrophy, presented with a three-day history of frequent urination, starting on Saturday. The urination was described as low volume and frequent, sometimes as often as every five minutes. The patient reported difficulty initiating the flow and noted the presence of blood in the urine. Accompanying these symptoms was a significant decrease in fluid intake, with almost no intake on Saturday, and a reduced appetite, with no food intake since Friday night. The patient also reported feeling fatigued and experiencing constant sweating, both during the day and at night.  The patient had a previous episode of frequent urination in the spring, for which he saw a urologist. At that time, a urine culture showed the presence of bacteria, and the patient was prescribed cipro twice a day for seven days. The patient reported that the current symptoms, including the smell of the urine, were similar to the previous episode. The patient was also prescribed Gemtesa for overactive bladder, but reported that it did not help. The patient's last PSA value, from December of the previous year, was 9.94. The patient is scheduled for a follow-up appointment with the urologist in two days.          Review of Systems  Constitutional:  Positive for diaphoresis and fatigue. Negative for chills and fever.  HENT:  Negative for congestion.   Respiratory:  Negative for cough, chest tightness, shortness of breath and wheezing.   Cardiovascular:  Negative for chest pain and palpitations.  Gastrointestinal:  Negative for abdominal pain.  Genitourinary:  Positive for difficulty urinating and frequency.  Musculoskeletal:   Negative for back pain and myalgias.  Skin:  Negative for rash.  Neurological:  Negative for dizziness, speech difficulty and numbness.  Hematological:  Negative for adenopathy. Does not bruise/bleed easily.  Psychiatric/Behavioral:  Negative for confusion and decreased concentration. The patient is not nervous/anxious.    Past Medical History:  Diagnosis Date   Anxiety and depression    Arthritis    Barrett esophagus    BPH (benign prostatic hyperplasia)    CTS (carpal tunnel syndrome)    h/o   Edema of both legs    legs , ankles and feet   Erectile dysfunction    GERD (gastroesophageal reflux disease) 03/18/2012   H/O hiatal hernia    HTN (hypertension)    Hyperlipidemia    Microscopic hematuria    Neuropathy    OSA (obstructive sleep apnea) 03/18/2012   on BiPap   Prostatitis    PVD (peripheral vascular disease) (HCC)    Spinal stenosis 06-2012   saw neuro w/ "neuropathy", dx w/ spinal stenosis   Varicose veins    Vitamin D deficiency    Wears glasses      Social History   Socioeconomic History   Marital status: Divorced    Spouse name: Not on file   Number of children: 1   Years of education: Not on file   Highest education level: Bachelor's degree (e.g., BA, AB, BS)  Occupational History   Occupation: retired   Tobacco Use   Smoking status: Former    Current packs/day: 0.00    Average packs/day: 1 pack/day for 40.0 years (40.0 ttl pk-yrs)  Types: Cigarettes    Start date: 32    Quit date: 06/29/2008    Years since quitting: 14.9   Smokeless tobacco: Never   Tobacco comments:    quit 2010  Substance and Sexual Activity   Alcohol use: Yes    Alcohol/week: 3.0 standard drinks of alcohol    Types: 3 Standard drinks or equivalent per week    Comment: 2-3 large drinks, Vodka, 1-2 times a week   Drug use: No   Sexual activity: Yes  Other Topics Concern   Not on file  Social History Narrative   Lives w/  fiance of > 20 years    caffeine- 2-3  c daily        Social Determinants of Health   Financial Resource Strain: Low Risk  (07/30/2021)   Overall Financial Resource Strain (CARDIA)    Difficulty of Paying Living Expenses: Not hard at all  Food Insecurity: No Food Insecurity (08/04/2022)   Hunger Vital Sign    Worried About Running Out of Food in the Last Year: Never true    Ran Out of Food in the Last Year: Never true  Transportation Needs: No Transportation Needs (08/04/2022)   PRAPARE - Administrator, Civil Service (Medical): No    Lack of Transportation (Non-Medical): No  Physical Activity: Inactive (07/30/2021)   Exercise Vital Sign    Days of Exercise per Week: 0 days    Minutes of Exercise per Session: 0 min  Stress: Stress Concern Present (07/30/2021)   [employer] of Occupational Health - Occupational Stress Questionnaire    Feeling of Stress : To some extent  Social Connections: Socially Isolated (07/30/2021)   Social Connection and Isolation Panel [NHANES]    Frequency of Communication with Friends and Family: Once a week    Frequency of Social Gatherings with Friends and Family: Once a week    Attends Religious Services: Never    Database administrator or Organizations: No    Attends Banker Meetings: Never    Marital Status: Divorced  Catering manager Violence: Not At Risk (07/30/2021)   Humiliation, Afraid, Rape, and Kick questionnaire    Fear of Current or Ex-Partner: No    Emotionally Abused: No    Physically Abused: No    Sexually Abused: No    Past Surgical History:  Procedure Laterality Date   ARCH BAR REMOVAL     BACK SURGERY  2002   L4-L5   ELBOW SURGERY  1992   for tennis elbow-rt   ESOPHAGOGASTRODUODENOSCOPY  04/2015   w/ biopsy, short tongue of Barrett's without obvious nodularity, mass or esophagitis seen   EYE SURGERY Right    cataract with lens implant   FOOT SURGERY     R, for plantar fasciitis   HERNIA REPAIR     x 2 as a child-ing    KNEE ARTHROSCOPY WITH MENISCAL  REPAIR Bilateral 05/04/2013   Procedure: BILATERAL KNEE ARTHROSCOPY WITH POSSIBLE CHONDROPLASTY AND MENISCECTOMY ;  Surgeon: Nestor Lewandowsky, MD;  Location: [facility];  Service: Orthopedics;  Laterality: Bilateral;  Right chrondroplasty, debridement, , partial medial menisectomy, removal of loose bodies.    LIGATION / DIVISION SAPHENOUS VEIN     both legs   PROSTATE BIOPSY     2010, (-)   TONSILLECTOMY     TOTAL KNEE ARTHROPLASTY Right 11/07/2013   Procedure: TOTAL KNEE ARTHROPLASTY;  Surgeon: Nestor Lewandowsky, MD;  Location: [facility];  Service: Orthopedics;  Laterality: Right;   TOTAL KNEE ARTHROPLASTY Left 03/01/2014   Procedure: TOTAL KNEE ARTHROPLASTY;  Surgeon: Nestor Lewandowsky, MD;  Location: [facility];  Service: Orthopedics;  Laterality: Left;   UPPER GASTROINTESTINAL ENDOSCOPY     UVULOPALATOPHARYNGOPLASTY (UPPP)/TONSILLECTOMY/SEPTOPLASTY  1990    Family History  Problem Relation Age of Onset   CAD Father        ?   Alzheimer's disease Mother    Stroke Brother    Colon cancer Neg Hx    Prostate cancer Neg Hx    Diabetes Neg Hx    Cancer Neg Hx     Allergies  Allergen Reactions   Codeine Itching   Nsaids     Acid Reflux   Simvastatin     Mimics heart attack   Tolmetin     Acid Reflux    Current Outpatient Medications on File Prior to Visit  Medication Sig Dispense Refill   albuterol (VENTOLIN HFA) 108 (90 Base) MCG/ACT inhaler Inhale 2 puffs into the lungs every 6 (six) hours as needed for wheezing or shortness of breath. 18 g 5   Ascorbic Acid (VITAMIN C) 1000 MG tablet Take 1,000 mg by mouth daily.     aspirin 81 MG tablet Take 81 mg by mouth daily.     buPROPion (WELLBUTRIN XL) 300 MG 24 hr tablet Take 1 tablet (300 mg total) by mouth daily. 90 tablet 0   Calcium Carbonate-Vit D-Min (CALCIUM 1200) 1200-1000 MG-UNIT CHEW Chew 1 tablet by mouth daily.      Coenzyme Q10 200 MG capsule Take 200 mg by mouth daily.     escitalopram (LEXAPRO) 20 MG tablet TAKE 1  TABLET(20 MG) BY MOUTH DAILY 90 tablet 1   ezetimibe (ZETIA) 10 MG tablet TAKE 1 TABLET(10 MG) BY MOUTH DAILY 90 tablet 1   gabapentin (NEURONTIN) 800 MG tablet Take 1 tablet (800 mg total) by mouth 2 (two) times daily. 180 tablet 1   lisinopril (ZESTRIL) 40 MG tablet TAKE 1 TABLET(40 MG) BY MOUTH DAILY 90 tablet 1   magnesium oxide (MAG-OX) 400 MG tablet Take 400 mg by mouth daily.     Melatonin 5 MG CAPS Take 10 mg by mouth at bedtime. Patient stated that he is taking 10 mg daily     metoprolol succinate (TOPROL-XL) 100 MG 24 hr tablet Take 2 tablets (200 mg total) by mouth daily. Take with or immediately following a meal 180 tablet 1   Multiple Vitamins-Minerals (MULTIVITAMIN WITH MINERALS) tablet Take 1 tablet by mouth daily.     Omega-3 Fat Ac-Cholecalciferol (FLEX OMEGA BENEFITS/VIT D-3 PO) Take 2 capsules by mouth daily.     omeprazole (PRILOSEC) 40 MG capsule TAKE 1 CAPSULE(40 MG) BY MOUTH DAILY BEFORE BREAKFAST 90 capsule 1   rosuvastatin (CRESTOR) 10 MG tablet Take 1 tablet (10 mg total) by mouth at bedtime. 90 tablet 1   triamcinolone (NASACORT) 55 MCG/ACT AERO nasal inhaler Place 2 sprays into the nose at bedtime.     zolpidem (AMBIEN) 10 MG tablet Take 0.5-1 tablets (5-10 mg total) by mouth at bedtime as needed for sleep. 30 tablet 1   No current facility-administered medications on file prior to visit.    BP (!) 146/72   Pulse 100   Resp 18   Ht 5\' 10"  (1.778 m)   Wt 285 lb 8 oz (129.5 kg)   SpO2 95%   BMI 40.96 kg/m        Objective:   Physical Exam  General  Mental Status- Alert. General Appearance- Not in acute distress. No sweating during office visit.   Skin General: Color- Normal Color. Moisture- Normal Moisture.  Neck No JVD.  Chest and Lung Exam Auscultation: Breath Sounds: CTA  Cardiovascular Auscultation:Rythm- RRR Murmurs & Other Heart Sounds:Auscultation of the heart reveals- No Murmurs.  Abdomen Inspection:-Inspeection  Normal. Palpation/Percussion:Note:No mass. Palpation and Percussion of the abdomen reveal- Non Tender, Non Distended + BS, no rebound or guarding.    Neurologic Cranial Nerve exam:- CN III-XII intact(No nystagmus), symmetric smile. Strength:- 5/5 equal and symmetric strength both upper and lower extremities.       Assessment & Plan:   Assessment and Plan    Urinary Tract Infection (UTI) vs prostatitis Frequent urination with low volumes, difficulty initiating flow, and hematuria since 05/23/2023. Patient also reports fatigue, anorexia, and excessive sweating. Urinalysis shows concentrated urine, positive nitrites, moderate leukocytes, protein, ketones, and large amount of blood. -Administer Rocephin injection today. 1 gram IM -Order urine culture. -Order CBC and metabolic panel to assess for infection and dehydration. -Plan to prescribe Cipro pending lab results. -Advise patient to hydrate well, recommending [employer] water or sugar-free Gatorade.   Benign Prostatic Hyperplasia (BPH) History of BPH with recent symptoms of frequent urination. Patient has a follow-up appointment with Dr. Pete Glatter on 05/27/2023. -Communicate lab results to Dr. Pete Glatter and Dr. Drue Novel. -appointment with Dr. Pete Glatter already in 2 days.  General Health Maintenance -Continue Gemtesa for overactive bladder. -Check PSA today (last 9.94 in December 2023).   If abnormal lab values/emergency values may advise ED evaluation. -need to stay hydrated and make sure able to urinate. If hydrating but having urinary obstruction signs/symptoms prior to Dr. Pete Glatter visit then be seen in ED   Follow up 2 days urologist or sooner if needed         Time spent with patient today was 40  minutes which consisted of chart revdiew, discussing diagnosis, work up treatment and documentation.   [phone]

## 2023-05-25 NOTE — Patient Instructions (Addendum)
Urinary Tract Infection (UTI) vs prostatitis Frequent urination with low volumes, difficulty initiating flow, and hematuria since 05/23/2023. Patient also reports fatigue, anorexia, and excessive sweating at night. Urinalysis shows concentrated urine, positive nitrites, moderate leukocytes, protein, ketones, and large amount of blood. -Administer Rocephin injection today. 1 gram IM -Order urine culture. -Order CBC and metabolic panel to assess for infection and dehydration. -Plan to prescribe Cipro pending lab results. -Advise patient to hydrate well, recommending [employer] water or sugar-free Gatorade.   Benign Prostatic Hyperplasia (BPH) History of BPH with recent symptoms of frequent urination. Patient has a follow-up appointment with Dr. Pete Glatter on 05/27/2023. -Communicate lab results to Dr. Pete Glatter and Dr. Drue Novel. -appointment with Dr. Pete Glatter already in 2 days.  General Health Maintenance -Continue Gemtesa for overactive bladder. -Check PSA today (last 9.94 in December 2023).   If abnormal lab values/emergency values may advise ED evaluation. -need to stay hydrated and make sure able to urinate. If hydrating but having urinary obstruction signs/symptoms prior to Dr. Pete Glatter visit then be seen in ED   Follow up 2 days urologist or sooner if needed

## 2023-05-26 ENCOUNTER — Telehealth: Payer: Self-pay

## 2023-05-26 ENCOUNTER — Telehealth: Payer: Self-pay | Admitting: Medical

## 2023-05-26 LAB — PSA: PSA: 35.57 ng/mL — ABNORMAL HIGH (ref 0.10–4.00)

## 2023-05-26 NOTE — Telephone Encounter (Signed)
I was never able to get thru when I called him. He did not answer and his message box was full. Pt told me in office he has trouble getting on my chart.

## 2023-05-26 NOTE — Telephone Encounter (Signed)
Robert Gibbs called pt yesterday , calling pt today to go over labs

## 2023-05-26 NOTE — Telephone Encounter (Signed)
Labs reviewed with pt and he has picked up his medication and is going to his appt with urology tomorrow

## 2023-05-26 NOTE — Telephone Encounter (Signed)
Caller Name Rishav Maye Caller Phone Number [phone] Call Type Message Only Information Provided Reason for Call Returning a Call from the Office Initial Comment Caller is returning call from office. Additional Comment hrs Disp. Time Disposition Final User 05/25/2023 7:58:42 PM General Information Provided Yes Pearletha Furl Call Closed By: Pearletha Furl Transaction Date/Time: 05/25/2023 7:57:23 PM (ET)

## 2023-05-27 ENCOUNTER — Other Ambulatory Visit: Payer: Self-pay

## 2023-05-27 ENCOUNTER — Ambulatory Visit (INDEPENDENT_AMBULATORY_CARE_PROVIDER_SITE_OTHER): Payer: Medicare Other | Admitting: Urology

## 2023-05-27 ENCOUNTER — Encounter: Payer: Self-pay | Admitting: Urology

## 2023-05-27 VITALS — BP 153/88 | HR 118 | Ht 70.0 in | Wt 285.0 lb

## 2023-05-27 DIAGNOSIS — Z87898 Personal history of other specified conditions: Secondary | ICD-10-CM

## 2023-05-27 DIAGNOSIS — Z122 Encounter for screening for malignant neoplasm of respiratory organs: Secondary | ICD-10-CM

## 2023-05-27 DIAGNOSIS — Z87440 Personal history of urinary (tract) infections: Secondary | ICD-10-CM | POA: Diagnosis not present

## 2023-05-27 DIAGNOSIS — R972 Elevated prostate specific antigen [PSA]: Secondary | ICD-10-CM

## 2023-05-27 DIAGNOSIS — N390 Urinary tract infection, site not specified: Secondary | ICD-10-CM

## 2023-05-27 DIAGNOSIS — N401 Enlarged prostate with lower urinary tract symptoms: Secondary | ICD-10-CM | POA: Diagnosis not present

## 2023-05-27 DIAGNOSIS — N138 Other obstructive and reflux uropathy: Secondary | ICD-10-CM | POA: Diagnosis not present

## 2023-05-27 DIAGNOSIS — R3129 Other microscopic hematuria: Secondary | ICD-10-CM | POA: Diagnosis not present

## 2023-05-27 DIAGNOSIS — Z87891 Personal history of nicotine dependence: Secondary | ICD-10-CM

## 2023-05-27 LAB — URINALYSIS, ROUTINE W REFLEX MICROSCOPIC
Bilirubin, UA: NEGATIVE
Glucose, UA: NEGATIVE
Nitrite, UA: NEGATIVE
Specific Gravity, UA: >1.03 — ABNORMAL HIGH (ref 1.005–1.030)
Urobilinogen, Ur: 0.2 mg/dL (ref 0.2–1.0)
pH, UA: 5.5 (ref 5.0–7.5)

## 2023-05-27 LAB — MICROSCOPIC EXAMINATION

## 2023-05-27 LAB — URINE CULTURE
MICRO NUMBER:: 15796587
SPECIMEN QUALITY:: ADEQUATE

## 2023-05-27 MED ORDER — GEMTESA 75 MG PO TABS: 75.0000 mg | ORAL_TABLET | Freq: Every day | ORAL | Status: DC

## 2023-05-27 NOTE — Progress Notes (Signed)
Assessment: 1. BPH with obstruction/lower urinary tract symptoms   2. Frequent UTI   3. Microscopic hematuria; prior evaluation 2009   4. Elevated PSA; negatvie biopsy 2009     Plan: I personally reviewed the patient's chart including provider notes, and lab results. Complete Cipro as prescribed for UTI. Repeat trial of Gemtesa 75 mg daily.  Samples provided. Will need repeat PSA after confirmation of a negative urine culture. Return to office in 4 weeks for possible cystoscopy  Chief Complaint:  Chief Complaint  Patient presents with   Benign Prostatic Hypertrophy    History of Present Illness:  Robert Gibbs is a 72 y.o. male who is seen for continued evaluation of BPH with LUTS, microscopic hematuria, and elevated PSA. He was previously followed at [facility] in [facility] and was last seen there by me in 12/21. He has had UTI's. Urine culture results: 2/22 50-100K Enterococcus 7/23 10K Enterococcus 12/23 >100K Enterococcus  At his visit in March 2024, he continued to have urinary frequency, urgency, and nocturia 3-4 times.  He also had associated urge incontinence.  No dysuria or gross hematuria.  He reported voiding with a good stream.  He was not taking any medications for his urinary symptoms.  He does not remember if he took the Myrbetriq and if it improved his symptoms. IPSS = 26.  PVR = 37 mL Urine culture from 09/05/2022 grew 10-20 5K Enterococcus.  Treated with Cipro x 7 days. He was also given a trial of Gemtesa.  He does not remember if this improved his symptoms. He did not return for follow-up as scheduled.  He was recently seen by his PCP for increased frequency, hesitancy, and gross hematuria. Urine culture grew >100 K E. coli.  He was given an injection of Rocephin and started on Cipro. PSA drawn at the time of UTI was 35.57. He currently reports symptoms of frequency, urgency, incontinence, and gross hematuria.  No fevers or chills.  No  flank pain.  Urologic History:  He has a history of elevated PSA. He had a previous negative prostate biopsy in June 2009.  PSA results: 6/09 4.7 4/13 2.36 10/15 2.92 12/16 3.63 12/16 40.33 5/19 25.47 2/22 7.54 12/23 9.95   He has a history of LUTS. He has tried various alpha blockers which did not help and were too expensive including Flomax, Rapaflo, and Uroxatral. Daily Cialis 5 mg worked the best for his LUTS but insurance would not approve this. He tried alfuzosin which did not improve his symptoms. He reported weak FOS, frequency, urgency, incomplete emptying, and an increase in his frequency and urgency. He had intermittent episodes of urge incontinence.  He resumed Cialis 5 mg daily after his visit in 12/16 and his LUTS improved. He was seen by Dr. Drue Novel for dysuria, urgency, and strong urine odor. He was diagnosed with prostatitis and UTI. Urine culture grew E. Coli.  PSA was 40. He was initially treated with Bactrim x 4 weeks, then changed to Cipro x 2 weeks.  He was diagnosed with prostatitis and UTI in 5/19. Urine culture grew E. Coli. His symptoms resolved with antibiotics.  He was not taking daily tadalafil. AUA score = 31. He was diagnosed with prostatitis and treated with Cipro for 3 weeks. He noted some slight improvement in his symptoms. He continued to have urinary frequency, urgency, nocturia x 5, sensation of incomplete emptying and post void dribbling. No dysuria or gross hematuria. AUA score = 30.  He  underwent microscopic hematuria evaluation with CT and cysto in May 2009. He was found to have a right renal cyst. Hematuria profile from 12/16 was negative for malignancy.   At his last visit in 12/21, he noted worsening of his urinary symptoms within the prior 1-2 months. He was admitted to the hospital in October 2021 with sepsis. He was also diagnosed with prostatitis. Urinalysis showed 57 RBCs, 10 WBCs. Urine culture grew <10K colonies. He reported increased frequency,  urgency, nocturia x4, and urge incontinence. His symptoms persisted following treatment. No dysuria or gross hematuria. He continued to have frequency, nocturia x4, urgency, and urge incontinence. AUA score = 31. He was not on any medical therapy for his urinary symptoms at that time. He was given a trial of Myrbetriq 50 mg daily.  Portions of the above documentation were copied from a prior visit for review purposes only.    Past Medical History:  Past Medical History:  Diagnosis Date   Anxiety and depression    Arthritis    Barrett esophagus    BPH (benign prostatic hyperplasia)    CTS (carpal tunnel syndrome)    h/o   Edema of both legs    legs , ankles and feet   Erectile dysfunction    GERD (gastroesophageal reflux disease) 03/18/2012   H/O hiatal hernia    HTN (hypertension)    Hyperlipidemia    Microscopic hematuria    Neuropathy    OSA (obstructive sleep apnea) 03/18/2012   on BiPap   Prostatitis    PVD (peripheral vascular disease) (HCC)    Spinal stenosis 06-2012   saw neuro w/ "neuropathy", dx w/ spinal stenosis   Varicose veins    Vitamin D deficiency    Wears glasses     Past Surgical History:  Past Surgical History:  Procedure Laterality Date   ARCH BAR REMOVAL     BACK SURGERY  2002   L4-L5   ELBOW SURGERY  1992   for tennis elbow-rt   ESOPHAGOGASTRODUODENOSCOPY  04/2015   w/ biopsy, short tongue of Barrett's without obvious nodularity, mass or esophagitis seen   EYE SURGERY Right    cataract with lens implant   FOOT SURGERY     R, for plantar fasciitis   HERNIA REPAIR     x 2 as a child-ing    KNEE ARTHROSCOPY WITH MENISCAL REPAIR Bilateral 05/04/2013   Procedure: BILATERAL KNEE ARTHROSCOPY WITH POSSIBLE CHONDROPLASTY AND MENISCECTOMY ;  Surgeon: Nestor Lewandowsky, MD;  Location: [facility];  Service: Orthopedics;  Laterality: Bilateral;  Right chrondroplasty, debridement, , partial medial menisectomy, removal of loose bodies.     LIGATION / DIVISION SAPHENOUS VEIN     both legs   PROSTATE BIOPSY     2010, (-)   TONSILLECTOMY     TOTAL KNEE ARTHROPLASTY Right 11/07/2013   Procedure: TOTAL KNEE ARTHROPLASTY;  Surgeon: Nestor Lewandowsky, MD;  Location: [facility];  Service: Orthopedics;  Laterality: Right;   TOTAL KNEE ARTHROPLASTY Left 03/01/2014   Procedure: TOTAL KNEE ARTHROPLASTY;  Surgeon: Nestor Lewandowsky, MD;  Location: [facility];  Service: Orthopedics;  Laterality: Left;   UPPER GASTROINTESTINAL ENDOSCOPY     UVULOPALATOPHARYNGOPLASTY (UPPP)/TONSILLECTOMY/SEPTOPLASTY  1990    Allergies:  Allergies  Allergen Reactions   Codeine Itching   Nsaids     Acid Reflux   Simvastatin     Mimics heart attack   Tolmetin     Acid Reflux    Family History:  Family  History  Problem Relation Age of Onset   CAD Father        ?   Alzheimer's disease Mother    Stroke Brother    Colon cancer Neg Hx    Prostate cancer Neg Hx    Diabetes Neg Hx    Cancer Neg Hx     Social History:  Social History   Tobacco Use   Smoking status: Former    Current packs/day: 0.00    Average packs/day: 1 pack/day for 40.0 years (40.0 ttl pk-yrs)    Types: Cigarettes    Start date: 38    Quit date: 06/29/2008    Years since quitting: 14.9   Smokeless tobacco: Never   Tobacco comments:    quit 2010  Substance Use Topics   Alcohol use: Yes    Alcohol/week: 3.0 standard drinks of alcohol    Types: 3 Standard drinks or equivalent per week    Comment: 2-3 large drinks, Vodka, 1-2 times a week   Drug use: No    ROS: Constitutional:  Negative for fever, chills, weight loss CV: Negative for chest pain, previous MI, hypertension Respiratory:  Negative for shortness of breath, wheezing, sleep apnea, frequent cough GI:  Negative for nausea, vomiting, bloody stool, GERD  Physical exam: BP (!) 153/88   Pulse (!) 118   Ht 5\' 10"  (1.778 m)   Wt 285 lb (129.3 kg)   BMI 40.89 kg/m  GENERAL APPEARANCE:  Well appearing, well developed, well  nourished, NAD HEENT:  Atraumatic, normocephalic, oropharynx clear NECK:  Supple without lymphadenopathy or thyromegaly ABDOMEN:  Soft, non-tender, no masses EXTREMITIES:  Moves all extremities well, without clubbing, cyanosis, or edema NEUROLOGIC:  Alert and oriented x 3, normal gait, CN II-XII grossly intact MENTAL STATUS:  appropriate BACK:  Non-tender to palpation, No CVAT SKIN:  Warm, dry, and intact  Results: U/A: 11-30 WBCs, 11-30 RBCs, moderate bacteria

## 2023-06-25 ENCOUNTER — Other Ambulatory Visit: Payer: Medicare Other | Admitting: Urology

## 2023-07-09 ENCOUNTER — Other Ambulatory Visit: Payer: Medicare Other | Admitting: Urology

## 2023-07-09 NOTE — Progress Notes (Deleted)
Assessment: 1. BPH with obstruction/lower urinary tract symptoms   2. Frequent UTI   3. Microscopic hematuria; prior evaluation 2009   4. Elevated PSA; negative biopsy 2009    Plan: I personally reviewed the patient's chart including provider notes, and lab results. Complete Cipro as prescribed for UTI. Repeat trial of Gemtesa 75 mg daily.  Samples provided. Will need repeat PSA after confirmation of a negative urine culture. Return to office in 4 weeks for possible cystoscopy  Chief Complaint:  No chief complaint on file.   History of Present Illness:  Robert Gibbs is a 73 y.o. male who is seen for continued evaluation of BPH with LUTS, microscopic hematuria, and elevated PSA. He was previously followed at [facility] in [facility] and was last seen there by me in 12/21. He has had UTI's. Urine culture results: 2/22 50-100K Enterococcus 7/23 10K Enterococcus 12/23 >100K Enterococcus  At his visit in March 2024, he continued to have urinary frequency, urgency, and nocturia 3-4 times.  He also had associated urge incontinence.  No dysuria or gross hematuria.  He reported voiding with a good stream.  He was not taking any medications for his urinary symptoms.  He does not remember if he took the Myrbetriq and if it improved his symptoms. IPSS = 26.  PVR = 37 mL Urine culture from 09/05/2022 grew 10-20 5K Enterococcus.  Treated with Cipro x 7 days. He was also given a trial of Gemtesa.  He does not remember if this improved his symptoms. He did not return for follow-up as scheduled.  He was seen by his PCP for increased frequency, hesitancy, and gross hematuria. Urine culture grew >100 K E. coli.  He was given an injection of Rocephin and started on Cipro. PSA drawn at the time of UTI was 35.57. At his visit in 12/24, he reported symptoms of frequency, urgency, incontinence, and gross hematuria.  No fevers or chills.  No flank pain.  Urologic History:  He has a  history of elevated PSA. He had a previous negative prostate biopsy in June 2009.  PSA results: 6/09 4.7 4/13 2.36 10/15 2.92 12/16 3.63 12/16 40.33 5/19 25.47 2/22 7.54 12/23 9.95   He has a history of LUTS. He has tried various alpha blockers which did not help and were too expensive including Flomax, Rapaflo, and Uroxatral. Daily Cialis 5 mg worked the best for his LUTS but insurance would not approve this. He tried alfuzosin which did not improve his symptoms. He reported weak FOS, frequency, urgency, incomplete emptying, and an increase in his frequency and urgency. He had intermittent episodes of urge incontinence.  He resumed Cialis 5 mg daily after his visit in 12/16 and his LUTS improved. He was seen by Dr. Drue Novel for dysuria, urgency, and strong urine odor. He was diagnosed with prostatitis and UTI. Urine culture grew E. Coli.  PSA was 40. He was initially treated with Bactrim x 4 weeks, then changed to Cipro x 2 weeks.  He was diagnosed with prostatitis and UTI in 5/19. Urine culture grew E. Coli. His symptoms resolved with antibiotics.  He was not taking daily tadalafil. AUA score = 31. He was diagnosed with prostatitis and treated with Cipro for 3 weeks. He noted some slight improvement in his symptoms. He continued to have urinary frequency, urgency, nocturia x 5, sensation of incomplete emptying and post void dribbling. No dysuria or gross hematuria. AUA score = 30.  He underwent microscopic hematuria evaluation with  CT and cysto in May 2009. He was found to have a right renal cyst. Hematuria profile from 12/16 was negative for malignancy.   At his last visit in 12/21, he noted worsening of his urinary symptoms within the prior 1-2 months. He was admitted to the hospital in October 2021 with sepsis. He was also diagnosed with prostatitis. Urinalysis showed 57 RBCs, 10 WBCs. Urine culture grew <10K colonies. He reported increased frequency, urgency, nocturia x4, and urge incontinence.  His symptoms persisted following treatment. No dysuria or gross hematuria. He continued to have frequency, nocturia x4, urgency, and urge incontinence. AUA score = 31. He was not on any medical therapy for his urinary symptoms at that time. He was given a trial of Myrbetriq 50 mg daily.  Portions of the above documentation were copied from a prior visit for review purposes only.    Past Medical History:  Past Medical History:  Diagnosis Date   Anxiety and depression    Arthritis    Barrett esophagus    BPH (benign prostatic hyperplasia)    CTS (carpal tunnel syndrome)    h/o   Edema of both legs    legs , ankles and feet   Erectile dysfunction    GERD (gastroesophageal reflux disease) 03/18/2012   H/O hiatal hernia    HTN (hypertension)    Hyperlipidemia    Microscopic hematuria    Neuropathy    OSA (obstructive sleep apnea) 03/18/2012   on BiPap   Prostatitis    PVD (peripheral vascular disease) (HCC)    Spinal stenosis 06-2012   saw neuro w/ "neuropathy", dx w/ spinal stenosis   Varicose veins    Vitamin D deficiency    Wears glasses     Past Surgical History:  Past Surgical History:  Procedure Laterality Date   ARCH BAR REMOVAL     BACK SURGERY  2002   L4-L5   ELBOW SURGERY  1992   for tennis elbow-rt   ESOPHAGOGASTRODUODENOSCOPY  04/2015   w/ biopsy, short tongue of Barrett's without obvious nodularity, mass or esophagitis seen   EYE SURGERY Right    cataract with lens implant   FOOT SURGERY     R, for plantar fasciitis   HERNIA REPAIR     x 2 as a child-ing    KNEE ARTHROSCOPY WITH MENISCAL REPAIR Bilateral 05/04/2013   Procedure: BILATERAL KNEE ARTHROSCOPY WITH POSSIBLE CHONDROPLASTY AND MENISCECTOMY ;  Surgeon: Nestor Lewandowsky, MD;  Location: [facility];  Service: Orthopedics;  Laterality: Bilateral;  Right chrondroplasty, debridement, , partial medial menisectomy, removal of loose bodies.    LIGATION / DIVISION SAPHENOUS VEIN     both legs    PROSTATE BIOPSY     2010, (-)   TONSILLECTOMY     TOTAL KNEE ARTHROPLASTY Right 11/07/2013   Procedure: TOTAL KNEE ARTHROPLASTY;  Surgeon: Nestor Lewandowsky, MD;  Location: [facility];  Service: Orthopedics;  Laterality: Right;   TOTAL KNEE ARTHROPLASTY Left 03/01/2014   Procedure: TOTAL KNEE ARTHROPLASTY;  Surgeon: Nestor Lewandowsky, MD;  Location: [facility];  Service: Orthopedics;  Laterality: Left;   UPPER GASTROINTESTINAL ENDOSCOPY     UVULOPALATOPHARYNGOPLASTY (UPPP)/TONSILLECTOMY/SEPTOPLASTY  1990    Allergies:  Allergies  Allergen Reactions   Codeine Itching   Nsaids     Acid Reflux   Simvastatin     Mimics heart attack   Tolmetin     Acid Reflux    Family History:  Family History  Problem Relation Age  of Onset   CAD Father        ?   Alzheimer's disease Mother    Stroke Brother    Colon cancer Neg Hx    Prostate cancer Neg Hx    Diabetes Neg Hx    Cancer Neg Hx     Social History:  Social History   Tobacco Use   Smoking status: Former    Current packs/day: 0.00    Average packs/day: 1 pack/day for 40.0 years (40.0 ttl pk-yrs)    Types: Cigarettes    Start date: 44    Quit date: 06/29/2008    Years since quitting: 15.0   Smokeless tobacco: Never   Tobacco comments:    quit 2010  Substance Use Topics   Alcohol use: Yes    Alcohol/week: 3.0 standard drinks of alcohol    Types: 3 Standard drinks or equivalent per week    Comment: 2-3 large drinks, Vodka, 1-2 times a week   Drug use: No    ROS: Constitutional:  Negative for fever, chills, weight loss CV: Negative for chest pain, previous MI, hypertension Respiratory:  Negative for shortness of breath, wheezing, sleep apnea, frequent cough GI:  Negative for nausea, vomiting, bloody stool, GERD  Physical exam: There were no vitals taken for this visit. GENERAL APPEARANCE:  Well appearing, well developed, well nourished, NAD HEENT:  Atraumatic, normocephalic, oropharynx clear NECK:  Supple without lymphadenopathy or  thyromegaly ABDOMEN:  Soft, non-tender, no masses EXTREMITIES:  Moves all extremities well, without clubbing, cyanosis, or edema NEUROLOGIC:  Alert and oriented x 3, normal gait, CN II-XII grossly intact MENTAL STATUS:  appropriate BACK:  Non-tender to palpation, No CVAT SKIN:  Warm, dry, and intact  Results: U/A:   Procedure:  Flexible Cystourethroscopy  Pre-operative Diagnosis: {cysto diagnosis:26394}  Post-operative Diagnosis: {cysto diagnosis:26394}  Anesthesia:  local with lidocaine jelly  Surgical Narrative:  After appropriate informed consent was obtained, the patient was prepped and draped in the usual sterile fashion in the supine position.  The patient was correctly identified and the proper procedure delineated prior to proceeding.  Sterile lidocaine gel was instilled in the urethra. The flexible cystoscope was introduced without difficulty.  Findings:  Anterior urethra: {anterior urethral findings:26395}  Posterior urethra: {post urethral findings:26396}  Bladder: {bladder findings:26397}  Ureteral orifices: {Normal/Abnormal Appearance:21344::"normal"}  Additional findings:  Saline bladder wash for cytology {WAS/WAS NOT:[id number]::"was not"} performed.    The cystoscope was then removed.  The patient tolerated the procedure well.

## 2023-07-15 ENCOUNTER — Other Ambulatory Visit: Payer: Self-pay | Admitting: Internal Medicine

## 2023-08-03 ENCOUNTER — Telehealth: Payer: Self-pay

## 2023-08-03 NOTE — Telephone Encounter (Signed)
 Copied from CRM [id number]. Topic: General - Other >> Aug 03, 2023  4:23 PM Adonis Hoot wrote: Reason for CRM: patient is interested in receiving a scooter due to him having a hard time walking long distances with his neuropathy,total knee replacements.He would like to know if Dr Neomi Banks knows if medicare covers scooters.

## 2023-08-03 NOTE — Telephone Encounter (Signed)
 Spoke w/ Pt- he will need to speak w/ Medicare regarding requirments for scooters. We will then need an in person office visit for Medicare documentation. Pt verbalized understanding.

## 2023-09-29 ENCOUNTER — Telehealth: Payer: Self-pay | Admitting: Internal Medicine

## 2023-09-29 NOTE — Telephone Encounter (Signed)
 Copied from CRM [id number]. Topic: Medicare AWV >> Sep 29, 2023 10:54 AM Payton Doughty wrote: Reason for CRM: Called LVM 09/29/2023 to schedule AWV. Please schedule Virtual or Telehealth visits ONLY.   Verlee Rossetti; Care Guide Ambulatory Clinical Support [place] [facility] Direct Dial: [phone]

## 2023-10-05 ENCOUNTER — Ambulatory Visit (INDEPENDENT_AMBULATORY_CARE_PROVIDER_SITE_OTHER): Admitting: *Deleted

## 2023-10-05 DIAGNOSIS — Z0000 Encounter for general adult medical examination without abnormal findings: Secondary | ICD-10-CM

## 2023-10-05 NOTE — Progress Notes (Signed)
 Subjective:   Robert Gibbs is a 73 y.o. male who presents for Medicare Annual/Subsequent preventive examination.  Visit Complete: Virtual I connected with  [name] on 10/05/23 by a audio enabled telemedicine application and verified that I am speaking with the correct person using two identifiers.  Patient Location: Home  Provider Location: Office/Clinic  I discussed the limitations of evaluation and management by telemedicine. The patient expressed understanding and agreed to proceed.  Vital Signs: Because this visit was a virtual/telehealth visit, some criteria may be missing or patient reported. Any vitals not documented were not able to be obtained and vitals that have been documented are patient reported.  Cardiac Risk Factors include: advanced age (>53men, >57 women);male gender;dyslipidemia;hypertension;obesity (BMI >30kg/m2)     Objective:    There were no vitals filed for this visit. There is no height or weight on file to calculate BMI.     10/05/2023    4:01 PM 08/04/2022    1:41 PM 07/30/2021    3:21 PM 08/08/2020    2:44 PM 07/28/2015   12:40 PM 03/08/2014    5:02 PM 03/02/2014   10:13 AM  Advanced Directives  Does Patient Have a Medical Advance Directive? No No No No No No No  Would patient like information on creating a medical advance directive? No - Patient declined No - Patient declined Yes (MAU/Ambulatory/Procedural Areas - Information given) No - Patient declined   No - patient declined information    Current Medications (verified) Outpatient Encounter Medications as of 10/05/2023  Medication Sig   albuterol (VENTOLIN HFA) 108 (90 Base) MCG/ACT inhaler Inhale 2 puffs into the lungs every 6 (six) hours as needed for wheezing or shortness of breath.   Ascorbic Acid (VITAMIN C) 1000 MG tablet Take 1,000 mg by mouth daily.   aspirin 81 MG tablet Take 81 mg by mouth daily.   buPROPion (WELLBUTRIN XL) 300 MG 24 hr tablet Take 1 tablet (300 mg total) by  mouth daily.   Calcium Carbonate-Vit D-Min (CALCIUM 1200) 1200-1000 MG-UNIT CHEW Chew 1 tablet by mouth daily.    Coenzyme Q10 200 MG capsule Take 200 mg by mouth daily.   escitalopram (LEXAPRO) 20 MG tablet TAKE 1 TABLET(20 MG) BY MOUTH DAILY   ezetimibe (ZETIA) 10 MG tablet TAKE 1 TABLET(10 MG) BY MOUTH DAILY   gabapentin (NEURONTIN) 800 MG tablet Take 1 tablet (800 mg total) by mouth 2 (two) times daily.   lisinopril (ZESTRIL) 40 MG tablet TAKE 1 TABLET(40 MG) BY MOUTH DAILY   magnesium oxide (MAG-OX) 400 MG tablet Take 400 mg by mouth daily.   Melatonin 5 MG CAPS Take 10 mg by mouth at bedtime. Patient stated that he is taking 10 mg daily   metoprolol succinate (TOPROL-XL) 100 MG 24 hr tablet Take 2 tablets (200 mg total) by mouth daily. Take with or immediately following a meal   Multiple Vitamins-Minerals (MULTIVITAMIN WITH MINERALS) tablet Take 1 tablet by mouth daily.   Omega-3 Fat Ac-Cholecalciferol (FLEX OMEGA BENEFITS/VIT D-3 PO) Take 2 capsules by mouth daily.   omeprazole (PRILOSEC) 40 MG capsule Take 1 capsule (40 mg total) by mouth daily before breakfast.   rosuvastatin (CRESTOR) 10 MG tablet Take 1 tablet (10 mg total) by mouth at bedtime.   triamcinolone (NASACORT) 55 MCG/ACT AERO nasal inhaler Place 2 sprays into the nose at bedtime.   Vibegron (GEMTESA) 75 MG TABS Take 1 tablet (75 mg total) by mouth daily.   zolpidem (AMBIEN) 10 MG  tablet Take 0.5-1 tablets (5-10 mg total) by mouth at bedtime as needed for sleep.   [DISCONTINUED] ciprofloxacin (CIPRO) 500 MG tablet Take 1 tablet (500 mg total) by mouth 2 (two) times daily.   No facility-administered encounter medications on file as of 10/05/2023.    Allergies (verified) Codeine, Nsaids, Simvastatin, and Tolmetin   History: Past Medical History:  Diagnosis Date   Anxiety and depression    Arthritis    Barrett esophagus    BPH (benign prostatic hyperplasia)    CTS (carpal tunnel syndrome)    h/o   Edema of both legs     legs , ankles and feet   Erectile dysfunction    GERD (gastroesophageal reflux disease) 03/18/2012   H/O hiatal hernia    HTN (hypertension)    Hyperlipidemia    Microscopic hematuria    Neuropathy    OSA (obstructive sleep apnea) 03/18/2012   on BiPap   Prostatitis    PVD (peripheral vascular disease) (HCC)    Spinal stenosis 06-2012   saw neuro w/ "neuropathy", dx w/ spinal stenosis   Varicose veins    Vitamin D deficiency    Wears glasses    Past Surgical History:  Procedure Laterality Date   ARCH BAR REMOVAL     BACK SURGERY  2002   L4-L5   ELBOW SURGERY  1992   for tennis elbow-rt   ESOPHAGOGASTRODUODENOSCOPY  04/2015   w/ biopsy, short tongue of Barrett's without obvious nodularity, mass or esophagitis seen   EYE SURGERY Right    cataract with lens implant   FOOT SURGERY     R, for plantar fasciitis   HERNIA REPAIR     x 2 as a child-ing    KNEE ARTHROSCOPY WITH MENISCAL REPAIR Bilateral 05/04/2013   Procedure: BILATERAL KNEE ARTHROSCOPY WITH POSSIBLE CHONDROPLASTY AND MENISCECTOMY ;  Surgeon: Nestor Lewandowsky, MD;  Location:  S[facility]ERY [facility];  Service: Orthopedics;  Laterality: Bilateral;  Right chrondroplasty, debridement, , partial medial menisectomy, removal of loose bodies.    LIGATION / DIVISION SAPHENOUS VEIN     both legs   PROSTATE BIOPSY     2010, (-)   TONSILLECTOMY     TOTAL KNEE ARTHROPLASTY Right 11/07/2013   Procedure: TOTAL KNEE ARTHROPLASTY;  Surgeon: Nestor Lewandowsky, MD;  Location: [facility] OR;  Service: Orthopedics;  Laterality: Right;   TOTAL KNEE ARTHROPLASTY Left 03/01/2014   Procedure: TOTAL KNEE ARTHROPLASTY;  Surgeon: Nestor Lewandowsky, MD;  Location: [facility] OR;  Service: Orthopedics;  Laterality: Left;   UPPER GASTROINTESTINAL ENDOSCOPY     UVULOPALATOPHARYNGOPLASTY (UPPP)/TONSILLECTOMY/SEPTOPLASTY  1990   Family History  Problem Relation Age of Onset   CAD Father        ?   Alzheimer's disease Mother    Stroke Brother    Colon cancer Neg Hx     Prostate cancer Neg Hx    Diabetes Neg Hx    Cancer Neg Hx    Social History   Socioeconomic History   Marital status: Divorced    Spouse name: Not on file   Number of children: 1   Years of education: Not on file   Highest education level: Bachelor's degree (e.g., BA, AB, BS)  Occupational History   Occupation: retired   Tobacco Use   Smoking status: Former    Current packs/day: 0.00    Average packs/day: 1 pack/day for 40.0 years (40.0 ttl pk-yrs)    Types: Cigarettes    Start date: 15  Quit date: 06/29/2008    Years since quitting: 15.2   Smokeless tobacco: Never   Tobacco comments:    quit 2010  Substance and Sexual Activity   Alcohol use: Yes    Alcohol/week: 3.0 standard drinks of alcohol    Types: 3 Standard drinks or equivalent per week    Comment: 2-3 large drinks, Vodka, 1-2 times a week   Drug use: No   Sexual activity: Yes  Other Topics Concern   Not on file  Social History Narrative   Lives w/  fiance of > 20 years    caffeine- 2-3  c daily       Social Drivers of Corporate investment banker Strain: Low Risk  (10/05/2023)   Overall Financial Resource Strain (CARDIA)    Difficulty of Paying Living Expenses: Not very hard  Food Insecurity: No Food Insecurity (10/05/2023)   Hunger Vital Sign    Worried About Running Out of Food in the Last Year: Never true    Ran Out of Food in the Last Year: Never true  Transportation Needs: No Transportation Needs (10/05/2023)   PRAPARE - Administrator, Civil Service (Medical): No    Lack of Transportation (Non-Medical): No  Physical Activity: Inactive (10/05/2023)   Exercise Vital Sign    Days of Exercise per Week: 0 days    Minutes of Exercise per Session: 0 min  Stress: Stress Concern Present (10/05/2023)   [employer] of Occupational Health - Occupational Stress Questionnaire    Feeling of Stress : Very much  Social Connections: Socially Isolated (10/05/2023)   Social Connection and  Isolation Panel [NHANES]    Frequency of Communication with Friends and Family: Once a week    Frequency of Social Gatherings with Friends and Family: Never    Attends Religious Services: Never    Database administrator or Organizations: No    Attends Engineer, structural: Never    Marital Status: Living with partner    Tobacco Counseling Counseling given: Not Answered Tobacco comments: quit 2010   Clinical Intake:  Pre-visit preparation completed: Yes  Pain : No/denies pain     Nutritional Risks: None Diabetes: No  How often do you need to have someone help you when you read instructions, pamphlets, or other written materials from your doctor or pharmacy?: 1 - Never  Interpreter Needed?: No      Activities of Daily Living    10/05/2023    3:53 PM  In your present state of health, do you have any difficulty performing the following activities:  Hearing? 1  Vision? 1  Difficulty concentrating or making decisions? 1  Comment short term memory over the last year  Walking or climbing stairs? 1  Dressing or bathing? 0  Doing errands, shopping? 0  Preparing Food and eating ? N  Using the Toilet? N  In the past six months, have you accidently leaked urine? Y  Do you have problems with loss of bowel control? N  Managing your Medications? N  Managing your Finances? N  Housekeeping or managing your Housekeeping? N    Patient Care Team: Ezell Hollow, MD as PCP - General (Internal Medicine) Coleen Dauer., MD as Referring Physician (Gastroenterology) Willye Harvey, Ponce Brisker., MD as Referring Physician (Urology)  Indicate any recent Medical Services you may have received from other than [facility] providers in the past year (date may be approximate).     Assessment:   This is a routine  wellness examination for [place].  Hearing/Vision screen No results found.   Goals Addressed   None    Depression Screen    10/05/2023    4:07 PM 05/11/2023    2:15 PM 08/18/2022     1:50 PM 08/04/2022    1:44 PM 07/18/2022   12:54 PM 06/17/2022    2:35 PM 11/27/2021    1:25 PM  PHQ 2/9 Scores  PHQ - 2 Score 6 3 5 5  0 0 2  PHQ- 9 Score 19 8 22 22 4  7     Fall Risk    10/05/2023    3:59 PM 05/11/2023    1:47 PM 08/18/2022    1:50 PM 08/04/2022    1:41 PM 07/18/2022    1:00 PM  Fall Risk   Falls in the past year? 1 0 0 0 0  Number falls in past yr: 1 0 0 0 0  Injury with Fall? 0 0 0 0 0  Risk for fall due to : History of fall(s);Impaired balance/gait   No Fall Risks   Follow up Falls evaluation completed Falls evaluation completed Falls evaluation completed Falls evaluation completed Falls evaluation completed    MEDICARE RISK AT HOME: Medicare Risk at Home Any stairs in or around the home?: Yes If so, are there any without handrails?: No Home free of loose throw rugs in walkways, pet beds, electrical cords, etc?: Yes Adequate lighting in your home to reduce risk of falls?: Yes Life alert?: No Use of a cane, walker or w/c?: No Grab bars in the bathroom?: No Shower chair or bench in shower?: No Elevated toilet seat or a handicapped toilet?: Yes  TIMED UP AND GO:  Was the test performed?  No    Cognitive Function:        10/05/2023    4:16 PM 08/04/2022    1:50 PM 07/30/2021    3:24 PM  6CIT Screen  What Year? 0 points 0 points 0 points  What month? 0 points 0 points 0 points  What time? 0 points 0 points 0 points  Count back from 20 0 points 0 points 0 points  Months in reverse 0 points 0 points 2 points  Repeat phrase 2 points 6 points 4 points  Total Score 2 points 6 points 6 points    Immunizations Immunization History  Administered Date(s) Administered   Fluad Quad(high Dose 65+) 04/22/2019, 05/02/2020   Influenza Split 04/27/2012   Influenza, High Dose Seasonal PF 03/21/2016, 05/29/2017, 06/02/2018, 03/12/2023   Influenza,inj,Quad PF,6+ Mos 03/24/2013, 04/18/2014, 04/24/2015   Influenza-Unspecified 03/23/2021, 04/28/2022    PFIZER(Purple Top)SARS-COV-2 Vaccination 08/15/2019, 09/05/2019, 08/01/2020   PPD Test 11/09/2013   Pfizer Covid-19 Vaccine Bivalent Booster 65yrs & up 04/18/2021, 04/28/2022   Pfizer(Comirnaty)Fall Seasonal Vaccine 12 years and older 03/12/2023   Pneumococcal Conjugate-13 04/18/2014   Pneumococcal Polysaccharide-23 03/24/2013, 05/29/2021   Tdap 11/16/2012   Zoster Recombinant(Shingrix) 05/26/2019   Zoster, Live 11/17/2012    TDAP status: Due, Education has been provided regarding the importance of this vaccine. Advised may receive this vaccine at local pharmacy or Health Dept. Aware to provide a copy of the vaccination record if obtained from local pharmacy or Health Dept. Verbalized acceptance and understanding.  Flu Vaccine status: Up to date  Pneumococcal vaccine status: Up to date  Covid-19 vaccine status: Information provided on how to obtain vaccines.   Qualifies for Shingles Vaccine? Yes   Zostavax completed Yes   Shingrix Completed?: No.    Education has  been provided regarding the importance of this vaccine. Patient has been advised to call insurance company to determine out of pocket expense if they have not yet received this vaccine. Advised may also receive vaccine at local pharmacy or Health Dept. Verbalized acceptance and understanding.  Screening Tests Health Maintenance  Topic Date Due   Zoster Vaccines- Shingrix (2 of 2) 07/21/2019   DTaP/Tdap/Td (2 - Td or Tdap) 11/17/2022   Medicare Annual Wellness (AWV)  08/05/2023   COVID-19 Vaccine (7 - 2024-25 season) 09/09/2023   INFLUENZA VACCINE  01/22/2024   Colonoscopy  12/01/2025   Pneumonia Vaccine 40+ Years old  Completed   Hepatitis C Screening  Completed   HPV VACCINES  Aged Out   Meningococcal B Vaccine  Aged Out   Lung Cancer Screening  Discontinued    Health Maintenance  Health Maintenance Due  Topic Date Due   Zoster Vaccines- Shingrix (2 of 2) 07/21/2019   DTaP/Tdap/Td (2 - Td or Tdap) 11/17/2022    Medicare Annual Wellness (AWV)  08/05/2023   COVID-19 Vaccine (7 - 2024-25 season) 09/09/2023    Colorectal cancer screening: Type of screening: Colonoscopy. Completed 12/02/18. Repeat every 7 years  Lung Cancer Screening: (Low Dose CT Chest recommended if Age 15-80 years, 20 pack-year currently smoking OR have quit w/in 15years.) does not qualify.    Additional Screening:  Hepatitis C Screening: does qualify; Completed 03/21/16 Vision Screening: Recommended annual ophthalmology exams for early detection of glaucoma and other disorders of the eye. Is the patient up to date with their annual eye exam?  Yes  Who is the provider or what is the name of the office in which the patient attends annual eye exams? Can't remember at this time If pt is not established with a provider, would they like to be referred to a provider to establish care? No .   Dental Screening: Recommended annual dental exams for proper oral hygiene  Diabetic Foot Exam: NA  Community Resource Referral / Chronic Care Management: CRR required this visit?  No   CCM required this visit?  No     Plan:     I have personally reviewed and noted the following in the patient's chart:   Medical and social history Use of alcohol, tobacco or illicit drugs  Current medications and supplements including opioid prescriptions. Patient is not currently taking opioid prescriptions. Functional ability and status Nutritional status Physical activity Advanced directives List of other physicians Hospitalizations, surgeries, and ER visits in previous 12 months Vitals Screenings to include cognitive, depression, and falls Referrals and appointments  In addition, I have reviewed and discussed with patient certain preventive protocols, quality metrics, and best practice recommendations. A written personalized care plan for preventive services as well as general preventive health recommendations were provided to patient.      Peter Brands, CMA   10/05/2023   After Visit Summary: (MyChart) Due to this being a telephonic visit, the after visit summary with patients personalized plan was offered to patient via MyChart   Nurse Notes: none

## 2023-10-05 NOTE — Patient Instructions (Signed)
 Robert Gibbs , Thank you for taking time to come for your Medicare Wellness Visit. I appreciate your ongoing commitment to your health goals. Please review the following plan we discussed and let me know if I can assist you in the future.     This is a list of the screening recommended for you and due dates:  Health Maintenance  Topic Date Due   Zoster (Shingles) Vaccine (2 of 2) 07/21/2019   DTaP/Tdap/Td vaccine (2 - Td or Tdap) 11/17/2022   COVID-19 Vaccine (7 - 2024-25 season) 09/09/2023   Flu Shot  01/22/2024   Medicare Annual Wellness Visit  10/04/2024   Colon Cancer Screening  12/01/2025   Pneumonia Vaccine  Completed   Hepatitis C Screening  Completed   HPV Vaccine  Aged Out   Meningitis B Vaccine  Aged Out   Screening for Lung Cancer  Discontinued     Next appointment: Follow up in one year for your annual wellness visit.   Preventive Care 1 Years and Older, Male Preventive care refers to lifestyle choices and visits with your health care provider that can promote health and wellness. What does preventive care include? A yearly physical exam. This is also called an annual well check. Dental exams once or twice a year. Routine eye exams. Ask your health care provider how often you should have your eyes checked. Personal lifestyle choices, including: Daily care of your teeth and gums. Regular physical activity. Eating a healthy diet. Avoiding tobacco and drug use. Limiting alcohol use. Practicing safe sex. Taking low doses of aspirin every day. Taking vitamin and mineral supplements as recommended by your health care provider. What happens during an annual well check? The services and screenings done by your health care provider during your annual well check will depend on your age, overall health, lifestyle risk factors, and family history of disease. Counseling  Your health care provider may ask you questions about your: Alcohol use. Tobacco use. Drug  use. Emotional well-being. Home and relationship well-being. Sexual activity. Eating habits. History of falls. Memory and ability to understand (cognition). Work and work Astronomer. Screening  You may have the following tests or measurements: Height, weight, and BMI. Blood pressure. Lipid and cholesterol levels. These may be checked every 5 years, or more frequently if you are over 17 years old. Skin check. Lung cancer screening. You may have this screening every year starting at age 51 if you have a 30-pack-year history of smoking and currently smoke or have quit within the past 15 years. Fecal occult blood test (FOBT) of the stool. You may have this test every year starting at age 49. Flexible sigmoidoscopy or colonoscopy. You may have a sigmoidoscopy every 5 years or a colonoscopy every 10 years starting at age 42. Prostate cancer screening. Recommendations will vary depending on your family history and other risks. Hepatitis C blood test. Hepatitis B blood test. Sexually transmitted disease (STD) testing. Diabetes screening. This is done by checking your blood sugar (glucose) after you have not eaten for a while (fasting). You may have this done every 1-3 years. Abdominal aortic aneurysm (AAA) screening. You may need this if you are a current or former smoker. Osteoporosis. You may be screened starting at age 73 if you are at high risk. Talk with your health care provider about your test results, treatment options, and if necessary, the need for more tests. Vaccines  Your health care provider may recommend certain vaccines, such as: Influenza vaccine. This is recommended  every year. Tetanus, diphtheria, and acellular pertussis (Tdap, Td) vaccine. You may need a Td booster every 10 years. Zoster vaccine. You may need this after age 41. Pneumococcal 13-valent conjugate (PCV13) vaccine. One dose is recommended after age 4. Pneumococcal polysaccharide (PPSV23) vaccine. One dose is  recommended after age 35. Talk to your health care provider about which screenings and vaccines you need and how often you need them. This information is not intended to replace advice given to you by your health care provider. Make sure you discuss any questions you have with your health care provider. Document Released: 07/06/2015 Document Revised: 02/27/2016 Document Reviewed: 04/10/2015 Elsevier Interactive Patient Education  2017 [employer].  Fall Prevention in the Home Falls can cause injuries. They can happen to people of all ages. There are many things you can do to make your home safe and to help prevent falls. What can I do on the outside of my home? Regularly fix the edges of walkways and driveways and fix any cracks. Remove anything that might make you trip as you walk through a door, such as a raised step or threshold. Trim any bushes or trees on the path to your home. Use bright outdoor lighting. Clear any walking paths of anything that might make someone trip, such as rocks or tools. Regularly check to see if handrails are loose or broken. Make sure that both sides of any steps have handrails. Any raised decks and porches should have guardrails on the edges. Have any leaves, snow, or ice cleared regularly. Use sand or salt on walking paths during winter. Clean up any spills in your garage right away. This includes oil or grease spills. What can I do in the bathroom? Use night lights. Install grab bars by the toilet and in the tub and shower. Do not use towel bars as grab bars. Use non-skid mats or decals in the tub or shower. If you need to sit down in the shower, use a plastic, non-slip stool. Keep the floor dry. Clean up any water that spills on the floor as soon as it happens. Remove soap buildup in the tub or shower regularly. Attach bath mats securely with double-sided non-slip rug tape. Do not have throw rugs and other things on the floor that can make you  trip. What can I do in the bedroom? Use night lights. Make sure that you have a light by your bed that is easy to reach. Do not use any sheets or blankets that are too big for your bed. They should not hang down onto the floor. Have a firm chair that has side arms. You can use this for support while you get dressed. Do not have throw rugs and other things on the floor that can make you trip. What can I do in the kitchen? Clean up any spills right away. Avoid walking on wet floors. Keep items that you use a lot in easy-to-reach places. If you need to reach something above you, use a strong step stool that has a grab bar. Keep electrical cords out of the way. Do not use floor polish or wax that makes floors slippery. If you must use wax, use non-skid floor wax. Do not have throw rugs and other things on the floor that can make you trip. What can I do with my stairs? Do not leave any items on the stairs. Make sure that there are handrails on both sides of the stairs and use them. Fix handrails that are broken  or loose. Make sure that handrails are as long as the stairways. Check any carpeting to make sure that it is firmly attached to the stairs. Fix any carpet that is loose or worn. Avoid having throw rugs at the top or bottom of the stairs. If you do have throw rugs, attach them to the floor with carpet tape. Make sure that you have a light switch at the top of the stairs and the bottom of the stairs. If you do not have them, ask someone to add them for you. What else can I do to help prevent falls? Wear shoes that: Do not have high heels. Have rubber bottoms. Are comfortable and fit you well. Are closed at the toe. Do not wear sandals. If you use a stepladder: Make sure that it is fully opened. Do not climb a closed stepladder. Make sure that both sides of the stepladder are locked into place. Ask someone to hold it for you, if possible. Clearly mark and make sure that you can  see: Any grab bars or handrails. First and last steps. Where the edge of each step is. Use tools that help you move around (mobility aids) if they are needed. These include: Canes. Walkers. Scooters. Crutches. Turn on the lights when you go into a dark area. Replace any light bulbs as soon as they burn out. Set up your furniture so you have a clear path. Avoid moving your furniture around. If any of your floors are uneven, fix them. If there are any pets around you, be aware of where they are. Review your medicines with your doctor. Some medicines can make you feel dizzy. This can increase your chance of falling. Ask your doctor what other things that you can do to help prevent falls. This information is not intended to replace advice given to you by your health care provider. Make sure you discuss any questions you have with your health care provider. Document Released: 04/05/2009 Document Revised: 11/15/2015 Document Reviewed: 07/14/2014 Elsevier Interactive Patient Education  2017 [employer].

## 2023-10-14 ENCOUNTER — Other Ambulatory Visit: Payer: Self-pay | Admitting: Internal Medicine

## 2023-11-09 ENCOUNTER — Encounter: Payer: Self-pay | Admitting: Internal Medicine

## 2023-11-09 ENCOUNTER — Ambulatory Visit (INDEPENDENT_AMBULATORY_CARE_PROVIDER_SITE_OTHER): Payer: Medicare Other | Admitting: Internal Medicine

## 2023-11-09 VITALS — BP 136/86 | HR 49 | Temp 97.6°F | Resp 12 | Ht 70.0 in | Wt 294.2 lb

## 2023-11-09 DIAGNOSIS — G629 Polyneuropathy, unspecified: Secondary | ICD-10-CM

## 2023-11-09 DIAGNOSIS — E559 Vitamin D deficiency, unspecified: Secondary | ICD-10-CM

## 2023-11-09 DIAGNOSIS — I10 Essential (primary) hypertension: Secondary | ICD-10-CM

## 2023-11-09 DIAGNOSIS — F32A Depression, unspecified: Secondary | ICD-10-CM

## 2023-11-09 DIAGNOSIS — Z6841 Body Mass Index (BMI) 40.0 and over, adult: Secondary | ICD-10-CM

## 2023-11-09 DIAGNOSIS — G4700 Insomnia, unspecified: Secondary | ICD-10-CM

## 2023-11-09 DIAGNOSIS — F419 Anxiety disorder, unspecified: Secondary | ICD-10-CM

## 2023-11-09 MED ORDER — NAFTIFINE HCL 2 % EX GEL: CUTANEOUS | 0 refills | Status: DC

## 2023-11-09 NOTE — Patient Instructions (Signed)
 INSTRUCTIONS  FOR TODAY  Consider increase gabapentin  to 1 tablet twice daily  Apply the cream to 30 skin lesion on the right arm.   Check the  blood pressure regularly Blood pressure goal:  between 110/65 and  135/85. If it is consistently higher or lower, let me know     GO TO THE LAB : Get the blood work     Next office visit for a checkup in 4 months Please make an appointment before you leave today

## 2023-11-09 NOTE — Progress Notes (Signed)
 Subjective:    Patient ID: Robert Gibbs, male    DOB: Apr 21, 1951, 73 y.o.   MRN: [id number]  DOS:  11/09/2023 Type of visit - description: Follow-up  Chronic medical problems addressed. No recent ambulatory BPs Sometimes he is forgetful has a difficult time coming up with words but denies feeling confused, has not been getting lost or any other major short or long-term memory issues. Anxiety depression insomnia: Well-controlled. Still have chronic lower extremity paresthesias.  Review of Systems See above   Past Medical History:  Diagnosis Date   Anxiety and depression    Arthritis    Barrett esophagus    BPH (benign prostatic hyperplasia)    CTS (carpal tunnel syndrome)    h/o   Edema of both legs    legs , ankles and feet   Erectile dysfunction    GERD (gastroesophageal reflux disease) 03/18/2012   H/O hiatal hernia    HTN (hypertension)    Hyperlipidemia    Microscopic hematuria    Neuropathy    OSA (obstructive sleep apnea) 03/18/2012   on BiPap   Prostatitis    PVD (peripheral vascular disease) (HCC)    Spinal stenosis 06-2012   saw neuro w/ "neuropathy", dx w/ spinal stenosis   Varicose veins    Vitamin D  deficiency    Wears glasses     Past Surgical History:  Procedure Laterality Date   ARCH BAR REMOVAL     BACK SURGERY  2002   L4-L5   ELBOW SURGERY  1992   for tennis elbow-rt   ESOPHAGOGASTRODUODENOSCOPY  04/2015   w/ biopsy, short tongue of Barrett's without obvious nodularity, mass or esophagitis seen   EYE SURGERY Right    cataract with lens implant   FOOT SURGERY     R, for plantar fasciitis   HERNIA REPAIR     x 2 as a child-ing    KNEE ARTHROSCOPY WITH MENISCAL REPAIR Bilateral 05/04/2013   Procedure: BILATERAL KNEE ARTHROSCOPY WITH POSSIBLE CHONDROPLASTY AND MENISCECTOMY ;  Surgeon: Ilean Mall, MD;  Location: [facility];  Service: Orthopedics;  Laterality: Bilateral;  Right chrondroplasty, debridement, , partial medial  menisectomy, removal of loose bodies.    LIGATION / DIVISION SAPHENOUS VEIN     both legs   PROSTATE BIOPSY     2010, (-)   TONSILLECTOMY     TOTAL KNEE ARTHROPLASTY Right 11/07/2013   Procedure: TOTAL KNEE ARTHROPLASTY;  Surgeon: Ilean Mall, MD;  Location: [facility] OR;  Service: Orthopedics;  Laterality: Right;   TOTAL KNEE ARTHROPLASTY Left 03/01/2014   Procedure: TOTAL KNEE ARTHROPLASTY;  Surgeon: Ilean Mall, MD;  Location: [facility] OR;  Service: Orthopedics;  Laterality: Left;   UPPER GASTROINTESTINAL ENDOSCOPY     UVULOPALATOPHARYNGOPLASTY (UPPP)/TONSILLECTOMY/SEPTOPLASTY  1990    Current Outpatient Medications  Medication Instructions   aspirin  81 mg, Daily   buPROPion  (WELLBUTRIN  XL) 300 mg, Oral, Daily   Calcium  Carbonate-Vit D-Min (CALCIUM  1200) 1200-1000 MG-UNIT CHEW 1 tablet, Daily   Coenzyme Q10 200 mg, Daily   escitalopram  (LEXAPRO ) 20 MG tablet TAKE 1 TABLET(20 MG) BY MOUTH DAILY   ezetimibe  (ZETIA ) 10 MG tablet TAKE 1 TABLET(10 MG) BY MOUTH DAILY   gabapentin  (NEURONTIN ) 800 mg, Oral, 2 times daily   Gemtesa  75 mg, Oral, Daily   lisinopril  (ZESTRIL ) 40 MG tablet TAKE 1 TABLET(40 MG) BY MOUTH DAILY   magnesium  oxide (MAG-OX) 400 mg, Daily   metoprolol  succinate (TOPROL -XL) 100 MG 24 hr tablet TAKE  2 TABLETS(200 MG) BY MOUTH DAILY WITH OR IMMEDIATELY FOLLOWING A MEAL   Multiple Vitamins-Minerals (MULTIVITAMIN WITH MINERALS) tablet 1 tablet, Daily   omeprazole  (PRILOSEC) 40 mg, Oral, Daily before breakfast   rosuvastatin  (CRESTOR ) 10 mg, Oral, Daily   vitamin C 1,000 mg, Daily   zolpidem  (AMBIEN ) 5-10 mg, Oral, At bedtime PRN       Objective:   Physical Exam BP (!) 138/90 (BP Location: Right Arm, Patient Position: Sitting, Cuff Size: Large)   Pulse (!) 49   Temp 97.6 F (36.4 C) (Oral)   Resp 12   Ht 5\' 10"  (1.778 m)   Wt 294 lb 3.2 oz (133.4 kg)   SpO2 93%   BMI 42.21 kg/m  General:   Well developed, NAD, BMI noted. HEENT:  Normocephalic . Face symmetric,  atraumatic Lungs:  CTA B Normal respiratory effort, no intercostal retractions, no accessory muscle use. Heart: RRR,  no murmur.  Lower extremities: no pretibial edema bilaterally  Skin: Near the right wrist, approximately has a normal light lesion: Borders are slightly elevated, the center is not red, it is flat. Neurologic:  alert & oriented X3.  Speech normal, gait appropriate for age and unassisted Psych--  Cognition and judgment appear intact.  Cooperative with normal attention span and concentration.  Behavior appropriate. No anxious or depressed appearing.      Assessment   Assessment Hyperglycemia HTN Hyperlipidemia: s/e  with simvastatin  Chronic lower extremity edema w/ some pretibial redness  (ER 2-17, US  neg DVT, BNP wnl) anxiety depression, insomnia COPD: Per CT 03-2016 Coronary calcifications per CTA. Morbid obesity GI: GERD, Barrett's  esophagus OSA, BiPAP, per pulmonary, not using frequently as of 07-2021  GU: Dr Willye Harvey  --LUTS , failed a # of medications --BPH, elevated PSA:  (-)  biopsy 2009  --Hematuria, CT, cystoscopy 2009 negative --Prostatitis, admitted 03-2020 Vitamin D  deficiency  MSK: -- Spinal stenosis : saw neurology w/ sx of paresthesias feet-ankle, sx felt to be d/t spinal stenosis not neuropathy, see OV note 08-07-2012 , used to see pain med (Dr Mona Angle) Diastasis recti  PLAN HTN: No recent ambulatory BPs, BP today 136/86.  Continue lisinopril , metoprolol , check the BMP and CBC Hyperlipidemia: On Crestor  and Zetia .  Controlled per last FLP. Anxiety depression insomnia: Lexapro , Wellbutrin , Ambien . Issues controlled  Vitamin D  deficiency: Checking levels. BPH: Per urology Neuropathy: Saw neurology 07/04/2020, multiple labs order, I do not believe they were completed.  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  Will get a B12, folic acid.  He decided to take only 1 gabapentin , because it was not helping much.  I still recommend to try  BID. Forgetfulness: Mild forgetfulness without major problems, recommend to stay mentally active. Skin lesion: See physical exam, trial with an antifungal, Naftin . Vaccine advice: Due for Tdap.,  Patient aware RTC 4 to 5 months

## 2023-11-10 ENCOUNTER — Other Ambulatory Visit (INDEPENDENT_AMBULATORY_CARE_PROVIDER_SITE_OTHER)

## 2023-11-10 ENCOUNTER — Telehealth: Payer: Self-pay | Admitting: Urology

## 2023-11-10 DIAGNOSIS — I10 Essential (primary) hypertension: Secondary | ICD-10-CM

## 2023-11-10 DIAGNOSIS — Z6841 Body Mass Index (BMI) 40.0 and over, adult: Secondary | ICD-10-CM

## 2023-11-10 DIAGNOSIS — G629 Polyneuropathy, unspecified: Secondary | ICD-10-CM

## 2023-11-10 LAB — CBC WITH DIFFERENTIAL/PLATELET
Basophils Absolute: 0 10*3/uL (ref 0.0–0.1)
Basophils Relative: 0.5 % (ref 0.0–3.0)
Eosinophils Absolute: 0.3 10*3/uL (ref 0.0–0.7)
Eosinophils Relative: 5 % (ref 0.0–5.0)
HCT: 43.2 % (ref 39.0–52.0)
Hemoglobin: 14.7 g/dL (ref 13.0–17.0)
Lymphocytes Relative: 35 % (ref 12.0–46.0)
Lymphs Abs: 2.1 10*3/uL (ref 0.7–4.0)
MCHC: 34.1 g/dL (ref 30.0–36.0)
MCV: 96.9 fl (ref 78.0–100.0)
Monocytes Absolute: 0.5 10*3/uL (ref 0.1–1.0)
Monocytes Relative: 8.6 % (ref 3.0–12.0)
Neutro Abs: 3 10*3/uL (ref 1.4–7.7)
Neutrophils Relative %: 50.9 % (ref 43.0–77.0)
Platelets: 188 10*3/uL (ref 150.0–400.0)
RBC: 4.46 Mil/uL (ref 4.22–5.81)
RDW: 13.9 % (ref 11.5–15.5)
WBC: 5.9 10*3/uL (ref 4.0–10.5)

## 2023-11-10 LAB — VITAMIN B12: Vitamin B-12: 175 pg/mL — ABNORMAL LOW (ref 211–911)

## 2023-11-10 LAB — FOLATE: Folate: 10.9 ng/mL (ref >5.9)

## 2023-11-10 LAB — VITAMIN D 25 HYDROXY (VIT D DEFICIENCY, FRACTURES): VITD: 18.5 ng/mL — ABNORMAL LOW (ref 30.00–100.00)

## 2023-11-10 NOTE — Assessment & Plan Note (Signed)
 HTN: No recent ambulatory BPs, BP today 136/86.  Continue lisinopril , metoprolol , check the BMP and CBC Hyperlipidemia: On Crestor  and Zetia .  Controlled per last FLP. Anxiety depression insomnia: Lexapro , Wellbutrin , Ambien . Issues controlled  Vitamin D  deficiency: Checking levels. BPH: Per urology Neuropathy: Saw neurology 07/04/2020, multiple labs order, I do not believe they were completed.  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  Will get a B12, folic acid.  He decided to take only 1 gabapentin , because it was not helping much.  I still recommend to try BID. Forgetfulness: Mild forgetfulness without major problems, recommend to stay mentally active. Skin lesion: See physical exam, trial with an antifungal, Naftin . Vaccine advice: Due for Tdap.,  Patient aware RTC 4 to 5 months

## 2023-11-10 NOTE — Addendum Note (Signed)
 Addended by: Marylou Sobers D on: 11/10/2023 12:00 PM   Modules accepted: Orders

## 2023-11-10 NOTE — Telephone Encounter (Signed)
 Patient wants to reschedule his appointment. Should I schedule still for a cysto?

## 2023-11-10 NOTE — Telephone Encounter (Signed)
 Robert Gibbs

## 2023-11-11 LAB — BASIC METABOLIC PANEL WITH GFR
BUN: 12 mg/dL (ref 6–23)
CO2: 25 meq/L (ref 19–32)
Calcium: 8.5 mg/dL (ref 8.4–10.5)
Chloride: 102 meq/L (ref 96–112)
Creatinine, Ser: 0.76 mg/dL (ref 0.40–1.50)
GFR: 89.45 mL/min (ref >60.00)
Glucose, Bld: 115 mg/dL — ABNORMAL HIGH (ref 70–99)
Potassium: 4 meq/L (ref 3.5–5.1)
Sodium: 139 meq/L (ref 135–145)

## 2023-11-12 ENCOUNTER — Ambulatory Visit: Payer: Self-pay | Admitting: Internal Medicine

## 2023-11-12 MED ORDER — VITAMIN D (ERGOCALCIFEROL) 1.25 MG (50000 UNIT) PO CAPS
50000.0000 [IU] | ORAL_CAPSULE | ORAL | 0 refills | Status: AC
Start: 2023-11-12 — End: 2024-02-04

## 2023-11-12 NOTE — Addendum Note (Signed)
 Addended by: Shaneisha Burkel D on: 11/12/2023 01:09 PM   Modules accepted: Orders

## 2023-11-18 ENCOUNTER — Ambulatory Visit (INDEPENDENT_AMBULATORY_CARE_PROVIDER_SITE_OTHER)

## 2023-11-18 DIAGNOSIS — E538 Deficiency of other specified B group vitamins: Secondary | ICD-10-CM

## 2023-11-18 MED ORDER — CYANOCOBALAMIN 1000 MCG/ML IJ SOLN
1000.0000 ug | Freq: Once | INTRAMUSCULAR | Status: AC
  Administered 2023-11-18: 1000 ug via INTRAMUSCULAR

## 2023-11-18 NOTE — Progress Notes (Signed)
 Pt here for 1st weekly B12 injection per original order dated: 11/12/23 per Dr. Neomi Banks: B12 is low, recommend a injection weekly x 4 then monthly.   First B12 injection:11/18/23  Last B12 level: 11/10/23   B12 1000mcg given IM, left deltoid and pt tolerated injection well.  Next B12 injection scheduled for: 11/25/23

## 2023-11-25 ENCOUNTER — Ambulatory Visit (INDEPENDENT_AMBULATORY_CARE_PROVIDER_SITE_OTHER)

## 2023-11-25 DIAGNOSIS — E538 Deficiency of other specified B group vitamins: Secondary | ICD-10-CM | POA: Diagnosis not present

## 2023-11-25 MED ORDER — CYANOCOBALAMIN 1000 MCG/ML IJ SOLN
1000.0000 ug | Freq: Once | INTRAMUSCULAR | Status: AC
  Administered 2023-11-25: 1000 ug via INTRAMUSCULAR

## 2023-11-25 NOTE — Progress Notes (Signed)
 Pt here for monthly B12 injection per original order dated:  Dr. Neomi Banks  Last B12 injection: 11/18/23    B12 1000mcg given IM, and pt tolerated injection well.  Next B12 injection scheduled for: 12/03/23

## 2023-11-27 ENCOUNTER — Telehealth: Payer: Self-pay

## 2023-11-27 ENCOUNTER — Other Ambulatory Visit (HOSPITAL_COMMUNITY): Payer: Self-pay

## 2023-11-27 NOTE — Telephone Encounter (Signed)
 Copied from CRM [id number]. Topic: Clinical - Prescription Issue >> Nov 27, 2023 10:38 AM Star East wrote: Reason for CRM: Naftifine  HCl (NAFTIN ) 2 % GEL- insurance not covering per pharmacy- [phone]

## 2023-11-27 NOTE — Telephone Encounter (Signed)
 Pharmacy Patient Advocate Encounter   Received notification from Pt Calls Messages that prior authorization for Naftifine  2% gel is required/requested.   Insurance verification completed.   The patient is insured through [employer] .   Per test claim: PA required; PA submitted to above mentioned insurance via CoverMyMeds Key/confirmation #/EOC [id number] Status is pending

## 2023-11-30 ENCOUNTER — Other Ambulatory Visit (HOSPITAL_COMMUNITY): Payer: Self-pay

## 2023-11-30 MED ORDER — KETOCONAZOLE 2 % EX CREA: 1.0000 | TOPICAL_CREAM | Freq: Every day | CUTANEOUS | 0 refills | Status: DC

## 2023-11-30 NOTE — Telephone Encounter (Signed)
Sent nizoral

## 2023-11-30 NOTE — Telephone Encounter (Signed)
 Pharmacy Patient Advocate Encounter  Received notification from [employer] that Prior Authorization for Naftine 2% gel has been DENIED.  See denial reason below. No denial letter attached in CMM. Will attach denial letter to Media tab once received.   PA #/Case ID/Reference #: [id number]

## 2023-11-30 NOTE — Addendum Note (Signed)
 Addended by: Devonna Foley E on: 11/30/2023 11:59 AM   Modules accepted: Orders

## 2023-12-03 ENCOUNTER — Ambulatory Visit

## 2023-12-03 DIAGNOSIS — E538 Deficiency of other specified B group vitamins: Secondary | ICD-10-CM

## 2023-12-03 MED ORDER — CYANOCOBALAMIN 1000 MCG/ML IJ SOLN
1000.0000 ug | Freq: Once | INTRAMUSCULAR | Status: AC
  Administered 2023-12-03: 1000 ug via INTRAMUSCULAR

## 2023-12-03 NOTE — Progress Notes (Signed)
 Pt here for weekly B12 injection # 3 of 4 per original order dated: 11/12/23: B12 is low, recommend a injection weekly x 4 then monthly.   Last B12 injection:11/25/23  Last B12 level:  175 on 11/10/23  B12 1000mcg given IM, and pt tolerated injection well.  Next B12 injection scheduled for: 12/10/23.

## 2023-12-10 ENCOUNTER — Ambulatory Visit

## 2023-12-10 DIAGNOSIS — E538 Deficiency of other specified B group vitamins: Secondary | ICD-10-CM | POA: Diagnosis not present

## 2023-12-10 MED ORDER — CYANOCOBALAMIN 1000 MCG/ML IJ SOLN
1000.0000 ug | Freq: Once | INTRAMUSCULAR | Status: AC
Start: 2023-12-10 — End: 2023-12-10

## 2023-12-10 NOTE — Progress Notes (Signed)
 Pt here for weekly B12 injection per original order dated: 11/12/23  B12 is low, recommend a injection weekly x 4 then monthly.   Last B12 injection:12/03/23 Last B12 level:11/10/23 was 175 B12 1000mcg given IM, and pt tolerated injection well.  Next B12 injection scheduled for: one month B12 to be done on 01/07/24.

## 2023-12-16 ENCOUNTER — Encounter: Payer: Self-pay | Admitting: Urology

## 2023-12-16 ENCOUNTER — Ambulatory Visit (INDEPENDENT_AMBULATORY_CARE_PROVIDER_SITE_OTHER): Admitting: Urology

## 2023-12-16 VITALS — BP 172/93 | HR 98 | Ht 70.0 in | Wt 295.0 lb

## 2023-12-16 DIAGNOSIS — Z87440 Personal history of urinary (tract) infections: Secondary | ICD-10-CM | POA: Diagnosis not present

## 2023-12-16 DIAGNOSIS — N138 Other obstructive and reflux uropathy: Secondary | ICD-10-CM

## 2023-12-16 DIAGNOSIS — R972 Elevated prostate specific antigen [PSA]: Secondary | ICD-10-CM | POA: Diagnosis not present

## 2023-12-16 DIAGNOSIS — R3129 Other microscopic hematuria: Secondary | ICD-10-CM

## 2023-12-16 DIAGNOSIS — N390 Urinary tract infection, site not specified: Secondary | ICD-10-CM

## 2023-12-16 DIAGNOSIS — N401 Enlarged prostate with lower urinary tract symptoms: Secondary | ICD-10-CM

## 2023-12-16 LAB — URINALYSIS, ROUTINE W REFLEX MICROSCOPIC
Bilirubin, UA: NEGATIVE
Glucose, UA: NEGATIVE
Leukocytes,UA: NEGATIVE
Nitrite, UA: NEGATIVE
Specific Gravity, UA: 1.025 (ref 1.005–1.030)
Urobilinogen, Ur: 0.2 mg/dL (ref 0.2–1.0)
pH, UA: 5.5 (ref 5.0–7.5)

## 2023-12-16 LAB — MICROSCOPIC EXAMINATION

## 2023-12-16 MED ORDER — GEMTESA 75 MG PO TABS: 75.0000 mg | ORAL_TABLET | Freq: Every day | ORAL | 11 refills | Status: DC

## 2023-12-16 NOTE — Progress Notes (Signed)
 Assessment: 1. BPH with obstruction/lower urinary tract symptoms   2. Frequent UTI   3. Microscopic hematuria; prior evaluation 2009   4. Elevated PSA; negative biopsy 2009     Plan: PSA today Resume Gemtesa  75 mg daily.  Rx sent. No evidence of UTI today. Return to office in 2 months  Chief Complaint:  Chief Complaint  Patient presents with   Benign Prostatic Hypertrophy    History of Present Illness:  Robert Gibbs is a 73 y.o. male who is seen for continued evaluation of BPH with LUTS, microscopic hematuria, and elevated PSA. He has a history of UTI's. Urine culture results: 2/22 50-100K Enterococcus 7/23 10K Enterococcus 12/23 >100K Enterococcus  At his visit in March 2024, he continued to have urinary frequency, urgency, and nocturia 3-4 times.  He also had associated urge incontinence.  No dysuria or gross hematuria.  He reported voiding with a good stream.  He was not taking any medications for his urinary symptoms.  He does not remember if he took the Myrbetriq and if it improved his symptoms. IPSS = 26.  PVR = 37 mL Urine culture from 09/05/2022 grew 10-20 5K Enterococcus.  Treated with Cipro  x 7 days. He was also given a trial of Gemtesa .  He does not remember if this improved his symptoms. He did not return for follow-up as scheduled.  He was seen by his PCP for increased frequency, hesitancy, and gross hematuria. Urine culture grew >100 K E. coli.  He was given an injection of Rocephin  and started on Cipro . PSA drawn at the time of UTI was 35.57. At his visit in 12/24, he reported symptoms of frequency, urgency, incontinence, and gross hematuria.  No fevers or chills.  No flank pain. He has not returned for follow-up since that time.  He presents today for continued evaluation of his lower urinary tract symptoms.  He has not been taking the Gemtesa  as he ran out of samples following his last visit 6 months ago.  He continues to have symptoms of frequency,  urgency, and incontinence as well as a decrease stream.  No dysuria or gross hematuria. IPSS = 23/5. No recent UTI symptoms.  Urologic History:  He has a history of elevated PSA. He had a previous negative prostate biopsy in June 2009.  PSA results: 6/09 4.7 4/13 2.36 10/15 2.92 12/16 3.63 12/16 40.33 5/19 25.47 2/22 7.54 12/23 9.95   He has a history of LUTS. He has tried various alpha blockers which did not help and were too expensive including Flomax , Rapaflo, and Uroxatral. Daily Cialis 5 mg worked the best for his LUTS but insurance would not approve this. He tried alfuzosin which did not improve his symptoms. He reported weak FOS, frequency, urgency, incomplete emptying, and an increase in his frequency and urgency. He had intermittent episodes of urge incontinence.  He resumed Cialis 5 mg daily after his visit in 12/16 and his LUTS improved. He was seen by Dr. Amon for dysuria, urgency, and strong urine odor. He was diagnosed with prostatitis and UTI. Urine culture grew E. Coli.  PSA was 40. He was initially treated with Bactrim  x 4 weeks, then changed to Cipro  x 2 weeks.  He was diagnosed with prostatitis and UTI in 5/19. Urine culture grew E. Coli. His symptoms resolved with antibiotics.  He was not taking daily tadalafil. AUA score = 31. He was diagnosed with prostatitis and treated with Cipro  for 3 weeks. He noted some slight improvement in  his symptoms. He continued to have urinary frequency, urgency, nocturia x 5, sensation of incomplete emptying and post void dribbling. No dysuria or gross hematuria. AUA score = 30.  He underwent microscopic hematuria evaluation with CT and cysto in May 2009. He was found to have a right renal cyst. Hematuria profile from 12/16 was negative for malignancy.   At his visit in 12/21, he noted worsening of his urinary symptoms within the prior 1-2 months. He was admitted to the hospital in October 2021 with sepsis. He was also diagnosed with  prostatitis. Urinalysis showed 57 RBCs, 10 WBCs. Urine culture grew <10K colonies. He reported increased frequency, urgency, nocturia x4, and urge incontinence. His symptoms persisted following treatment. No dysuria or gross hematuria. He continued to have frequency, nocturia x4, urgency, and urge incontinence. AUA score = 31. He was not on any medical therapy for his urinary symptoms at that time. He was given a trial of Myrbetriq 50 mg daily.  Portions of the above documentation were copied from a prior visit for review purposes only.    Past Medical History:  Past Medical History:  Diagnosis Date   Anxiety and depression    Arthritis    Barrett esophagus    BPH (benign prostatic hyperplasia)    CTS (carpal tunnel syndrome)    h/o   Edema of both legs    legs , ankles and feet   Erectile dysfunction    GERD (gastroesophageal reflux disease) 03/18/2012   H/O hiatal hernia    HTN (hypertension)    Hyperlipidemia    Microscopic hematuria    Neuropathy    OSA (obstructive sleep apnea) 03/18/2012   on BiPap   Prostatitis    PVD (peripheral vascular disease) (HCC)    Spinal stenosis 06-2012   saw neuro w/ neuropathy, dx w/ spinal stenosis   Varicose veins    Vitamin D  deficiency    Wears glasses     Past Surgical History:  Past Surgical History:  Procedure Laterality Date   ARCH BAR REMOVAL     BACK SURGERY  2002   L4-L5   ELBOW SURGERY  1992   for tennis elbow-rt   ESOPHAGOGASTRODUODENOSCOPY  04/2015   w/ biopsy, short tongue of Barrett's without obvious nodularity, mass or esophagitis seen   EYE SURGERY Right    cataract with lens implant   FOOT SURGERY     R, for plantar fasciitis   HERNIA REPAIR     x 2 as a child-ing    KNEE ARTHROSCOPY WITH MENISCAL REPAIR Bilateral 05/04/2013   Procedure: BILATERAL KNEE ARTHROSCOPY WITH POSSIBLE CHONDROPLASTY AND MENISCECTOMY ;  Surgeon: Dempsey JINNY Sensor, MD;  Location: [facility];  Service: Orthopedics;   Laterality: Bilateral;  Right chrondroplasty, debridement, , partial medial menisectomy, removal of loose bodies.    LIGATION / DIVISION SAPHENOUS VEIN     both legs   PROSTATE BIOPSY     2010, (-)   TONSILLECTOMY     TOTAL KNEE ARTHROPLASTY Right 11/07/2013   Procedure: TOTAL KNEE ARTHROPLASTY;  Surgeon: Dempsey JINNY Sensor, MD;  Location: [facility];  Service: Orthopedics;  Laterality: Right;   TOTAL KNEE ARTHROPLASTY Left 03/01/2014   Procedure: TOTAL KNEE ARTHROPLASTY;  Surgeon: Dempsey JINNY Sensor, MD;  Location: [facility];  Service: Orthopedics;  Laterality: Left;   UPPER GASTROINTESTINAL ENDOSCOPY     UVULOPALATOPHARYNGOPLASTY (UPPP)/TONSILLECTOMY/SEPTOPLASTY  1990    Allergies:  Allergies  Allergen Reactions   Codeine Itching   Nsaids  Acid Reflux   Simvastatin     Mimics heart attack   Tolmetin     Acid Reflux    Family History:  Family History  Problem Relation Age of Onset   CAD Father        ?   Alzheimer's disease Mother    Stroke Brother    Colon cancer Neg Hx    Prostate cancer Neg Hx    Diabetes Neg Hx    Cancer Neg Hx     Social History:  Social History   Tobacco Use   Smoking status: Former    Current packs/day: 0.00    Average packs/day: 1 pack/day for 40.0 years (40.0 ttl pk-yrs)    Types: Cigarettes    Start date: 74    Quit date: 06/29/2008    Years since quitting: 15.4   Smokeless tobacco: Never   Tobacco comments:    quit 2010  Substance Use Topics   Alcohol use: Yes    Alcohol/week: 3.0 standard drinks of alcohol    Types: 3 Standard drinks or equivalent per week    Comment: 2-3 large drinks, Vodka, 1-2 times a week   Drug use: No    ROS: Constitutional:  Negative for fever, chills, weight loss CV: Negative for chest pain, previous MI, hypertension Respiratory:  Negative for shortness of breath, wheezing, sleep apnea, frequent cough GI:  Negative for nausea, vomiting, bloody stool, GERD  Physical exam: BP (!) 172/93   Pulse 98   Ht 5' 10  (1.778 m)   Wt 295 lb (133.8 kg)   BMI 42.33 kg/m  GENERAL APPEARANCE:  Well appearing, well developed, well nourished, NAD HEENT:  Atraumatic, normocephalic, oropharynx clear NECK:  Supple without lymphadenopathy or thyromegaly ABDOMEN:  Soft, non-tender, no masses EXTREMITIES:  Moves all extremities well, without clubbing, cyanosis, or edema NEUROLOGIC:  Alert and oriented x 3, normal gait, CN II-XII grossly intact MENTAL STATUS:  appropriate BACK:  Non-tender to palpation, No CVAT SKIN:  Warm, dry, and intact  Results: U/A: 0-5 WBC, 11-30 RBC

## 2023-12-17 ENCOUNTER — Ambulatory Visit: Payer: Self-pay | Admitting: Urology

## 2023-12-17 ENCOUNTER — Other Ambulatory Visit: Payer: Self-pay | Admitting: Urology

## 2023-12-17 ENCOUNTER — Telehealth: Payer: Self-pay | Admitting: Urology

## 2023-12-17 DIAGNOSIS — N138 Other obstructive and reflux uropathy: Secondary | ICD-10-CM

## 2023-12-17 LAB — PSA: Prostate Specific Ag, Serum: 8.9 ng/mL — ABNORMAL HIGH (ref 0.0–4.0)

## 2023-12-17 MED ORDER — SOLIFENACIN SUCCINATE 5 MG PO TABS: 5.0000 mg | ORAL_TABLET | Freq: Every day | ORAL | 11 refills | Status: DC

## 2023-12-17 NOTE — Telephone Encounter (Signed)
 Called pt back after he lvm about being concerned about blood in his urine if I heard the msg clearly his phone was breaking up. No anwser will try again at later time

## 2024-01-07 ENCOUNTER — Ambulatory Visit (INDEPENDENT_AMBULATORY_CARE_PROVIDER_SITE_OTHER)

## 2024-01-07 DIAGNOSIS — E538 Deficiency of other specified B group vitamins: Secondary | ICD-10-CM

## 2024-01-07 MED ORDER — CYANOCOBALAMIN 1000 MCG/ML IJ SOLN
1000.0000 ug | Freq: Once | INTRAMUSCULAR | Status: AC
  Administered 2024-01-07: 1000 ug via INTRAMUSCULAR

## 2024-01-07 NOTE — Progress Notes (Signed)
 Pt here for monthly B12 injection per BLYTH, STACEY A   B12 1000mcg given left deltoid IM and pt tolerated injection well.  Next B12 injection scheduled for 02/03/24

## 2024-02-03 ENCOUNTER — Ambulatory Visit

## 2024-02-16 ENCOUNTER — Ambulatory Visit: Admitting: Urology

## 2024-02-16 ENCOUNTER — Encounter: Payer: Self-pay | Admitting: Urology

## 2024-02-16 VITALS — BP 153/70 | HR 54 | Ht 70.0 in | Wt 290.0 lb

## 2024-02-16 DIAGNOSIS — R972 Elevated prostate specific antigen [PSA]: Secondary | ICD-10-CM

## 2024-02-16 DIAGNOSIS — Z87440 Personal history of urinary (tract) infections: Secondary | ICD-10-CM

## 2024-02-16 DIAGNOSIS — N529 Male erectile dysfunction, unspecified: Secondary | ICD-10-CM | POA: Diagnosis not present

## 2024-02-16 DIAGNOSIS — N138 Other obstructive and reflux uropathy: Secondary | ICD-10-CM | POA: Diagnosis not present

## 2024-02-16 DIAGNOSIS — R3129 Other microscopic hematuria: Secondary | ICD-10-CM

## 2024-02-16 DIAGNOSIS — N390 Urinary tract infection, site not specified: Secondary | ICD-10-CM | POA: Diagnosis not present

## 2024-02-16 DIAGNOSIS — N401 Enlarged prostate with lower urinary tract symptoms: Secondary | ICD-10-CM | POA: Diagnosis not present

## 2024-02-16 LAB — URINALYSIS, ROUTINE W REFLEX MICROSCOPIC
Bilirubin, UA: NEGATIVE
Glucose, UA: NEGATIVE
Ketones, UA: NEGATIVE
Leukocytes,UA: NEGATIVE
Nitrite, UA: NEGATIVE
Protein,UA: NEGATIVE
Specific Gravity, UA: 1.02 (ref 1.005–1.030)
Urobilinogen, Ur: 0.2 mg/dL (ref 0.2–1.0)
pH, UA: 5.5 (ref 5.0–7.5)

## 2024-02-16 LAB — MICROSCOPIC EXAMINATION

## 2024-02-16 LAB — BLADDER SCAN AMB NON-IMAGING

## 2024-02-16 MED ORDER — SOLIFENACIN SUCCINATE 10 MG PO TABS: 10.0000 mg | ORAL_TABLET | Freq: Every day | ORAL | 11 refills | Status: AC

## 2024-02-16 MED ORDER — TADALAFIL 20 MG PO TABS: 20.0000 mg | ORAL_TABLET | Freq: Every day | ORAL | 11 refills | Status: AC | PRN

## 2024-02-16 NOTE — Progress Notes (Signed)
 Assessment: 1. BPH with obstruction/lower urinary tract symptoms   2. Frequent UTI   3. Microscopic hematuria; prior evaluation 2009   4. Elevated PSA; negative biopsy 2009     Plan: PHI today Increase solifenacin  to 10 mg daily.  Potential side effects discussed. Return to office in 4-6 weeks for cystoscopy Patient for tadalafil  20 mg as needed provided for ED.  Chief Complaint:  Chief Complaint  Patient presents with   Benign Prostatic Hypertrophy    History of Present Illness:  Robert Gibbs is a 73 y.o. male who is seen for continued evaluation of BPH with LUTS, microscopic hematuria, and elevated PSA. He has a history of UTI's. Urine culture results: 2/22 50-100K Enterococcus 7/23 10K Enterococcus 12/23 >100K Enterococcus  At his visit in March 2024, he continued to have urinary frequency, urgency, and nocturia 3-4 times.  He also had associated urge incontinence.  No dysuria or gross hematuria.  He reported voiding with a good stream.  He was not taking any medications for his urinary symptoms.  He does not remember if he took the Myrbetriq and if it improved his symptoms. IPSS = 26.  PVR = 37 mL Urine culture from 09/05/2022 grew 10-20 5K Enterococcus.  Treated with Cipro  x 7 days. He was also given a trial of Gemtesa .  He does not remember if this improved his symptoms. He did not return for follow-up as scheduled.  He was seen by his PCP for increased frequency, hesitancy, and gross hematuria. Urine culture grew >100 K E. coli.  He was given an injection of Rocephin  and started on Cipro . PSA drawn at the time of UTI was 35.57. At his visit in 12/24, he reported symptoms of frequency, urgency, incontinence, and gross hematuria.  No fevers or chills.  No flank pain. He had not returned for follow-up since that time.  At his visit in June 2025, he continued with lower urinary tract symptoms.  He had not been taking the Gemtesa  as he ran out of samples following  his last visit 6 months prior.  He continued to have symptoms of frequency, urgency, and incontinence as well as a decrease stream.  No dysuria or gross hematuria. IPSS = 23/5. No UTI symptoms. PSA from 6/25: 8.9  He returns today for follow-up.  He is currently on solifenacin  5 mg daily for his lower urinary tract symptoms.  He feels like the medicine has only slightly improved his symptoms.  He continues to have daytime frequency voiding 6 times per day, nocturia x 3, urgency, and decreased stream.  No dysuria or gross hematuria. IPSS = 21/5. He also reports difficulty achieving and maintaining his erections.  He has not tried any medical therapy to date.  No decrease in his libido.  No pain or curvature associated with erections.  Urologic History:  He has a history of elevated PSA. He had a previous negative prostate biopsy in June 2009.  PSA results: 6/09 4.7 4/13 2.36 10/15 2.92 12/16 3.63 12/16 40.33 5/19 25.47 2/22 7.54 12/23 9.95   He has a history of LUTS. He has tried various alpha blockers which did not help and were too expensive including Flomax , Rapaflo, and Uroxatral. Daily Cialis  5 mg worked the best for his LUTS but insurance would not approve this. He tried alfuzosin which did not improve his symptoms. He reported weak FOS, frequency, urgency, incomplete emptying, and an increase in his frequency and urgency. He had intermittent episodes of urge incontinence.  He  resumed Cialis  5 mg daily after his visit in 12/16 and his LUTS improved. He was seen by Dr. Amon for dysuria, urgency, and strong urine odor. He was diagnosed with prostatitis and UTI. Urine culture grew E. Coli.  PSA was 40. He was initially treated with Bactrim  x 4 weeks, then changed to Cipro  x 2 weeks.  He was diagnosed with prostatitis and UTI in 5/19. Urine culture grew E. Coli. His symptoms resolved with antibiotics.  He was not taking daily tadalafil . AUA score = 31. He was diagnosed with prostatitis and  treated with Cipro  for 3 weeks. He noted some slight improvement in his symptoms. He continued to have urinary frequency, urgency, nocturia x 5, sensation of incomplete emptying and post void dribbling. No dysuria or gross hematuria. AUA score = 30.  He underwent microscopic hematuria evaluation with CT and cysto in May 2009. He was found to have a right renal cyst. Hematuria profile from 12/16 was negative for malignancy.   At his visit in 12/21, he noted worsening of his urinary symptoms within the prior 1-2 months. He was admitted to the hospital in October 2021 with sepsis. He was also diagnosed with prostatitis. Urinalysis showed 57 RBCs, 10 WBCs. Urine culture grew <10K colonies. He reported increased frequency, urgency, nocturia x4, and urge incontinence. His symptoms persisted following treatment. No dysuria or gross hematuria. He continued to have frequency, nocturia x4, urgency, and urge incontinence. AUA score = 31. He was not on any medical therapy for his urinary symptoms at that time. He was given a trial of Myrbetriq 50 mg daily.  Portions of the above documentation were copied from a prior visit for review purposes only.    Past Medical History:  Past Medical History:  Diagnosis Date   Anxiety and depression    Arthritis    Barrett esophagus    BPH (benign prostatic hyperplasia)    CTS (carpal tunnel syndrome)    h/o   Edema of both legs    legs , ankles and feet   Erectile dysfunction    GERD (gastroesophageal reflux disease) 03/18/2012   H/O hiatal hernia    HTN (hypertension)    Hyperlipidemia    Microscopic hematuria    Neuropathy    OSA (obstructive sleep apnea) 03/18/2012   on BiPap   Prostatitis    PVD (peripheral vascular disease) (HCC)    Spinal stenosis 06-2012   saw neuro w/ neuropathy, dx w/ spinal stenosis   Varicose veins    Vitamin D  deficiency    Wears glasses     Past Surgical History:  Past Surgical History:  Procedure Laterality Date    ARCH BAR REMOVAL     BACK SURGERY  2002   L4-L5   ELBOW SURGERY  1992   for tennis elbow-rt   ESOPHAGOGASTRODUODENOSCOPY  04/2015   w/ biopsy, short tongue of Barrett's without obvious nodularity, mass or esophagitis seen   EYE SURGERY Right    cataract with lens implant   FOOT SURGERY     R, for plantar fasciitis   HERNIA REPAIR     x 2 as a child-ing    KNEE ARTHROSCOPY WITH MENISCAL REPAIR Bilateral 05/04/2013   Procedure: BILATERAL KNEE ARTHROSCOPY WITH POSSIBLE CHONDROPLASTY AND MENISCECTOMY ;  Surgeon: Dempsey JINNY Sensor, MD;  Location: [facility];  Service: Orthopedics;  Laterality: Bilateral;  Right chrondroplasty, debridement, , partial medial menisectomy, removal of loose bodies.    LIGATION / DIVISION SAPHENOUS VEIN  both legs   PROSTATE BIOPSY     2010, (-)   TONSILLECTOMY     TOTAL KNEE ARTHROPLASTY Right 11/07/2013   Procedure: TOTAL KNEE ARTHROPLASTY;  Surgeon: Dempsey JINNY Sensor, MD;  Location: [facility];  Service: Orthopedics;  Laterality: Right;   TOTAL KNEE ARTHROPLASTY Left 03/01/2014   Procedure: TOTAL KNEE ARTHROPLASTY;  Surgeon: Dempsey JINNY Sensor, MD;  Location: [facility];  Service: Orthopedics;  Laterality: Left;   UPPER GASTROINTESTINAL ENDOSCOPY     UVULOPALATOPHARYNGOPLASTY (UPPP)/TONSILLECTOMY/SEPTOPLASTY  1990    Allergies:  Allergies  Allergen Reactions   Codeine Itching   Nsaids     Acid Reflux   Simvastatin     Mimics heart attack   Tolmetin     Acid Reflux    Family History:  Family History  Problem Relation Age of Onset   CAD Father        ?   Alzheimer's disease Mother    Stroke Brother    Colon cancer Neg Hx    Prostate cancer Neg Hx    Diabetes Neg Hx    Cancer Neg Hx     Social History:  Social History   Tobacco Use   Smoking status: Former    Current packs/day: 0.00    Average packs/day: 1 pack/day for 40.0 years (40.0 ttl pk-yrs)    Types: Cigarettes    Start date: 73    Quit date: 06/29/2008    Years since quitting:  15.6   Smokeless tobacco: Never   Tobacco comments:    quit 2010  Substance Use Topics   Alcohol use: Yes    Alcohol/week: 3.0 standard drinks of alcohol    Types: 3 Standard drinks or equivalent per week    Comment: 2-3 large drinks, Vodka, 1-2 times a week   Drug use: No    ROS: Constitutional:  Negative for fever, chills, weight loss CV: Negative for chest pain, previous MI, hypertension Respiratory:  Negative for shortness of breath, wheezing, sleep apnea, frequent cough GI:  Negative for nausea, vomiting, bloody stool, GERD  Physical exam: BP (!) 153/70   Pulse (!) 54   Ht 5' 10 (1.778 m)   Wt 290 lb (131.5 kg)   BMI 41.61 kg/m  GENERAL APPEARANCE:  Well appearing, well developed, well nourished, NAD HEENT:  Atraumatic, normocephalic, oropharynx clear NECK:  Supple without lymphadenopathy or thyromegaly ABDOMEN:  Soft, non-tender, no masses EXTREMITIES:  Moves all extremities well, without clubbing, cyanosis, or edema NEUROLOGIC:  Alert and oriented x 3, normal gait, CN II-XII grossly intact MENTAL STATUS:  appropriate BACK:  Non-tender to palpation, No CVAT SKIN:  Warm, dry, and intact  Results: U/A: 0-5 WBC, 3-10 RBC, few bacteria  PVR = 72 ml

## 2024-02-18 ENCOUNTER — Ambulatory Visit: Payer: Self-pay | Admitting: Urology

## 2024-02-18 LAB — PHI SCORE REFLEX
% Free PSA: 25.2 %
PSA, Free: 1.68 ng/mL
Prostate Heath Index Score: 33.6
p2PSA: 21.9 pg/mL

## 2024-02-18 LAB — PROSTATE HEALTH INDEX: Prostate Specific Ag: 6.7 ng/mL — ABNORMAL HIGH (ref 0.0–3.9)

## 2024-02-24 ENCOUNTER — Telehealth: Payer: Self-pay | Admitting: Internal Medicine

## 2024-02-24 DIAGNOSIS — K2270 Barrett's esophagus without dysplasia: Secondary | ICD-10-CM

## 2024-02-24 NOTE — Telephone Encounter (Signed)
 Yes , place referral , likely due for EGD

## 2024-02-24 NOTE — Telephone Encounter (Signed)
 Copied from CRM [id number]. Topic: Referral - Question >> Feb 24, 2024  2:26 PM Viola FALCON wrote: Patient last seen gastroenterology in 2020, he wants to know if he can have another referral to gastroenterology for egd/colonoscopy - please update him via MyChart portal

## 2024-02-24 NOTE — Telephone Encounter (Signed)
 Pt w/ hx of Barretts esophagus, due for EGD? Cscope not due until 2027. Okay to place referral back to [facility].

## 2024-02-25 DIAGNOSIS — K2270 Barrett's esophagus without dysplasia: Secondary | ICD-10-CM | POA: Insufficient documentation

## 2024-02-25 NOTE — Telephone Encounter (Signed)
 GI referral placed. Mychart message sent to Pt as requested.

## 2024-03-02 ENCOUNTER — Ambulatory Visit: Payer: Self-pay | Admitting: *Deleted

## 2024-03-02 NOTE — Telephone Encounter (Signed)
 LMOM asking for call back, PCP out of office until 9/16, asked that he call back, we can schedule him w/ another provider in the office either today or tomorrow.

## 2024-03-02 NOTE — Telephone Encounter (Signed)
 FYI Only or Action Required?: Action required by provider: request for appointment.  Patient was last seen in primary care on 11/09/2023 by Amon Aloysius BRAVO, MD.  Called Nurse Triage reporting Leg Swelling.  Symptoms began several days ago.  Interventions attempted: Rest, hydration, or home remedies.  Symptoms are: gradually worsening.  Triage Disposition: See HCP Within 4 Hours (Or PCP Triage)  Patient/caregiver understands and will follow disposition?: No, wishes to speak with PCP   Please advise if appt can be made , patient requesting call back.            Copied from CRM #[id number]. Topic: Clinical - Red Word Triage >> Mar 02, 2024 11:00 AM Thersia BROCKS wrote: [facility] that prompted transfer to Nurse Triage:Patient called in regarding  legs and feet has been swollen and ankle stated it is really bad , real red painful to walk Reason for Disposition  SEVERE leg swelling (e.g., swelling extends above knee, entire leg is swollen, weeping fluid)    No weeping fluid and only extends above knee not thigh  Answer Assessment - Initial Assessment Questions No availble appt today . Recommended UC and patient does not want to go unless PCP recommended. Patient has appt tomorrow for B12 shot at 1115 am. Please advise if patient can be seen today or tomorrow by any provider. Patient would rather see PCP.  CAL notified of patient declined UC.     1. ONSET: When did the swelling start? (e.g., minutes, hours, days)     2 days  2. LOCATION: What part of the leg is swollen?  Are both legs swollen or just one leg?     Up to knees  3. SEVERITY: How bad is the swelling? (e.g., localized; mild, moderate, severe)     severe 4. REDNESS: Is there redness or signs of infection?     Redness to legs  5. PAIN: Is the swelling painful to touch? If Yes, ask: How painful is it?   (Scale 1-10; mild, moderate or severe)     At rest 7/10 up walking 9/10 6. FEVER: Do you have a fever? If  Yes, ask: What is it, how was it measured, and when did it start?      na 7. CAUSE: What do you think is causing the leg swelling?     Unsure  8. MEDICAL HISTORY: Do you have a history of blood clots (e.g., DVT), cancer, heart failure, kidney disease, or liver failure?     Hx  9. RECURRENT SYMPTOM: Have you had leg swelling before? If Yes, ask: When was the last time? What happened that time?     Yes not like this  10. OTHER SYMPTOMS: Do you have any other symptoms? (e.g., chest pain, difficulty breathing)       Denies chest pain no difficulty breathing . No fever. Left red, tight, swollen shiny. Painful to walk 11. PREGNANCY: Is there any chance you are pregnant? When was your last menstrual period?       na  Protocols used: Leg Swelling and Edema-A-AH

## 2024-03-02 NOTE — Telephone Encounter (Signed)
 Pt. Called back, appointment made for tomorrow.

## 2024-03-03 ENCOUNTER — Encounter: Payer: Self-pay | Admitting: Family Medicine

## 2024-03-03 ENCOUNTER — Ambulatory Visit

## 2024-03-03 ENCOUNTER — Ambulatory Visit (INDEPENDENT_AMBULATORY_CARE_PROVIDER_SITE_OTHER): Admitting: Family Medicine

## 2024-03-03 VITALS — BP 137/76 | HR 81 | Ht 70.0 in | Wt 293.0 lb

## 2024-03-03 DIAGNOSIS — L03119 Cellulitis of unspecified part of limb: Secondary | ICD-10-CM | POA: Diagnosis not present

## 2024-03-03 DIAGNOSIS — R600 Localized edema: Secondary | ICD-10-CM | POA: Diagnosis not present

## 2024-03-03 DIAGNOSIS — E538 Deficiency of other specified B group vitamins: Secondary | ICD-10-CM

## 2024-03-03 MED ORDER — FUROSEMIDE 20 MG PO TABS: 20.0000 mg | ORAL_TABLET | Freq: Every day | ORAL | 0 refills | Status: DC

## 2024-03-03 MED ORDER — POTASSIUM CHLORIDE CRYS ER 20 MEQ PO TBCR: 20.0000 meq | EXTENDED_RELEASE_TABLET | Freq: Every day | ORAL | 0 refills | Status: DC

## 2024-03-03 MED ORDER — CYANOCOBALAMIN 1000 MCG/ML IJ SOLN
1000.0000 ug | Freq: Once | INTRAMUSCULAR | Status: AC
  Administered 2024-03-03: 1000 ug via INTRAMUSCULAR

## 2024-03-03 MED ORDER — DOXYCYCLINE HYCLATE 100 MG PO TABS: 100.0000 mg | ORAL_TABLET | Freq: Two times a day (BID) | ORAL | 0 refills | Status: AC

## 2024-03-03 NOTE — Progress Notes (Signed)
 Acute Office Visit  Subjective:     Patient ID: Robert Gibbs, male    DOB: 1951-03-31, 73 y.o.   MRN: [id number]  Chief Complaint  Patient presents with   Edema    HPI Patient is in today for edema.   Discussed the use of AI scribe software for clinical note transcription with the patient, who gave verbal consent to proceed.  History of Present Illness Robert Gibbs is a 73 year old male with chronic swelling and peripheral neuropathy who presents with acute leg swelling and redness.  His legs began swelling a couple of days ago, with this episode being more severe than his usual chronic swelling. He describes significant tightness and warmth, feeling as if his skin is 'going to bust.' The swelling is accompanied by new redness, which is atypical for him. He experiences chills but no fevers. There is no history of recent cuts or scrapes. He mentions difficulty fitting into his shoes due to the puffiness, which is unusual as typically only his ankles swell.  He has a history of peripheral neuropathy and has previously been septic from cellulitis. He recalls an incident that looked similar to the current situation, which required IV antibiotics. Past evaluations, including an echocardiogram showed normal heart function.  He tries to manage the swelling by propping his feet up, although they are not elevated above heart level. He attempts to stay active by getting up every couple of hours during the day. He reports a moderate intake of water and has recently increased his salt intake slightly.         ROS All review of systems negative except what is listed in the HPI      Objective:    BP 137/76   Pulse 81   Ht 5' 10 (1.778 m)   Wt 293 lb (132.9 kg)   SpO2 97%   BMI 42.04 kg/m    Physical Exam Vitals reviewed.  Constitutional:      General: He is not in acute distress.    Appearance: Normal appearance. He is obese.  Cardiovascular:     Rate and Rhythm:  Normal rate and regular rhythm.  Pulmonary:     Effort: Pulmonary effort is normal.     Breath sounds: Normal breath sounds. No wheezing, rhonchi or rales.  Musculoskeletal:     Right lower leg: Edema present.     Left lower leg: Edema present.  Skin:    Findings: Erythema present.  Neurological:     Mental Status: He is alert and oriented to person, place, and time.  Psychiatric:        Mood and Affect: Mood normal.        Behavior: Behavior normal.        Thought Content: Thought content normal.        Judgment: Judgment normal.        BLE 4+ pitting edema with spreading erythema and heat to touch (left leg worse than right)    No results found for any visits on 03/03/24.      Assessment & Plan:   Problem List Items Addressed This Visit   None Visit Diagnoses       Cellulitis of lower extremity, unspecified laterality    -  Primary   Relevant Medications   doxycycline  (VIBRA -TABS) 100 MG tablet     Vitamin B12 deficiency       Relevant Medications   cyanocobalamin  (VITAMIN B12) injection 1,000 mcg (Completed)  Bilateral lower extremity edema       Relevant Medications   furosemide  (LASIX ) 20 MG tablet   potassium chloride  SA (KLOR-CON  M) 20 MEQ tablet      Patient reports chronic BLE edema, but this is worse than his usual. Swelling is more intense/painful, and redness is new. Legs hot to to touch - left worse than right. No open wounds or drainage. No chest pain or dyspnea or new cough.  - Sending in 3 days of lasix  (+potassium) for fluid retention. Last renal function stable. Echo with normal EF last year.  - Elevate legs when seated, minimize sodium in diet - Concern for developing cellulitis given erythema and heat (states he had to be hospitalized for this in the past). Starting doxycycline .  - Monitor for new/worsening symptoms - ED for  any severe symptoms.  - Return early next week for reevaluation.   Meds ordered this encounter  Medications    cyanocobalamin  (VITAMIN B12) injection 1,000 mcg   furosemide  (LASIX ) 20 MG tablet    Sig: Take 1 tablet (20 mg total) by mouth daily.    Dispense:  3 tablet    Refill:  0    Supervising Provider:   DOMENICA BLACKBIRD A [[id number]]   doxycycline  (VIBRA -TABS) 100 MG tablet    Sig: Take 1 tablet (100 mg total) by mouth 2 (two) times daily for 5 days.    Dispense:  10 tablet    Refill:  0    Supervising Provider:   DOMENICA BLACKBIRD A [[id number]]   potassium chloride  SA (KLOR-CON  M) 20 MEQ tablet    Sig: Take 1 tablet (20 mEq total) by mouth daily for 3 days.    Dispense:  3 tablet    Refill:  0    Supervising Provider:   DOMENICA BLACKBIRD A [[id number]]    Return in about 4 days (around 03/07/2024) for skin check .  Waddell KATHEE Mon, NP

## 2024-03-07 ENCOUNTER — Ambulatory Visit: Admitting: Internal Medicine

## 2024-03-07 ENCOUNTER — Ambulatory Visit (INDEPENDENT_AMBULATORY_CARE_PROVIDER_SITE_OTHER): Admitting: Family Medicine

## 2024-03-07 ENCOUNTER — Encounter: Payer: Self-pay | Admitting: Family Medicine

## 2024-03-07 VITALS — BP 147/56 | HR 96 | Ht 70.0 in | Wt 287.0 lb

## 2024-03-07 DIAGNOSIS — R600 Localized edema: Secondary | ICD-10-CM | POA: Diagnosis not present

## 2024-03-07 LAB — BASIC METABOLIC PANEL WITH GFR
BUN: 13 mg/dL (ref 6–23)
CO2: 25 meq/L (ref 19–32)
Calcium: 9.5 mg/dL (ref 8.4–10.5)
Chloride: 104 meq/L (ref 96–112)
Creatinine, Ser: 0.74 mg/dL (ref 0.40–1.50)
GFR: 89.97 mL/min (ref >60.00)
Glucose, Bld: 114 mg/dL — ABNORMAL HIGH (ref 70–99)
Potassium: 4.1 meq/L (ref 3.5–5.1)
Sodium: 143 meq/L (ref 135–145)

## 2024-03-07 NOTE — Progress Notes (Signed)
 Acute Office Visit  Subjective:     Patient ID: Robert Gibbs, male    DOB: July 10, 1950, 73 y.o.   MRN: [id number]  Chief Complaint  Patient presents with   Skin Problem    HPI Patient is in today for skin check.  Discussed the use of AI scribe software for clinical note transcription with the patient, who gave verbal consent to proceed.  History of Present Illness Robert Gibbs is a 73 year old male who presents for follow-up of leg swelling and redness.  His legs are less red and feel better compared to the last visit. He completed 3 days of lasix , resulting in a loss of six pounds of water weight. He is completing his course of antibiotics today.  He experiences peripheral neuropathy, which requires him to prop his feet up during the day. The swelling in his legs has improved but is still somewhat present, particularly in the ankles, which he describes as his baseline due to poor circulation. He has been monitoring his sodium intake.  He sleeps in a chair and finds it difficult to elevate his feet above heart level. He does not use a bed for sleeping and has difficulty raising his feet high enough even with a pillow.      Wt Readings from Last 3 Encounters:  03/07/24 287 lb (130.2 kg)  03/03/24 293 lb (132.9 kg)  02/16/24 290 lb (131.5 kg)        ROS All review of systems negative except what is listed in the HPI      Objective:    BP (!) 147/56   Pulse 96   Ht 5' 10 (1.778 m)   Wt 287 lb (130.2 kg)   SpO2 97%   BMI 41.18 kg/m    Physical Exam Vitals reviewed.  Constitutional:      General: He is not in acute distress.    Appearance: Normal appearance. He is obese.  Cardiovascular:     Rate and Rhythm: Normal rate and regular rhythm.  Pulmonary:     Effort: Pulmonary effort is normal.     Breath sounds: Normal breath sounds. No wheezing, rhonchi or rales.  Musculoskeletal:     Right lower leg: Edema present.     Left lower leg: Edema  present.     Comments: Significant improvement from last visit  Neurological:     Mental Status: He is alert and oriented to person, place, and time.  Psychiatric:        Mood and Affect: Mood normal.        Behavior: Behavior normal.        Thought Content: Thought content normal.        Judgment: Judgment normal.           No results found for any visits on 03/07/24.      Assessment & Plan:   Problem List Items Addressed This Visit   None Visit Diagnoses       Bilateral lower extremity edema    -  Primary   Relevant Orders   Basic metabolic panel with GFR      Assessment & Plan Bilateral lower extremity edema Edema improved with diuretics, six-pound water weight loss. Residual ankle swelling is baseline. Improvement due to diuretics and leg elevation. - Order blood work for kidney function and potassium levels. - Advise wearing knee-high compression socks during the day, now that infection resolved. - Instruct to elevate feet frequently during the day. -  Instruct to weigh himself daily at the same time and record the weight. - Advise to monitor for rapid weight gain and report if it occurs. - Encourage continued sodium restriction.  Resolved cellulitis of lower extremity Cellulitis resolved with antibiotics. Redness and swelling decreased, infection no longer a concern. - Complete the current course of antibiotics.       No orders of the defined types were placed in this encounter.   Return for (keep PCP appt end of this month).  Waddell KATHEE Mon, NP

## 2024-03-08 ENCOUNTER — Ambulatory Visit: Payer: Self-pay | Admitting: Family Medicine

## 2024-03-14 ENCOUNTER — Telehealth: Payer: Self-pay

## 2024-03-14 DIAGNOSIS — L03119 Cellulitis of unspecified part of limb: Secondary | ICD-10-CM

## 2024-03-14 NOTE — Telephone Encounter (Signed)
 Please advise since you were last to see him.

## 2024-03-14 NOTE — Telephone Encounter (Signed)
 Copied from CRM [id number]. Topic: Clinical - Request for Lab/Test Order >> Mar 11, 2024  4:54 PM Drema MATSU wrote: Reason for CRM: Patient is inquiring about getting a white blood cell count blood test done since he had swelling and redness in his legs.He also has questions regarding how many antibiotics he received.  He is requesting a callback.

## 2024-03-21 DIAGNOSIS — F419 Anxiety disorder, unspecified: Secondary | ICD-10-CM | POA: Insufficient documentation

## 2024-03-22 ENCOUNTER — Encounter: Payer: Self-pay | Admitting: Internal Medicine

## 2024-03-22 ENCOUNTER — Other Ambulatory Visit (HOSPITAL_BASED_OUTPATIENT_CLINIC_OR_DEPARTMENT_OTHER): Payer: Self-pay

## 2024-03-22 ENCOUNTER — Ambulatory Visit: Admitting: Internal Medicine

## 2024-03-22 VITALS — BP 132/80 | HR 78 | Temp 98.3°F | Resp 18 | Ht 70.0 in | Wt 283.0 lb

## 2024-03-22 DIAGNOSIS — E538 Deficiency of other specified B group vitamins: Secondary | ICD-10-CM

## 2024-03-22 DIAGNOSIS — F419 Anxiety disorder, unspecified: Secondary | ICD-10-CM

## 2024-03-22 DIAGNOSIS — N401 Enlarged prostate with lower urinary tract symptoms: Secondary | ICD-10-CM

## 2024-03-22 DIAGNOSIS — E785 Hyperlipidemia, unspecified: Secondary | ICD-10-CM

## 2024-03-22 DIAGNOSIS — N138 Other obstructive and reflux uropathy: Secondary | ICD-10-CM | POA: Diagnosis not present

## 2024-03-22 DIAGNOSIS — Z23 Encounter for immunization: Secondary | ICD-10-CM

## 2024-03-22 DIAGNOSIS — R739 Hyperglycemia, unspecified: Secondary | ICD-10-CM

## 2024-03-22 DIAGNOSIS — G4700 Insomnia, unspecified: Secondary | ICD-10-CM | POA: Diagnosis not present

## 2024-03-22 DIAGNOSIS — I10 Essential (primary) hypertension: Secondary | ICD-10-CM

## 2024-03-22 MED ORDER — TETANUS-DIPHTH-ACELL PERTUSSIS 5-2.5-18.5 LF-MCG/0.5 IM SUSY
0.5000 mL | PREFILLED_SYRINGE | Freq: Once | INTRAMUSCULAR | 0 refills | Status: AC
  Filled 2024-03-22: qty 0.5, 1d supply, fill #0

## 2024-03-22 MED ORDER — VITAMIN D3 50 MCG (2000 UT) PO CAPS: 2000.0000 [IU] | ORAL_CAPSULE | Freq: Every day | ORAL | Status: AC

## 2024-03-22 MED ORDER — COMIRNATY 30 MCG/0.3ML IM SUSY
0.3000 mL | PREFILLED_SYRINGE | Freq: Once | INTRAMUSCULAR | 0 refills | Status: AC
  Filled 2024-03-22 (×2): qty 0.3, 1d supply, fill #0

## 2024-03-22 NOTE — Patient Instructions (Addendum)
 GO TO THE LAB :  Get the blood work    Then, go to the front desk for the checkout Please make an appointment in 4 to 5 months to see me Also make an appointment for a nurse visit in 3 weeks for your B12 shot.  Vaccines I recommend that the pharmacy COVID booster Tetanus shot  Check the  blood pressure regularly Blood pressure goal:  between 110/65 and  135/85. If it is consistently higher or lower, let me know   Please read more detailed instructions below   EDEMA OF LOWER EXTREMITIES: You have swelling in your feet and legs that affects your mobility. -Reduce salt intake, especially avoid adding salt to food and be cautious with high-salt foods like canned soups and restaurant meals. -Wear compression stockings, ensuring they are not too tight, and put them on in the morning when your legs are less swollen. -Elevate your legs when you notice swelling, using multiple pillows if necessary.  HYPERTENSION: Your blood pressure readings are averaging 134-136/82 mmHg. -Continue taking lisinopril  and metoprolol  for blood pressure management.  HYPERLIPIDEMIA: Your cholesterol levels are being managed with Zetia  and rosuvastatin . -Continue taking Zetia  and rosuvastatin  for cholesterol management. -We are checking your cholesterol today  HYPERGLYCEMIA: We are monitoring your blood sugar levels with an A1c test. -Get an A1c test. -Maintain a healthy diet to manage your blood sugar levels.  FUNGAL INFECTION OF RIGHT WRIST: The fungal infection on your wrist has  improved significantly with current treatment. - Okay to stop using the topical creams for the fungal infection. -Consider seeing a dermatologist if the problem persists.  GENERAL HEALTH MAINTENANCE: We discussed your overall health and provided some recommendations. -You received a flu shot today. -Get a COVID booster and Tdap at the pharmacy. -Continue taking vitamin D  2000 units daily. -Stop taking calcium  supplements.

## 2024-03-22 NOTE — Progress Notes (Unsigned)
 Subjective:    Patient ID: Robert Gibbs, male    DOB: 12/24/1950, 73 y.o.   MRN: [id number]  DOS:  03/22/2024 Type of visit - description: Follow-up  Discussed the use of AI scribe software for clinical note transcription with the patient, who gave verbal consent to proceed.  History of Present Illness  Chronic medical problems addressed. Recently seen with swelling.  Improved Good ambulatory BPs. 134-136/82 mmHg. Overall feeling well    Review of Systems See above   Past Medical History:  Diagnosis Date   Anxiety and depression    Arthritis    Barrett esophagus    BPH (benign prostatic hyperplasia)    CTS (carpal tunnel syndrome)    h/o   Edema of both legs    legs , ankles and feet   Erectile dysfunction    GERD (gastroesophageal reflux disease) 03/18/2012   H/O hiatal hernia    HTN (hypertension)    Hyperlipidemia    Microscopic hematuria    Neuropathy    OSA (obstructive sleep apnea) 03/18/2012   on BiPap   Prostatitis    PVD (peripheral vascular disease)    Spinal stenosis 06-2012   saw neuro w/ neuropathy, dx w/ spinal stenosis   Varicose veins    Vitamin D  deficiency    Wears glasses     Past Surgical History:  Procedure Laterality Date   ARCH BAR REMOVAL     BACK SURGERY  2002   L4-L5   ELBOW SURGERY  1992   for tennis elbow-rt   ESOPHAGOGASTRODUODENOSCOPY  04/2015   w/ biopsy, short tongue of Barrett's without obvious nodularity, mass or esophagitis seen   EYE SURGERY Right    cataract with lens implant   FOOT SURGERY     R, for plantar fasciitis   HERNIA REPAIR     x 2 as a child-ing    KNEE ARTHROSCOPY WITH MENISCAL REPAIR Bilateral 05/04/2013   Procedure: BILATERAL KNEE ARTHROSCOPY WITH POSSIBLE CHONDROPLASTY AND MENISCECTOMY ;  Surgeon: Dempsey JINNY Sensor, MD;  Location: [facility];  Service: Orthopedics;  Laterality: Bilateral;  Right chrondroplasty, debridement, , partial medial menisectomy, removal of loose bodies.     LIGATION / DIVISION SAPHENOUS VEIN     both legs   PROSTATE BIOPSY     2010, (-)   TONSILLECTOMY     TOTAL KNEE ARTHROPLASTY Right 11/07/2013   Procedure: TOTAL KNEE ARTHROPLASTY;  Surgeon: Dempsey JINNY Sensor, MD;  Location: [facility] OR;  Service: Orthopedics;  Laterality: Right;   TOTAL KNEE ARTHROPLASTY Left 03/01/2014   Procedure: TOTAL KNEE ARTHROPLASTY;  Surgeon: Dempsey JINNY Sensor, MD;  Location: [facility] OR;  Service: Orthopedics;  Laterality: Left;   UPPER GASTROINTESTINAL ENDOSCOPY     UVULOPALATOPHARYNGOPLASTY (UPPP)/TONSILLECTOMY/SEPTOPLASTY  1990    Current Outpatient Medications  Medication Instructions   aspirin  81 mg, Daily   buPROPion  (WELLBUTRIN  XL) 300 mg, Oral, Daily   Calcium  Carbonate-Vit D-Min (CALCIUM  1200) 1200-1000 MG-UNIT CHEW 1 tablet, Daily   Coenzyme Q10 200 mg, Daily   escitalopram  (LEXAPRO ) 20 MG tablet TAKE 1 TABLET(20 MG) BY MOUTH DAILY   ezetimibe  (ZETIA ) 10 MG tablet TAKE 1 TABLET(10 MG) BY MOUTH DAILY   gabapentin  (NEURONTIN ) 800 mg, Oral, 2 times daily   ketoconazole  (NIZORAL ) 2 % cream 1 Application, Topical, Daily   lisinopril  (ZESTRIL ) 40 MG tablet TAKE 1 TABLET(40 MG) BY MOUTH DAILY   magnesium  oxide (MAG-OX) 400 mg, Daily   metoprolol  succinate (TOPROL -XL) 100 MG 24 hr tablet  TAKE 2 TABLETS(200 MG) BY MOUTH DAILY WITH OR IMMEDIATELY FOLLOWING A MEAL   Multiple Vitamins-Minerals (MULTIVITAMIN WITH MINERALS) tablet 1 tablet, Daily   Naftifine  HCl (NAFTIN ) 2 % GEL Apply twice a day for 2 weeks   omeprazole  (PRILOSEC) 40 mg, Oral, Daily before breakfast   rosuvastatin  (CRESTOR ) 10 mg, Oral, Daily   solifenacin  (VESICARE ) 10 mg, Oral, Daily   tadalafil  (CIALIS ) 20 mg, Oral, Daily PRN   vitamin C 1,000 mg, Daily   zolpidem  (AMBIEN ) 5-10 mg, Oral, At bedtime PRN       Objective:   Physical Exam BP 132/80   Pulse 78   Temp 98.3 F (36.8 C) (Oral)   Resp 18   Ht 5' 10 (1.778 m)   Wt 283 lb (128.4 kg)   SpO2 96%   BMI 40.61 kg/m  General:   Well developed,  NAD, BMI noted. HEENT:  Normocephalic . Face symmetric, atraumatic Lungs:  CTA B Normal respiratory effort, no intercostal retractions, no accessory muscle use. Heart: RRR,  no murmur.  Lower extremities: See picture.  Trace pitting edema.  Minimal redness with no warmness or tenderness to palpation Skin: Not pale. Not jaundice Neurologic:  alert & oriented X3.  Speech normal, gait appropriate for age and unassisted Psych--  Cognition and judgment appear intact.  Cooperative with normal attention span and concentration.  Behavior appropriate. No anxious or depressed appearing.      Assessment   Assessment Hyperglycemia HTN Hyperlipidemia: s/e  with simvastatin  Chronic lower extremity edema w/ some pretibial redness  (ER 2-17, US  neg DVT, BNP wnl) anxiety depression, insomnia COPD: Per CT 03-2016 Coronary calcifications per CTA. Morbid obesity GI: GERD, Barrett's  esophagus OSA, BiPAP, per pulmonary, not using frequently as of 07-2021  GU: Dr Roseann  --LUTS , failed a # of medications --BPH, elevated PSA:  (-)  biopsy 2009  --Hematuria, CT, cystoscopy 2009 negative --Prostatitis, admitted 03-2020 Vitamin D  deficiency  MSK: -- Spinal stenosis : saw neurology w/ sx of paresthesias feet-ankle, sx felt to be d/t spinal stenosis not neuropathy, see OV note 08-07-2012 , used to see pain med (Dr Ernesto) Diastasis recti   Assessment & Plan Edema of lower extremities Recent episode of significant leg swelling managed with Lasix  for few days and antibiotics. Swelling improved with previous treatment. - Advise reducing salt intake, especially avoiding adding salt to food and being cautious with high-salt foods like canned soups and restaurant meals. - Recommend wearing compression stockings, ensuring he is not too tight, and putting them on in the morning when legs are less swollen. - Advise elevating legs when swelling is noticed, using multiple pillows if  necessary. Hypertension Blood pressure readings averaging 134-136/82 mmHg. Current management with lisinopril  and metoprolol .  No change recent BMP okay. Hyperlipidemia Currently managed with Zetia  and rosuvastatin .  No change.  Check FLP  Hyperglycemia Monitoring with A1c test. Encouraged to maintain a healthy diet to manage blood sugar levels. Encourage adherence to a healthy diet. B12 deficiency: On B12 twelfths regularly, feels more energetic.  Folic acid  was okay Vitamin D : Recommend 2000 units daily.  No need to take calcium  supplement at this point. Anxiety insomnia depression: Currently controlled. Fungal infection of right wrist Fungal infection has  improved. - Discontinue use of topical creams for fungal infection. - Consider referral to a dermatologist if the problem persists. BPH: Urology visit 02/16/2024: They increased solifenacin  to 10 mg daily, they recommended a cystoscopy General Health Maintenance Received flu shot today.  -  Recommend COVID booster and Tdap at the pharmacy. - Advise taking vitamin D  2000 units daily. - Discontinue calcium  supplementation RTC 4 to 5 months

## 2024-03-23 DIAGNOSIS — E538 Deficiency of other specified B group vitamins: Secondary | ICD-10-CM | POA: Insufficient documentation

## 2024-03-23 NOTE — Assessment & Plan Note (Signed)
 Edema of lower extremities Recent episode of significant leg swelling managed with Lasix  for few days and antibiotics. Swelling improved with previous treatment. - Advise reducing salt intake, especially avoiding adding salt to food and being cautious with high-salt foods like canned soups and restaurant meals. - Recommend wearing compression stockings, ensuring he is not too tight, and putting them on in the morning when legs are less swollen. - Advise elevating legs when swelling is noticed, using multiple pillows if necessary. Hypertension Blood pressure readings averaging 134-136/82 mmHg. Current management with lisinopril  and metoprolol .  No change recent BMP okay. Hyperlipidemia Currently managed with Zetia  and rosuvastatin .  No change.  Check FLP  Hyperglycemia Monitoring with A1c test. Encouraged to maintain a healthy diet to manage blood sugar levels. Encourage adherence to a healthy diet. B12 deficiency: On B12 twelfths regularly, feels more energetic.  Folic acid  was okay Vitamin D : Recommend 2000 units daily.  No need to take calcium  supplement at this point. Anxiety insomnia depression: Currently controlled. Fungal infection of right wrist Fungal infection has  improved. - Discontinue use of topical creams for fungal infection. - Consider referral to a dermatologist if the problem persists. BPH: Urology visit 02/16/2024: They increased solifenacin  to 10 mg daily, they recommended a cystoscopy General Health Maintenance Received flu shot today.  - Recommend COVID booster and Tdap at the pharmacy. - Advise taking vitamin D  2000 units daily. - Discontinue calcium  supplementation RTC 4 to 5 months

## 2024-03-24 ENCOUNTER — Other Ambulatory Visit

## 2024-03-24 DIAGNOSIS — E785 Hyperlipidemia, unspecified: Secondary | ICD-10-CM

## 2024-03-24 DIAGNOSIS — L03119 Cellulitis of unspecified part of limb: Secondary | ICD-10-CM

## 2024-03-24 DIAGNOSIS — R739 Hyperglycemia, unspecified: Secondary | ICD-10-CM | POA: Diagnosis not present

## 2024-03-25 LAB — LIPID PANEL
Cholesterol: 101 mg/dL (ref 0–200)
HDL: 34.7 mg/dL — ABNORMAL LOW (ref >39.00)
LDL Cholesterol: 52 mg/dL (ref 0–99)
NonHDL: 66.76
Total CHOL/HDL Ratio: 3
Triglycerides: 72 mg/dL (ref 0.0–149.0)
VLDL: 14.4 mg/dL (ref 0.0–40.0)

## 2024-03-25 LAB — CBC WITH DIFFERENTIAL/PLATELET
Basophils Absolute: 0.1 K/uL (ref 0.0–0.1)
Basophils Relative: 1.3 % (ref 0.0–3.0)
Eosinophils Absolute: 0.2 K/uL (ref 0.0–0.7)
Eosinophils Relative: 4.5 % (ref 0.0–5.0)
HCT: 44.7 % (ref 39.0–52.0)
Hemoglobin: 15.4 g/dL (ref 13.0–17.0)
Lymphocytes Relative: 28.9 % (ref 12.0–46.0)
Lymphs Abs: 1.6 K/uL (ref 0.7–4.0)
MCHC: 34.5 g/dL (ref 30.0–36.0)
MCV: 97.2 fl (ref 78.0–100.0)
Monocytes Absolute: 0.5 K/uL (ref 0.1–1.0)
Monocytes Relative: 9.1 % (ref 3.0–12.0)
Neutro Abs: 3.1 K/uL (ref 1.4–7.7)
Neutrophils Relative %: 56.2 % (ref 43.0–77.0)
Platelets: 201 K/uL (ref 150.0–400.0)
RBC: 4.59 Mil/uL (ref 4.22–5.81)
RDW: 13.6 % (ref 11.5–15.5)
WBC: 5.5 K/uL (ref 4.0–10.5)

## 2024-03-25 LAB — AST: AST: 11 U/L (ref 0–37)

## 2024-03-25 LAB — HEMOGLOBIN A1C: Hgb A1c MFr Bld: 5.3 % (ref 4.6–6.5)

## 2024-03-25 LAB — ALT: ALT: 11 U/L (ref 0–53)

## 2024-03-26 ENCOUNTER — Ambulatory Visit: Payer: Self-pay | Admitting: Internal Medicine

## 2024-03-28 ENCOUNTER — Ambulatory Visit: Payer: Self-pay | Admitting: Family Medicine

## 2024-03-30 ENCOUNTER — Encounter: Payer: Self-pay | Admitting: Urology

## 2024-03-30 ENCOUNTER — Ambulatory Visit (INDEPENDENT_AMBULATORY_CARE_PROVIDER_SITE_OTHER): Admitting: Urology

## 2024-03-30 VITALS — BP 163/106 | HR 72 | Ht 70.0 in | Wt 275.0 lb

## 2024-03-30 DIAGNOSIS — N529 Male erectile dysfunction, unspecified: Secondary | ICD-10-CM

## 2024-03-30 DIAGNOSIS — N138 Other obstructive and reflux uropathy: Secondary | ICD-10-CM | POA: Diagnosis not present

## 2024-03-30 DIAGNOSIS — R972 Elevated prostate specific antigen [PSA]: Secondary | ICD-10-CM

## 2024-03-30 DIAGNOSIS — R3129 Other microscopic hematuria: Secondary | ICD-10-CM | POA: Diagnosis not present

## 2024-03-30 DIAGNOSIS — Z87440 Personal history of urinary (tract) infections: Secondary | ICD-10-CM | POA: Diagnosis not present

## 2024-03-30 DIAGNOSIS — N401 Enlarged prostate with lower urinary tract symptoms: Secondary | ICD-10-CM

## 2024-03-30 LAB — URINALYSIS, ROUTINE W REFLEX MICROSCOPIC
Glucose, UA: NEGATIVE
Leukocytes,UA: NEGATIVE
Nitrite, UA: NEGATIVE
Specific Gravity, UA: 1.02 (ref 1.005–1.030)
Urobilinogen, Ur: 0.2 mg/dL (ref 0.2–1.0)
pH, UA: 5.5 (ref 5.0–7.5)

## 2024-03-30 LAB — MICROSCOPIC EXAMINATION

## 2024-03-30 MED ORDER — CIPROFLOXACIN HCL 500 MG PO TABS
500.0000 mg | ORAL_TABLET | Freq: Once | ORAL | Status: AC
  Administered 2024-03-30: 500 mg via ORAL

## 2024-03-30 NOTE — Progress Notes (Signed)
 Assessment: 1. BPH with obstruction/lower urinary tract symptoms   2. Microscopic hematuria; prior evaluation 2009   3. Elevated PSA; negative biopsy 2009   4. Organic impotence   5. History of UTI    Plan: I discussed the findings on cystoscopy with the patient.  He does have evidence of lateral lobe enlargement of the prostate. I discussed possible causes of his lower urinary tract symptoms including BPH.  I discussed treatment options including minimally invasive surgery, and surgical management with TURP, HoLEP. Recommend that he continue the trial of solifenacin  for another 3-4 weeks. He will contact me with an update on his symptoms. If no improvement, consider evaluation with a prostate ultrasound for volume measurement in order to better determine the ideal management for his BPH.  Chief Complaint:  Chief Complaint  Patient presents with   Benign Prostatic Hypertrophy   Cysto    History of Present Illness:  Robert Gibbs is a 73 y.o. male who is seen for continued evaluation of BPH with LUTS, microscopic hematuria, and elevated PSA. He has a history of UTI's. Urine culture results: 2/22 50-100K Enterococcus 7/23 10K Enterococcus 12/23 >100K Enterococcus  At his visit in March 2024, he continued to have urinary frequency, urgency, and nocturia 3-4 times.  He also had associated urge incontinence.  No dysuria or gross hematuria.  He reported voiding with a good stream.  He was not taking any medications for his urinary symptoms.  He does not remember if he took the Myrbetriq and if it improved his symptoms. IPSS = 26.  PVR = 37 mL Urine culture from 09/05/2022 grew 10-20 5K Enterococcus.  Treated with Cipro  x 7 days. He was also given a trial of Gemtesa .  He does not remember if this improved his symptoms. He did not return for follow-up as scheduled.  He was seen by his PCP for increased frequency, hesitancy, and gross hematuria. Urine culture grew >100 K E. coli.   He was given an injection of Rocephin  and started on Cipro . PSA drawn at the time of UTI was 35.57. At his visit in 73/24, he reported symptoms of frequency, urgency, incontinence, and gross hematuria.  No fevers or chills.  No flank pain. He had not returned for follow-up since that time.  At his visit in June 2025, he continued with lower urinary tract symptoms.  He had not been taking the Gemtesa  as he ran out of samples following his last visit 6 months prior.  He continued to have symptoms of frequency, urgency, and incontinence as well as a decrease stream.  No dysuria or gross hematuria. IPSS = 23/5. No UTI symptoms. PSA from 6/25: 8.9  At his visit in August 2025, he continued on solifenacin  5 mg daily for his lower urinary tract symptoms.  He felt like the medicine only slightly improved his symptoms.  He continued to have daytime frequency voiding 6 times per day, nocturia x 3, urgency, and decreased stream.  No dysuria or gross hematuria. IPSS = 21/5. PVR = 72 ml He also reported difficulty achieving and maintaining his erections.  He had not tried any medical therapy to date.  No decrease in his libido.  No pain or curvature associated with erections.  PHI 8/25: PSA 6.7, free PSA 25.2%, PHI 33.6 suggesting a 16.8% probability of prostate cancer. His dose of solifenacin  was increased to 10 mg daily.  He presents today for further evaluation with cystoscopy. He has not seen a significant change in his  lower urinary tract symptoms with the increased dose of solifenacin  although he just started this dose 1 week ago.  He can continues with symptoms of incomplete emptying, frequency, urgency, decreased stream, and nocturia x 3.  No dysuria or gross hematuria.  He continues to have some occasional incontinence. IPSS = 19/5.  Urologic History:  He has a history of elevated PSA. He had a previous negative prostate biopsy in June 2009.  PSA  results: 6/09 4.7 4/13 2.36 10/15 2.92 12/16 3.63 12/16 40.33 5/19 25.47 2/22 7.54 12/23 9.95   He has a history of LUTS. He has tried various alpha blockers which did not help and were too expensive including Flomax , Rapaflo, and Uroxatral. Daily Cialis  5 mg worked the best for his LUTS but insurance would not approve this. He tried alfuzosin which did not improve his symptoms. He reported weak FOS, frequency, urgency, incomplete emptying, and an increase in his frequency and urgency. He had intermittent episodes of urge incontinence.  He resumed Cialis  5 mg daily after his visit in 12/16 and his LUTS improved. He was seen by Dr. Amon for dysuria, urgency, and strong urine odor. He was diagnosed with prostatitis and UTI. Urine culture grew E. Coli.  PSA was 40. He was initially treated with Bactrim  x 4 weeks, then changed to Cipro  x 2 weeks.  He was diagnosed with prostatitis and UTI in 5/19. Urine culture grew E. Coli. His symptoms resolved with antibiotics.  He was not taking daily tadalafil . AUA score = 31. He was diagnosed with prostatitis and treated with Cipro  for 3 weeks. He noted some slight improvement in his symptoms. He continued to have urinary frequency, urgency, nocturia x 5, sensation of incomplete emptying and post void dribbling. No dysuria or gross hematuria. AUA score = 30.  He underwent microscopic hematuria evaluation with CT and cysto in May 2009. He was found to have a right renal cyst. Hematuria profile from 12/16 was negative for malignancy.   At his visit in 12/21, he noted worsening of his urinary symptoms within the prior 1-2 months. He was admitted to the hospital in October 2021 with sepsis. He was also diagnosed with prostatitis. Urinalysis showed 57 RBCs, 10 WBCs. Urine culture grew <10K colonies. He reported increased frequency, urgency, nocturia x4, and urge incontinence. His symptoms persisted following treatment. No dysuria or gross hematuria. He continued to  have frequency, nocturia x4, urgency, and urge incontinence. AUA score = 31. He was not on any medical therapy for his urinary symptoms at that time. He was given a trial of Myrbetriq 50 mg daily.  Portions of the above documentation were copied from a prior visit for review purposes only.    Past Medical History:  Past Medical History:  Diagnosis Date   Anxiety and depression    Arthritis    Barrett esophagus    BPH (benign prostatic hyperplasia)    CTS (carpal tunnel syndrome)    h/o   Edema of both legs    legs , ankles and feet   Erectile dysfunction    GERD (gastroesophageal reflux disease) 03/18/2012   H/O hiatal hernia    HTN (hypertension)    Hyperlipidemia    Microscopic hematuria    Neuropathy    OSA (obstructive sleep apnea) 03/18/2012   on BiPap   Prostatitis    PVD (peripheral vascular disease)    Spinal stenosis 06-2012   saw neuro w/ neuropathy, dx w/ spinal stenosis   Varicose veins  Vitamin D  deficiency    Wears glasses     Past Surgical History:  Past Surgical History:  Procedure Laterality Date   ARCH BAR REMOVAL     BACK SURGERY  2002   L4-L5   ELBOW SURGERY  1992   for tennis elbow-rt   ESOPHAGOGASTRODUODENOSCOPY  04/2015   w/ biopsy, short tongue of Barrett's without obvious nodularity, mass or esophagitis seen   EYE SURGERY Right    cataract with lens implant   FOOT SURGERY     R, for plantar fasciitis   HERNIA REPAIR     x 2 as a child-ing    KNEE ARTHROSCOPY WITH MENISCAL REPAIR Bilateral 05/04/2013   Procedure: BILATERAL KNEE ARTHROSCOPY WITH POSSIBLE CHONDROPLASTY AND MENISCECTOMY ;  Surgeon: Dempsey JINNY Sensor, MD;  Location: [facility];  Service: Orthopedics;  Laterality: Bilateral;  Right chrondroplasty, debridement, , partial medial menisectomy, removal of loose bodies.    LIGATION / DIVISION SAPHENOUS VEIN     both legs   PROSTATE BIOPSY     2010, (-)   TONSILLECTOMY     TOTAL KNEE ARTHROPLASTY Right 11/07/2013    Procedure: TOTAL KNEE ARTHROPLASTY;  Surgeon: Dempsey JINNY Sensor, MD;  Location: [facility];  Service: Orthopedics;  Laterality: Right;   TOTAL KNEE ARTHROPLASTY Left 03/01/2014   Procedure: TOTAL KNEE ARTHROPLASTY;  Surgeon: Dempsey JINNY Sensor, MD;  Location: [facility] OR;  Service: Orthopedics;  Laterality: Left;   UPPER GASTROINTESTINAL ENDOSCOPY     UVULOPALATOPHARYNGOPLASTY (UPPP)/TONSILLECTOMY/SEPTOPLASTY  1990    Allergies:  Allergies  Allergen Reactions   Codeine Itching   Nsaids     Acid Reflux   Simvastatin     Mimics heart attack   Tolmetin     Acid Reflux    Family History:  Family History  Problem Relation Age of Onset   CAD Father        ?   Alzheimer's disease Mother    Stroke Brother    Colon cancer Neg Hx    Prostate cancer Neg Hx    Diabetes Neg Hx    Cancer Neg Hx     Social History:  Social History   Tobacco Use   Smoking status: Former    Current packs/day: 0.00    Average packs/day: 1 pack/day for 40.0 years (40.0 ttl pk-yrs)    Types: Cigarettes    Start date: 68    Quit date: 06/29/2008    Years since quitting: 15.7   Smokeless tobacco: Never   Tobacco comments:    quit 2010  Substance Use Topics   Alcohol use: Yes    Alcohol/week: 3.0 standard drinks of alcohol    Types: 3 Standard drinks or equivalent per week    Comment: 2-3 large drinks, Vodka, 1-2 times a week   Drug use: No    ROS: Constitutional:  Negative for fever, chills, weight loss CV: Negative for chest pain, previous MI, hypertension Respiratory:  Negative for shortness of breath, wheezing, sleep apnea, frequent cough GI:  Negative for nausea, vomiting, bloody stool, GERD  Physical exam: BP (!) 163/106   Pulse 72   Ht 5' 10 (1.778 m)   Wt 275 lb (124.7 kg)   BMI 39.46 kg/m  GENERAL APPEARANCE:  Well appearing, well developed, well nourished, NAD HEENT:  Atraumatic, normocephalic, oropharynx clear NECK:  Supple without lymphadenopathy or thyromegaly ABDOMEN:  Soft, non-tender, no  masses EXTREMITIES:  Moves all extremities well, without clubbing, cyanosis, or edema NEUROLOGIC:  Alert and  oriented x 3, normal gait, CN II-XII grossly intact MENTAL STATUS:  appropriate BACK:  Non-tender to palpation, No CVAT SKIN:  Warm, dry, and intact  Results: U/A: 0-5 WBCs, 0-2 RBCs  Procedure:  Flexible Cystourethroscopy  Pre-operative Diagnosis: Lower urinary tract symptoms  Post-operative Diagnosis: Lower urinary tract symptoms  Anesthesia:  local with lidocaine  jelly  Surgical Narrative:  After appropriate informed consent was obtained, the patient was prepped and draped in the usual sterile fashion in the supine position.  The patient was correctly identified and the proper procedure delineated prior to proceeding.  Sterile lidocaine  gel was instilled in the urethra. The flexible cystoscope was introduced without difficulty.  Findings:  Anterior urethra: narrow meatus, dilated with sounds to 42F  Posterior urethra: Lateral lobe hypertrophy; no median lobe  Bladder: 1+ trabeculations; no mucosal lesions  Ureteral orifices: normal  Additional findings:  none  Saline bladder wash for cytology was not performed.    The cystoscope was then removed.  The patient tolerated the procedure well.

## 2024-04-05 ENCOUNTER — Ambulatory Visit (INDEPENDENT_AMBULATORY_CARE_PROVIDER_SITE_OTHER): Admitting: *Deleted

## 2024-04-05 DIAGNOSIS — E538 Deficiency of other specified B group vitamins: Secondary | ICD-10-CM | POA: Diagnosis not present

## 2024-04-05 MED ORDER — CYANOCOBALAMIN 1000 MCG/ML IJ SOLN
1000.0000 ug | Freq: Once | INTRAMUSCULAR | Status: AC
  Administered 2024-04-05: 1000 ug via INTRAMUSCULAR

## 2024-04-05 NOTE — Progress Notes (Addendum)
 Pt in for monthly B12 injection per PCP. Next injection scheduled on 05/06/24.

## 2024-04-08 ENCOUNTER — Telehealth: Payer: Self-pay

## 2024-04-08 NOTE — Telephone Encounter (Signed)
 Spoke w/ Pt- reviewed vaccines, he is currently up to date. Pt verbalized understanding.

## 2024-04-08 NOTE — Telephone Encounter (Signed)
 Copied from CRM #[id number]. Topic: Clinical - Medication Question >> Apr 08, 2024  1:59 PM Robert Gibbs wrote: Reason for CRM: Pt is requesting a prescription for the covid vaccine be sent to [facility] on [address]. Pls notify pt when it has been sent. Pt also wants to know if he is due for a shingles vaccine booster.

## 2024-04-23 ENCOUNTER — Other Ambulatory Visit: Payer: Self-pay | Admitting: Internal Medicine

## 2024-04-28 ENCOUNTER — Ambulatory Visit (HOSPITAL_BASED_OUTPATIENT_CLINIC_OR_DEPARTMENT_OTHER)
Admission: RE | Admit: 2024-04-28 | Discharge: 2024-04-28 | Disposition: A | Discharge status: Home / Self Care | Source: Ambulatory Visit | Attending: Acute Care | Admitting: Acute Care

## 2024-04-28 DIAGNOSIS — Z87891 Personal history of nicotine dependence: Secondary | ICD-10-CM | POA: Diagnosis not present

## 2024-04-28 DIAGNOSIS — Z122 Encounter for screening for malignant neoplasm of respiratory organs: Secondary | ICD-10-CM | POA: Insufficient documentation

## 2024-04-28 DIAGNOSIS — F17210 Nicotine dependence, cigarettes, uncomplicated: Secondary | ICD-10-CM | POA: Diagnosis not present

## 2024-05-03 ENCOUNTER — Telehealth: Payer: Self-pay

## 2024-05-03 ENCOUNTER — Ambulatory Visit: Payer: Self-pay

## 2024-05-03 NOTE — Telephone Encounter (Signed)
 FYI Only or Action Required?: FYI only for provider: medication information given.  Patient was last seen in primary care on 03/22/2024 by Amon Aloysius BRAVO, MD.  Called Nurse Triage reporting Advice Only.  Triage Disposition: Information or Advice Only Call  Patient/caregiver understands and will follow disposition?:   Reason for Disposition  Health information question, no triage required and triager able to answer question  Answer Assessment - Initial Assessment Questions 1. REASON FOR CALL: What is the main reason for your call? or How can I best help you?     Patient calling for clarification on OTC Vitamin D3 pills. Patient given instructions to looks for 2000 international units  (50 MCG). Verbalized understanding 2. SYMPTOMS : Do you have any symptoms?      no 3. OTHER QUESTIONS: Do you have any other questions?     no  Protocols used: Information Only Call - No Triage-A-AH

## 2024-05-03 NOTE — Telephone Encounter (Signed)
 Noted

## 2024-05-03 NOTE — Telephone Encounter (Signed)
 Copied from CRM [id number]. Topic: General - Other >> May 03, 2024  3:39 PM China J wrote: Reason for CRM: The patient would like to know when he had his last COVID booster.  Please call: [phone]

## 2024-05-03 NOTE — Telephone Encounter (Signed)
 LMOM informing Pt per our records his last covid vaccine was 03/22/2024.

## 2024-05-03 NOTE — Telephone Encounter (Signed)
 1st attempt to reach patient, left message with office call back number  Copied from CRM #[id number]. Topic: Clinical - Medication Question >> May 03, 2024  3:43 PM China J wrote: Reason for CRM: The patient wanted to ask what dosing he should look for when he goes to the store to buy over the counter Vitamin D3. He said on the bottle he was prescribed it says 50 MCG (2000 UT) and it confused him a bit.

## 2024-05-04 ENCOUNTER — Other Ambulatory Visit: Payer: Self-pay

## 2024-05-06 ENCOUNTER — Ambulatory Visit (INDEPENDENT_AMBULATORY_CARE_PROVIDER_SITE_OTHER)

## 2024-05-06 DIAGNOSIS — E538 Deficiency of other specified B group vitamins: Secondary | ICD-10-CM

## 2024-05-06 MED ORDER — CYANOCOBALAMIN 1000 MCG/ML IJ SOLN
1000.0000 ug | Freq: Once | INTRAMUSCULAR | Status: AC
  Administered 2024-05-06: 1000 ug via INTRAMUSCULAR

## 2024-05-06 NOTE — Telephone Encounter (Signed)
 Hello,  Please disregard the previous message. My apologies.

## 2024-05-06 NOTE — Progress Notes (Signed)
 Pt here for monthly B12 injection per PCP  Last B12 injection:04/05/2024  Last B12 level: 11/10/2023  B12 1000mcg given IM L deltoid, and pt tolerated injection well.  Next B12 injection scheduled for: 06/08/2024

## 2024-06-08 ENCOUNTER — Ambulatory Visit

## 2024-06-08 DIAGNOSIS — E538 Deficiency of other specified B group vitamins: Secondary | ICD-10-CM | POA: Diagnosis not present

## 2024-06-08 MED ORDER — CYANOCOBALAMIN 1000 MCG/ML IJ SOLN
1000.0000 ug | Freq: Once | INTRAMUSCULAR | Status: AC
  Administered 2024-06-08: 15:00:00 1000 ug via INTRAMUSCULAR

## 2024-06-08 NOTE — Progress Notes (Signed)
 Patient is here for monthly B12 injection per original order dated: 11/12/2023 Per Dr. Amon B12 is low, recommend a injection weekly x 4 then monthly.  This has a potential to help with neuropathy.  Last B12 injection:05/06/2024  Last B12 level: 11/10/2023  B12 1000mcg given IM in Left deltoid, and patient tolerated injection well.  Next B12 injection scheduled for: January 20th, 2026 @ 3:00pm. Patient wanted to ask Dr. Amon if he wanted to add labwork (recheck B12) before his next appointment 07/22/2024

## 2024-07-12 ENCOUNTER — Ambulatory Visit

## 2024-07-14 ENCOUNTER — Ambulatory Visit

## 2024-07-14 ENCOUNTER — Other Ambulatory Visit

## 2024-07-14 DIAGNOSIS — E538 Deficiency of other specified B group vitamins: Secondary | ICD-10-CM | POA: Diagnosis not present

## 2024-07-14 MED ORDER — CYANOCOBALAMIN 1000 MCG/ML IJ SOLN
1000.0000 ug | Freq: Once | INTRAMUSCULAR | Status: AC
  Administered 2024-07-14: 1000 ug via INTRAMUSCULAR

## 2024-07-14 NOTE — Progress Notes (Signed)
 Pt here today for monthly B12 injection per Dr. Amon.   Cyanocobalamin  1mL injected L deltoid.   Next 1 month

## 2024-07-22 ENCOUNTER — Ambulatory Visit: Admitting: Internal Medicine

## 2024-07-27 ENCOUNTER — Telehealth: Payer: Self-pay | Admitting: *Deleted

## 2024-07-27 NOTE — Telephone Encounter (Signed)
 Pt was scheduled for AWV on 10/05/24 at 3:40.  Last appt stops at 3.  Moved appt to 3pm slot and sent mychart message to pt to let us  know if the knew time will not work for him.

## 2024-08-10 ENCOUNTER — Ambulatory Visit: Admitting: Internal Medicine

## 2024-08-11 ENCOUNTER — Ambulatory Visit

## 2024-10-05 ENCOUNTER — Ambulatory Visit
